# Patient Record
Sex: Female | Born: 1965 | Race: Asian
Health system: Southern US, Academic
[De-identification: ages and names within clinical notes are randomized; demographics above are authoritative.]

## PROBLEM LIST (undated history)

## (undated) ENCOUNTER — Encounter

## (undated) DIAGNOSIS — I10 Essential (primary) hypertension: Secondary | ICD-10-CM

## (undated) HISTORY — PX: VAGINAL WOUND CLOSURE / REPAIR: SUR258

## (undated) HISTORY — PX: EYE SURGERY: SHX253

---

## 1998-02-04 ENCOUNTER — Emergency Department (HOSPITAL_COMMUNITY): Admission: EM | Admit: 1998-02-04 | Discharge: 1998-02-04 | Payer: Self-pay | Admitting: Emergency Medicine

## 1998-07-29 ENCOUNTER — Other Ambulatory Visit: Admission: RE | Admit: 1998-07-29 | Discharge: 1998-07-29 | Payer: Self-pay | Admitting: Obstetrics

## 1999-07-19 ENCOUNTER — Emergency Department (HOSPITAL_COMMUNITY): Admission: EM | Admit: 1999-07-19 | Discharge: 1999-07-19 | Payer: Self-pay | Admitting: Emergency Medicine

## 2000-02-29 ENCOUNTER — Emergency Department (HOSPITAL_COMMUNITY): Admission: EM | Admit: 2000-02-29 | Discharge: 2000-02-29 | Payer: Self-pay | Admitting: Emergency Medicine

## 2000-03-18 ENCOUNTER — Encounter: Payer: Self-pay | Admitting: Emergency Medicine

## 2000-03-18 ENCOUNTER — Emergency Department (HOSPITAL_COMMUNITY): Admission: EM | Admit: 2000-03-18 | Discharge: 2000-03-18 | Payer: Self-pay | Admitting: Emergency Medicine

## 2000-06-13 ENCOUNTER — Emergency Department (HOSPITAL_COMMUNITY): Admission: EM | Admit: 2000-06-13 | Discharge: 2000-06-13 | Payer: Self-pay | Admitting: Emergency Medicine

## 2001-02-23 ENCOUNTER — Emergency Department (HOSPITAL_COMMUNITY): Admission: EM | Admit: 2001-02-23 | Discharge: 2001-02-23 | Payer: Self-pay | Admitting: *Deleted

## 2001-03-09 ENCOUNTER — Emergency Department (HOSPITAL_COMMUNITY): Admission: EM | Admit: 2001-03-09 | Discharge: 2001-03-09 | Payer: Self-pay | Admitting: *Deleted

## 2001-07-29 ENCOUNTER — Encounter: Payer: Self-pay | Admitting: Emergency Medicine

## 2001-07-29 ENCOUNTER — Emergency Department (HOSPITAL_COMMUNITY): Admission: EM | Admit: 2001-07-29 | Discharge: 2001-07-29 | Payer: Self-pay | Admitting: Emergency Medicine

## 2002-02-13 ENCOUNTER — Emergency Department (HOSPITAL_COMMUNITY): Admission: EM | Admit: 2002-02-13 | Discharge: 2002-02-13 | Payer: Self-pay | Admitting: Emergency Medicine

## 2002-02-18 ENCOUNTER — Emergency Department (HOSPITAL_COMMUNITY): Admission: EM | Admit: 2002-02-18 | Discharge: 2002-02-18 | Payer: Self-pay

## 2002-04-26 ENCOUNTER — Emergency Department (HOSPITAL_COMMUNITY): Admission: EM | Admit: 2002-04-26 | Discharge: 2002-04-26 | Payer: Self-pay | Admitting: Emergency Medicine

## 2002-08-24 ENCOUNTER — Emergency Department (HOSPITAL_COMMUNITY): Admission: EM | Admit: 2002-08-24 | Discharge: 2002-08-24 | Payer: Self-pay | Admitting: Emergency Medicine

## 2003-01-23 ENCOUNTER — Emergency Department (HOSPITAL_COMMUNITY): Admission: EM | Admit: 2003-01-23 | Discharge: 2003-01-23 | Payer: Self-pay | Admitting: Emergency Medicine

## 2003-04-18 ENCOUNTER — Inpatient Hospital Stay (HOSPITAL_COMMUNITY): Admission: AD | Admit: 2003-04-18 | Discharge: 2003-04-22 | Payer: Self-pay | Admitting: Obstetrics

## 2003-04-18 ENCOUNTER — Encounter: Payer: Self-pay | Admitting: Emergency Medicine

## 2003-04-19 ENCOUNTER — Encounter: Payer: Self-pay | Admitting: Obstetrics

## 2007-05-12 ENCOUNTER — Emergency Department (HOSPITAL_COMMUNITY): Admission: EM | Admit: 2007-05-12 | Discharge: 2007-05-12 | Payer: Self-pay | Admitting: Emergency Medicine

## 2007-06-21 ENCOUNTER — Emergency Department (HOSPITAL_COMMUNITY): Admission: EM | Admit: 2007-06-21 | Discharge: 2007-06-21 | Payer: Self-pay | Admitting: Emergency Medicine

## 2007-08-19 ENCOUNTER — Emergency Department (HOSPITAL_COMMUNITY): Admission: EM | Admit: 2007-08-19 | Discharge: 2007-08-19 | Payer: Self-pay | Admitting: Emergency Medicine

## 2008-01-05 ENCOUNTER — Emergency Department (HOSPITAL_COMMUNITY): Admission: EM | Admit: 2008-01-05 | Discharge: 2008-01-05 | Payer: Self-pay | Admitting: Emergency Medicine

## 2008-06-23 ENCOUNTER — Emergency Department (HOSPITAL_COMMUNITY): Admission: EM | Admit: 2008-06-23 | Discharge: 2008-06-23 | Payer: Self-pay | Admitting: Emergency Medicine

## 2008-06-27 ENCOUNTER — Emergency Department (HOSPITAL_COMMUNITY): Admission: EM | Admit: 2008-06-27 | Discharge: 2008-06-27 | Payer: Self-pay | Admitting: Emergency Medicine

## 2009-01-04 ENCOUNTER — Inpatient Hospital Stay (HOSPITAL_COMMUNITY): Admission: EM | Admit: 2009-01-04 | Discharge: 2009-01-06 | Payer: Self-pay | Admitting: Emergency Medicine

## 2009-02-07 ENCOUNTER — Emergency Department (HOSPITAL_COMMUNITY): Admission: EM | Admit: 2009-02-07 | Discharge: 2009-02-07 | Payer: Self-pay | Admitting: Emergency Medicine

## 2009-02-19 ENCOUNTER — Emergency Department (HOSPITAL_COMMUNITY): Admission: EM | Admit: 2009-02-19 | Discharge: 2009-02-19 | Payer: Self-pay | Admitting: Emergency Medicine

## 2009-02-28 ENCOUNTER — Emergency Department (HOSPITAL_COMMUNITY): Admission: EM | Admit: 2009-02-28 | Discharge: 2009-02-28 | Payer: Self-pay | Admitting: Emergency Medicine

## 2009-04-07 ENCOUNTER — Emergency Department (HOSPITAL_COMMUNITY): Admission: EM | Admit: 2009-04-07 | Discharge: 2009-04-07 | Payer: Self-pay | Admitting: Emergency Medicine

## 2009-05-03 ENCOUNTER — Inpatient Hospital Stay (HOSPITAL_COMMUNITY): Admission: EM | Admit: 2009-05-03 | Discharge: 2009-05-08 | Payer: Self-pay | Admitting: Emergency Medicine

## 2009-05-04 ENCOUNTER — Encounter (INDEPENDENT_AMBULATORY_CARE_PROVIDER_SITE_OTHER): Payer: Self-pay | Admitting: General Surgery

## 2009-07-10 ENCOUNTER — Emergency Department (HOSPITAL_COMMUNITY): Admission: EM | Admit: 2009-07-10 | Discharge: 2009-07-10 | Payer: Self-pay | Admitting: Emergency Medicine

## 2009-08-20 ENCOUNTER — Emergency Department (HOSPITAL_COMMUNITY): Admission: EM | Admit: 2009-08-20 | Discharge: 2009-08-20 | Payer: Self-pay | Admitting: Emergency Medicine

## 2009-09-08 ENCOUNTER — Emergency Department (HOSPITAL_COMMUNITY): Admission: EM | Admit: 2009-09-08 | Discharge: 2009-09-08 | Payer: Self-pay | Admitting: Emergency Medicine

## 2009-10-31 ENCOUNTER — Emergency Department (HOSPITAL_COMMUNITY): Admission: EM | Admit: 2009-10-31 | Discharge: 2009-10-31 | Payer: Self-pay | Admitting: Emergency Medicine

## 2009-11-29 ENCOUNTER — Emergency Department (HOSPITAL_COMMUNITY): Admission: EM | Admit: 2009-11-29 | Discharge: 2009-11-29 | Payer: Self-pay | Admitting: Emergency Medicine

## 2010-01-24 ENCOUNTER — Emergency Department (HOSPITAL_COMMUNITY): Admission: EM | Admit: 2010-01-24 | Discharge: 2010-01-24 | Payer: Self-pay | Admitting: Emergency Medicine

## 2010-03-17 ENCOUNTER — Emergency Department (HOSPITAL_COMMUNITY): Admission: EM | Admit: 2010-03-17 | Discharge: 2010-03-17 | Payer: Self-pay | Admitting: Emergency Medicine

## 2010-03-23 ENCOUNTER — Emergency Department (HOSPITAL_COMMUNITY): Admission: EM | Admit: 2010-03-23 | Discharge: 2010-03-24 | Payer: Self-pay | Admitting: Emergency Medicine

## 2010-04-01 ENCOUNTER — Emergency Department (HOSPITAL_COMMUNITY): Admission: EM | Admit: 2010-04-01 | Discharge: 2010-04-01 | Payer: Self-pay | Admitting: Emergency Medicine

## 2010-04-10 ENCOUNTER — Ambulatory Visit (HOSPITAL_COMMUNITY): Admission: RE | Admit: 2010-04-10 | Discharge: 2010-04-12 | Payer: Self-pay | Admitting: Otolaryngology

## 2010-04-23 ENCOUNTER — Emergency Department (HOSPITAL_COMMUNITY): Admission: EM | Admit: 2010-04-23 | Discharge: 2010-04-23 | Payer: Self-pay | Admitting: Emergency Medicine

## 2010-06-12 ENCOUNTER — Emergency Department (HOSPITAL_COMMUNITY): Admission: EM | Admit: 2010-06-12 | Discharge: 2010-06-12 | Payer: Self-pay | Admitting: Emergency Medicine

## 2010-08-30 ENCOUNTER — Emergency Department (HOSPITAL_COMMUNITY)
Admission: EM | Admit: 2010-08-30 | Discharge: 2010-08-30 | Payer: Self-pay | Source: Home / Self Care | Admitting: Emergency Medicine

## 2010-09-03 ENCOUNTER — Emergency Department (HOSPITAL_COMMUNITY)
Admission: EM | Admit: 2010-09-03 | Discharge: 2010-09-03 | Payer: Self-pay | Source: Home / Self Care | Admitting: Emergency Medicine

## 2010-09-20 ENCOUNTER — Emergency Department (HOSPITAL_COMMUNITY)
Admission: EM | Admit: 2010-09-20 | Discharge: 2010-09-20 | Payer: Self-pay | Source: Home / Self Care | Admitting: Emergency Medicine

## 2010-11-05 LAB — COMPREHENSIVE METABOLIC PANEL
AST: 21 U/L (ref 0–37)
Albumin: 3.5 g/dL (ref 3.5–5.2)
BUN: 7 mg/dL (ref 6–23)
Calcium: 8.6 mg/dL (ref 8.4–10.5)
Chloride: 111 mEq/L (ref 96–112)
Creatinine, Ser: 0.87 mg/dL (ref 0.4–1.2)
GFR calc Af Amer: >60 mL/min (ref >60)
Sodium: 138 mEq/L (ref 135–145)

## 2010-11-05 LAB — URINALYSIS, ROUTINE W REFLEX MICROSCOPIC
Bilirubin Urine: NEGATIVE
Ketones, ur: NEGATIVE mg/dL
Leukocytes, UA: NEGATIVE
Protein, ur: NEGATIVE mg/dL
Specific Gravity, Urine: 1.014 (ref 1.005–1.030)

## 2010-11-05 LAB — CBC
HCT: 27.4 % — ABNORMAL LOW (ref 36.0–46.0)
Hemoglobin: 8.8 g/dL — ABNORMAL LOW (ref 12.0–15.0)
MCHC: 32 g/dL (ref 30.0–36.0)
Platelets: 315 10*3/uL (ref 150–400)
RBC: 3.69 MIL/uL — ABNORMAL LOW (ref 3.87–5.11)
RDW: 20.5 % — ABNORMAL HIGH (ref 11.5–15.5)
WBC: 13.2 10*3/uL — ABNORMAL HIGH (ref 4.0–10.5)

## 2010-11-05 LAB — DIFFERENTIAL
Eosinophils Absolute: 0.2 10*3/uL (ref 0.0–0.7)
Eosinophils Relative: 1 % (ref 0–5)
Lymphocytes Relative: 19 % (ref 12–46)
Monocytes Absolute: 0.8 10*3/uL (ref 0.1–1.0)

## 2010-11-05 LAB — LIPASE, BLOOD: Lipase: 20 U/L (ref 11–59)

## 2010-11-05 LAB — URINE MICROSCOPIC-ADD ON

## 2010-11-06 LAB — DIFFERENTIAL
Band Neutrophils: 0 % (ref 0–10)
Basophils Absolute: 0 10*3/uL (ref 0.0–0.1)
Basophils Relative: 0 % (ref 0–1)
Blasts: 0 %
Eosinophils Absolute: 0.2 10*3/uL (ref 0.0–0.7)
Eosinophils Relative: 2 % (ref 0–5)
Lymphocytes Relative: 24 % (ref 12–46)
Lymphs Abs: 2.5 10*3/uL (ref 0.7–4.0)
Metamyelocytes Relative: 0 %
Monocytes Absolute: 0.7 10*3/uL (ref 0.1–1.0)
Monocytes Relative: 7 % (ref 3–12)
Myelocytes: 0 %
Neutro Abs: 7 10*3/uL (ref 1.7–7.7)
Neutrophils Relative %: 67 % (ref 43–77)
Promyelocytes Absolute: 0 %
nRBC: 0 /100 WBC

## 2010-11-06 LAB — GC/CHLAMYDIA PROBE AMP, GENITAL: GC Probe Amp, Genital: NEGATIVE

## 2010-11-06 LAB — BASIC METABOLIC PANEL
BUN: 10 mg/dL (ref 6–23)
CO2: 22 mEq/L (ref 19–32)
Calcium: 9.3 mg/dL (ref 8.4–10.5)
Chloride: 111 mEq/L (ref 96–112)
Creatinine, Ser: 0.89 mg/dL (ref 0.4–1.2)
GFR calc Af Amer: >60 mL/min (ref >60)
GFR calc non Af Amer: >60 mL/min (ref >60)
Glucose, Bld: 108 mg/dL — ABNORMAL HIGH (ref 70–99)
Potassium: 3.4 mEq/L — ABNORMAL LOW (ref 3.5–5.1)
Sodium: 140 mEq/L (ref 135–145)

## 2010-11-06 LAB — URINALYSIS, ROUTINE W REFLEX MICROSCOPIC
Glucose, UA: NEGATIVE mg/dL
Leukocytes, UA: NEGATIVE
Nitrite: NEGATIVE
Protein, ur: 30 mg/dL — AB
Specific Gravity, Urine: 1.03 (ref 1.005–1.030)
Urobilinogen, UA: 0.2 mg/dL (ref 0.0–1.0)
pH: 5.5 (ref 5.0–8.0)

## 2010-11-06 LAB — URINE MICROSCOPIC-ADD ON

## 2010-11-06 LAB — WET PREP, GENITAL
Trich, Wet Prep: NONE SEEN
Yeast Wet Prep HPF POC: NONE SEEN

## 2010-11-06 LAB — CBC
HCT: 32.1 % — ABNORMAL LOW (ref 36.0–46.0)
WBC: 10.4 10*3/uL (ref 4.0–10.5)

## 2010-11-07 LAB — BASIC METABOLIC PANEL
BUN: 6 mg/dL (ref 6–23)
Calcium: 9.3 mg/dL (ref 8.4–10.5)
Creatinine, Ser: 0.87 mg/dL (ref 0.4–1.2)
GFR calc non Af Amer: >60 mL/min (ref >60)
Glucose, Bld: 110 mg/dL — ABNORMAL HIGH (ref 70–99)
Sodium: 139 mEq/L (ref 135–145)

## 2010-11-07 LAB — SURGICAL PCR SCREEN
MRSA, PCR: NEGATIVE
Staphylococcus aureus: NEGATIVE

## 2010-11-07 LAB — CBC
MCHC: 31.4 g/dL (ref 30.0–36.0)
Platelets: 404 10*3/uL — ABNORMAL HIGH (ref 150–400)
RDW: 18.6 % — ABNORMAL HIGH (ref 11.5–15.5)

## 2010-11-08 LAB — URINE MICROSCOPIC-ADD ON

## 2010-11-08 LAB — URINALYSIS, ROUTINE W REFLEX MICROSCOPIC
Bilirubin Urine: NEGATIVE
Hgb urine dipstick: NEGATIVE
Specific Gravity, Urine: 1.018 (ref 1.005–1.030)
pH: 7.5 (ref 5.0–8.0)

## 2010-11-09 LAB — POCT CARDIAC MARKERS
CKMB, poc: 1 ng/mL (ref 1.0–8.0)
CKMB, poc: <1 ng/mL — ABNORMAL LOW (ref 1.0–8.0)
Myoglobin, poc: 42.6 ng/mL (ref 12–200)
Troponin i, poc: <0.05 ng/mL (ref 0.00–0.09)
Troponin i, poc: <0.05 ng/mL (ref 0.00–0.09)

## 2010-11-09 LAB — COMPREHENSIVE METABOLIC PANEL
ALT: 15 U/L (ref 0–35)
AST: 29 U/L (ref 0–37)
Alkaline Phosphatase: 66 U/L (ref 39–117)
CO2: 24 mEq/L (ref 19–32)
GFR calc Af Amer: >60 mL/min (ref >60)
Glucose, Bld: 94 mg/dL (ref 70–99)
Potassium: 4.2 mEq/L (ref 3.5–5.1)
Sodium: 134 mEq/L — ABNORMAL LOW (ref 135–145)
Total Protein: 7.7 g/dL (ref 6.0–8.3)

## 2010-11-09 LAB — DIFFERENTIAL
Basophils Relative: 0 % (ref 0–1)
Eosinophils Absolute: 0.1 10*3/uL (ref 0.0–0.7)
Lymphs Abs: 1.9 10*3/uL (ref 0.7–4.0)
Monocytes Absolute: 0.5 10*3/uL (ref 0.1–1.0)
Neutro Abs: 11 10*3/uL — ABNORMAL HIGH (ref 1.7–7.7)
Neutrophils Relative %: 81 % — ABNORMAL HIGH (ref 43–77)

## 2010-11-09 LAB — CBC
Hemoglobin: 9.8 g/dL — ABNORMAL LOW (ref 12.0–15.0)
RBC: 4.07 MIL/uL (ref 3.87–5.11)
RDW: 21.6 % — ABNORMAL HIGH (ref 11.5–15.5)

## 2010-11-09 LAB — D-DIMER, QUANTITATIVE: D-Dimer, Quant: 0.4 ug/mL-FEU (ref 0.00–0.48)

## 2010-11-26 LAB — GC/CHLAMYDIA PROBE AMP, GENITAL
Chlamydia, DNA Probe: NEGATIVE
GC Probe Amp, Genital: NEGATIVE

## 2010-11-26 LAB — WET PREP, GENITAL
Trich, Wet Prep: NONE SEEN
Yeast Wet Prep HPF POC: NONE SEEN

## 2010-11-28 LAB — CBC
HCT: 33.8 % — ABNORMAL LOW (ref 36.0–46.0)
Hemoglobin: 10.7 g/dL — ABNORMAL LOW (ref 12.0–15.0)
MCHC: 32 g/dL (ref 30.0–36.0)
MCV: 79.2 fL (ref 78.0–100.0)
Platelets: 278 10*3/uL (ref 150–400)
Platelets: 323 10*3/uL (ref 150–400)
RBC: 3.99 MIL/uL (ref 3.87–5.11)
RDW: 18.6 % — ABNORMAL HIGH (ref 11.5–15.5)
WBC: 10.9 10*3/uL — ABNORMAL HIGH (ref 4.0–10.5)
WBC: 11.6 10*3/uL — ABNORMAL HIGH (ref 4.0–10.5)

## 2010-11-28 LAB — ANAEROBIC CULTURE

## 2010-11-28 LAB — POCT I-STAT, CHEM 8
BUN: 10 mg/dL (ref 6–23)
Calcium, Ion: 1.14 mmol/L (ref 1.12–1.32)
Chloride: 109 mEq/L (ref 96–112)
HCT: 38 % (ref 36.0–46.0)
Sodium: 140 mEq/L (ref 135–145)
TCO2: 22 mmol/L (ref 0–100)

## 2010-11-28 LAB — DIFFERENTIAL
Basophils Absolute: 0.1 10*3/uL (ref 0.0–0.1)
Basophils Relative: 1 % (ref 0–1)
Lymphocytes Relative: 21 % (ref 12–46)
Monocytes Absolute: 1.2 10*3/uL — ABNORMAL HIGH (ref 0.1–1.0)
Neutro Abs: 13.2 10*3/uL — ABNORMAL HIGH (ref 1.7–7.7)

## 2010-11-28 LAB — CULTURE, ROUTINE-ABSCESS

## 2010-11-30 LAB — URINE CULTURE: Colony Count: >=100000

## 2010-11-30 LAB — URINALYSIS, ROUTINE W REFLEX MICROSCOPIC
Nitrite: NEGATIVE
Protein, ur: 30 mg/dL — AB

## 2010-12-01 LAB — CBC
MCHC: 32.7 g/dL (ref 30.0–36.0)
RBC: 4.36 MIL/uL (ref 3.87–5.11)
RDW: 16.2 % — ABNORMAL HIGH (ref 11.5–15.5)

## 2010-12-01 LAB — POCT I-STAT, CHEM 8
Creatinine, Ser: 1.1 mg/dL (ref 0.4–1.2)
Hemoglobin: 12.9 g/dL (ref 12.0–15.0)
Sodium: 141 mEq/L (ref 135–145)
TCO2: 19 mmol/L (ref 0–100)

## 2010-12-01 LAB — POCT PREGNANCY, URINE: Preg Test, Ur: NEGATIVE

## 2010-12-01 LAB — DIFFERENTIAL
Basophils Absolute: 0.2 10*3/uL — ABNORMAL HIGH (ref 0.0–0.1)
Basophils Relative: 1 % (ref 0–1)
Eosinophils Absolute: 0.2 10*3/uL (ref 0.0–0.7)
Lymphocytes Relative: 15 % (ref 12–46)
Neutrophils Relative %: 79 % — ABNORMAL HIGH (ref 43–77)

## 2010-12-01 LAB — URINALYSIS, ROUTINE W REFLEX MICROSCOPIC
Protein, ur: NEGATIVE mg/dL
Urobilinogen, UA: 0.2 mg/dL (ref 0.0–1.0)

## 2010-12-01 LAB — POCT CARDIAC MARKERS
CKMB, poc: 1.1 ng/mL (ref 1.0–8.0)
CKMB, poc: <1 ng/mL — ABNORMAL LOW (ref 1.0–8.0)
Myoglobin, poc: 56.1 ng/mL (ref 12–200)
Myoglobin, poc: 70 ng/mL (ref 12–200)
Troponin i, poc: <0.05 ng/mL (ref 0.00–0.09)
Troponin i, poc: <0.05 ng/mL (ref 0.00–0.09)

## 2010-12-01 LAB — D-DIMER, QUANTITATIVE
D-Dimer, Quant: 0.29 ug/mL-FEU (ref 0.00–0.48)
D-Dimer, Quant: 0.52 ug/mL-FEU — ABNORMAL HIGH (ref 0.00–0.48)

## 2010-12-01 LAB — URINE CULTURE: Colony Count: 3000

## 2010-12-01 LAB — BASIC METABOLIC PANEL
BUN: 7 mg/dL (ref 6–23)
Creatinine, Ser: 0.93 mg/dL (ref 0.4–1.2)
GFR calc non Af Amer: >60 mL/min (ref >60)

## 2010-12-01 LAB — URINE MICROSCOPIC-ADD ON

## 2010-12-01 LAB — PROTIME-INR: Prothrombin Time: 14.5 seconds (ref 11.6–15.2)

## 2010-12-02 LAB — CULTURE, ROUTINE-ABSCESS

## 2010-12-02 LAB — BASIC METABOLIC PANEL
BUN: 9 mg/dL (ref 6–23)
CO2: 23 mEq/L (ref 19–32)
Calcium: 8.8 mg/dL (ref 8.4–10.5)
Creatinine, Ser: 0.87 mg/dL (ref 0.4–1.2)
GFR calc Af Amer: >60 mL/min (ref >60)

## 2010-12-02 LAB — URINALYSIS, ROUTINE W REFLEX MICROSCOPIC
Glucose, UA: NEGATIVE mg/dL
Ketones, ur: NEGATIVE mg/dL
Nitrite: NEGATIVE
Protein, ur: NEGATIVE mg/dL
Urobilinogen, UA: 1 mg/dL (ref 0.0–1.0)

## 2010-12-02 LAB — CBC
MCHC: 32.6 g/dL (ref 30.0–36.0)
MCHC: 33.1 g/dL (ref 30.0–36.0)
MCV: 81.3 fL (ref 78.0–100.0)
Platelets: 256 10*3/uL (ref 150–400)
Platelets: 272 10*3/uL (ref 150–400)
RBC: 3.73 MIL/uL — ABNORMAL LOW (ref 3.87–5.11)
RBC: 3.94 MIL/uL (ref 3.87–5.11)
RDW: 17.8 % — ABNORMAL HIGH (ref 11.5–15.5)
WBC: 17.1 10*3/uL — ABNORMAL HIGH (ref 4.0–10.5)

## 2010-12-02 LAB — DIFFERENTIAL
Basophils Relative: 1 % (ref 0–1)
Eosinophils Absolute: 0.2 10*3/uL (ref 0.0–0.7)
Monocytes Relative: 5 % (ref 3–12)
Neutro Abs: 13.6 10*3/uL — ABNORMAL HIGH (ref 1.7–7.7)
Neutrophils Relative %: 80 % — ABNORMAL HIGH (ref 43–77)

## 2010-12-02 LAB — GRAM STAIN

## 2010-12-02 LAB — PREGNANCY, URINE: Preg Test, Ur: NEGATIVE

## 2010-12-02 LAB — GC/CHLAMYDIA PROBE AMP, URINE
Chlamydia, Swab/Urine, PCR: NEGATIVE
GC Probe Amp, Urine: NEGATIVE

## 2010-12-02 LAB — URINE MICROSCOPIC-ADD ON

## 2010-12-02 LAB — SAMPLE TO BLOOD BANK

## 2010-12-02 LAB — ANAEROBIC CULTURE

## 2010-12-10 ENCOUNTER — Emergency Department (HOSPITAL_COMMUNITY)
Admission: EM | Admit: 2010-12-10 | Discharge: 2010-12-10 | Disposition: A | Discharge status: Home / Self Care | Payer: Self-pay | Attending: Emergency Medicine | Admitting: Emergency Medicine

## 2010-12-10 DIAGNOSIS — R109 Unspecified abdominal pain: Secondary | ICD-10-CM | POA: Insufficient documentation

## 2010-12-10 DIAGNOSIS — I10 Essential (primary) hypertension: Secondary | ICD-10-CM | POA: Insufficient documentation

## 2010-12-10 DIAGNOSIS — R1110 Vomiting, unspecified: Secondary | ICD-10-CM | POA: Insufficient documentation

## 2010-12-10 DIAGNOSIS — K5289 Other specified noninfective gastroenteritis and colitis: Secondary | ICD-10-CM | POA: Insufficient documentation

## 2010-12-10 DIAGNOSIS — R197 Diarrhea, unspecified: Secondary | ICD-10-CM | POA: Insufficient documentation

## 2010-12-10 LAB — COMPREHENSIVE METABOLIC PANEL
ALT: 14 U/L (ref 0–35)
AST: 24 U/L (ref 0–37)
Albumin: 3.9 g/dL (ref 3.5–5.2)
Calcium: 8.7 mg/dL (ref 8.4–10.5)
Chloride: 110 mEq/L (ref 96–112)
Creatinine, Ser: 0.92 mg/dL (ref 0.4–1.2)
GFR calc Af Amer: >60 mL/min (ref >60)
Sodium: 143 mEq/L (ref 135–145)
Total Bilirubin: 0.4 mg/dL (ref 0.3–1.2)

## 2010-12-10 LAB — DIFFERENTIAL
Basophils Absolute: 0 10*3/uL (ref 0.0–0.1)
Basophils Relative: 0 % (ref 0–1)
Eosinophils Absolute: 0.1 10*3/uL (ref 0.0–0.7)
Monocytes Relative: 4 % (ref 3–12)
Neutro Abs: 9.1 10*3/uL — ABNORMAL HIGH (ref 1.7–7.7)
Neutrophils Relative %: 75 % (ref 43–77)

## 2010-12-10 LAB — CBC
Hemoglobin: 9.7 g/dL — ABNORMAL LOW (ref 12.0–15.0)
MCH: 23.4 pg — ABNORMAL LOW (ref 26.0–34.0)
Platelets: 321 10*3/uL (ref 150–400)
RBC: 4.15 MIL/uL (ref 3.87–5.11)

## 2011-01-02 ENCOUNTER — Emergency Department (HOSPITAL_COMMUNITY)
Admission: EM | Admit: 2011-01-02 | Discharge: 2011-01-02 | Disposition: A | Discharge status: Home / Self Care | Payer: Self-pay | Attending: Emergency Medicine | Admitting: Emergency Medicine

## 2011-01-02 DIAGNOSIS — K047 Periapical abscess without sinus: Secondary | ICD-10-CM | POA: Insufficient documentation

## 2011-01-02 DIAGNOSIS — K029 Dental caries, unspecified: Secondary | ICD-10-CM | POA: Insufficient documentation

## 2011-01-02 DIAGNOSIS — I10 Essential (primary) hypertension: Secondary | ICD-10-CM | POA: Insufficient documentation

## 2011-01-06 NOTE — Op Note (Signed)
NAMEKAYLONI, ROCCO                  ACCOUNT NO.:  [id number]   MEDICAL RECORD NO.:  [id number]          PATIENT TYPE:  INP   LOCATION:  0098                         FACILITY:  [facility]   PHYSICIAN:  Sharlet Salina T. Hoxworth, M.D.DATE OF BIRTH:  1966-07-10   DATE OF PROCEDURE:  01/04/2009  DATE OF DISCHARGE:  01/04/2009                               OPERATIVE REPORT   PREOPERATIVE DIAGNOSIS:  Perineal abscess.   POSTOPERATIVE DIAGNOSIS:  Perineal abscess.   SURGICAL PROCEDURES:  Incision and drainage of perineal abscess.   SURGEON:  Lorne Skeens. Hoxworth, M.D.   ANESTHESIA:  General.   BRIEF HISTORY:  Ms. Dutko is a 45 year old female who presents with a 4-  5 day history of increasing pain, swelling and tenderness in the left  perineum.  Physical exam and CT scan obtained from the emergency room  shows apparent large abscess just to the left of the vagina in the  perineum extending up into the labium on the left.  I have recommended  incision and drainage under general anesthesia in the operating room.  She is brought to the OR for this procedure.   DESCRIPTION OF OPERATION:  The patient was brought to the operating room  and placed in supine position on the operating table and general  anesthesia was induced.  She was carefully placed in lithotomy position.  The perineum widely sterilely prepped and draped.  She was already on  broad spectrum IV antibiotics preoperatively.  Correct patient and  procedure were verified.  Examination showed a large area of swelling  and fluctuance about 7 x 4 cm just to the left of vagina.  I made an  anterior posterior incision directly over this and entered a large  abscess cavity with foul-smelling purulent material.  This was cultured.  The abscess contents were completely evacuated and the incision was  extended somewhat for wide drainage.  There were a couple of loculations  up into the labia that were broken up.  The cavity was then thoroughly  irrigated.  Hemostasis was obtained with cautery.  The soft tissues were  infiltrated with Marcaine with epinephrine.  The cavity was then packed  with 1 inch Iodoform gauze.  Dry dressing was applied.  The patient  taken to recovery in good condition.      Lorne Skeens. Hoxworth, M.D.  Electronically Signed     BTH/MEDQ  D:  01/04/2009  T:  01/04/2009  Job:  [id number]

## 2011-01-06 NOTE — H&P (Signed)
NAMEHENDEL, GATLIFF                  ACCOUNT NO.:  [id number]   MEDICAL RECORD NO.:  [id number]          PATIENT TYPE:  INP   LOCATION:  0098                         FACILITY:  [facility]   PHYSICIAN:  Sharlet Salina T. Hoxworth, M.D.DATE OF BIRTH:  [birth year]   DATE OF ADMISSION:  01/04/2009  DATE OF DISCHARGE:  01/04/2009                              HISTORY & PHYSICAL   POSTOPERATIVE DIAGNOSIS:  Perineal abscess.   POSTOPERATIVE DIAGNOSIS:  Perineal abscess.   SURGICAL PROCEDURES:  Incision and drainage of perineal abscess.   SURGEON:  Lorne Skeens. Hoxworth, M.D.   ANESTHESIA:  General.   BRIEF HISTORY:  Leslie Golden is a 45 year old female generally in good  health except for hypertension.  She has a history of an apparent  superficial abscess in the left perineum drained in the emergency room  in October.  She had done well until about 4-5 days ago at which point  she has developed increasing pain and swelling just to the left of her  vagina.  She presents to the emergency room.  Denies fever or chills or  abdominal complaints.   PAST MEDICAL HISTORY:  Surgery only for the minor I and D as above.  Medically she has hypertension and has been prescribed propranolol but  does not take it due to the cost.   ALLERGIES:  None.   SOCIAL HISTORY:  She is employed as a Solicitor.  Smokes a pack of  cigarettes per day, drinks alcohol occasionally, single.   FAMILY HISTORY:  Noncontributory.   REVIEW OF SYSTEMS:  GENERAL:  No fever, chills or weight change.  RESPIRATORY:  Denies shortness of breath, cough, wheezing.  CARDIAC:  Denies chest pain, palpitations or history of heart disease.  GI:  No  abdominal pain, nausea, vomiting, problem with bowel movements.   PHYSICAL EXAM:  VITAL SIGNS:  Temperature is 97.1, pulse 90,  respirations 16, blood pressure 160/90.  GENERAL:  Well-developed African American female in no acute distress.  SKIN:  Warm and dry.  No rash or infection.  See GU.  HEENT:   No palpable mass or thyromegaly.  Sclerae nonicteric.  Oropharynx clear.  LYMPH NODES:  No cervical, subclavicular or inguinal nodes palpable.  LUNGS:  Clear without wheezing or increased work of breathing.  CARDIAC:  Regular rate and rhythm.  No murmurs.  ABDOMEN:  Soft, nontender, no masses.  GU:  Just to the left of the vagina in the perineum and extending up  into the left labium is a large about 6 x 4 cm area of swelling, marked  tenderness, mild erythema and fluctuance.  RECTOVAGINAL:  Exam not performed due to pain.  EXTREMITIES:  No joint swelling or deformity.  NEUROLOGIC:  Alert, oriented.  Motor and sensory exams grossly normal.   LABORATORY:  White count elevated at 17.1 thousand, hemoglobin 10.5.   CT scan of the pelvis was obtained by the emergency room which shows a  complex left perineal abscess.   ASSESSMENT AND PLAN:  Large recurrent left perineal abscess.  I have  recommended an incision and drainage in the operating  room under general  anesthesia.  She will receive broad spectrum preoperative IV  antibiotics.      Lorne Skeens. Hoxworth, M.D.  Electronically Signed     BTH/MEDQ  D:  01/04/2009  T:  01/04/2009  Job:  [id number]

## 2011-01-09 NOTE — Discharge Summary (Signed)
   NAME:  Leslie Golden, Leslie Golden                            ACCOUNT NO.:  [id number]   MEDICAL RECORD NO.:  [id number]                   PATIENT TYPE:  INP   LOCATION:  9144                                 FACILITY:  [facility]   PHYSICIAN:  Kathreen Cosier, M.D.           DATE OF BIRTH:  Nov 22, 1965   DATE OF ADMISSION:  04/18/2003  DATE OF DISCHARGE:  04/22/2003                                 DISCHARGE SUMMARY   HOSPITAL COURSE:  The patient is a 45 year old gravida 4, para 4-0-0-4 had a  tubal ligation in the past, last menstrual period April 02, 2003.  The  patient states that since Friday prior to admission she had pain which  started in her left lower quadrant and then spread all over her abdomen.  She had one episode of vomiting and diarrhea.  No bloody stools.  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  Her temp when she was  admitted here was 101 and her abdomen was extremely tender with guarding in  all quadrants.  She was started on ampicillin, gentamicin and she kept on  spiking temp up to 102.  Only thing positive was her urine.  Blood cultures  were done which were negative.  Her white count on admission was 29, her  hemoglobin 9.1.  Repeat white count two days later was 15 and on August 28th  it was 8.3.  She was seen in consult by the general surgeon who thought she  may have just had a bad case of pyelonephritis.  Her blood cultures were  negative.  Her ultrasound abdomen and pelvis showed a normal abdomen and  normal pelvis.  GC, Chlamydia, CLOtest were reported as being negative.  Cleocin was added to her antibiotic regimen on the 27th and then she rapidly  defervesced and by the 29th she had been without fever for greater than two  days.  Her abdomen was soft and she had no complaints.  She was discharged  home on April 22, 2003 on ampicillin 500 mg p.o. q.6 h. for 5 days, Motrin  for pain,  and Nizoral cream for fungal infection.   DISCHARGE DIAGNOSIS:  Status post hospitalization for pyelonephritis.                                               Kathreen Cosier, M.D.    BAM/MEDQ  D:  04/22/2003  T:  04/22/2003  Job:  [id number]

## 2011-01-09 NOTE — Consult Note (Signed)
[facility]  Patient:    Leslie Golden, Leslie Golden Visit Number: [id number] MRN: [id number]          Service Type: EMS Location: ED Attending Physician:  Armanda Heritage Dictated by:   Excell Seltzer. Annabell Howells, M.D. Proc. Date: 02/18/02 Admit Date:  02/18/2002 Discharge Date: 02/18/2002   CC:         Oswaldo Done K. Beverely Pace, M.D.  Director of Nursing, [facility]   Consultation Report  REFERRING PHYSICIAN:  Dr. Smitty Cords. Cheek.  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 Beverely Pace.  He discussed the case with me and I suggested she come to the office for followup, since I felt it would be best that the diverticulum be treated with antibiotics before considering surgery.  She had not had her appointment yet and came back in today with increased pain.  Dr. Beverely Pace contacted me and I came in to review the situation.  The patient reports onset earlier in the week of severe vaginal pain associated with some bloody purulent discharge.  She reported she had no problems urinating and no dysuria.  She had been on her period at the beginning of this and was unable to place a tampon.  ALLERGIES:  On review of her past history, she has no medication allergies.  MEDICATIONS:  She is only on pain medication and antibiotics from Dr. Beverely Pace, Vicodin and Levaquin.  PAST MEDICAL HISTORY:  She has had four pregnancies and four vaginal deliveries.  Medical history is otherwise unremarkable.  SOCIAL HISTORY:  She smokes, uses occasional alcohol.  Denies recreational drugs.  FAMILY HISTORY:  Unremarkable.  REVIEW OF SYSTEMS:  She denies fever or chills, abdominal pain or any urinary difficulties.  PHYSICAL EXAMINATION:  GENERAL:  She is a well-developed, thin black female in moderate distress, somewhat agitated with the  pain.  LUNGS:  Clear with normal effort.  HEART:  Somewhat tachycardic.  ABDOMEN:  Soft, flat and nontender without mass, lesion, hepatosplenomegaly or CVA tenderness.  No herniae or inguinal adenopathy are noted.  GU:  Exam reveals a protuberant suburethral mass which is exquisitely tender. I am not able to readily identify the urethral meatus but I believe it is distorted somewhat anteriorly and she was very uncomfortable with the exam.  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  PLAN:  I initially wrote an order for her to get some Dilaudid IV; prior order had been written for IM but I felt since she may require additional medication other than the one dose, that an IV would be more appropriate, and then recommended that we I&D this abscess.  The IV was placed using a 22 Angiocath in the left hand by the nurse.  The IV initially appeared to run slowly, the Dilaudid was injected, the skin was noted to bulge and it was felt the IV had infiltrated.  At this point, the decision was made to go ahead and do the IM injection, however, when we returned to perform the IM injection, the patient became quite agitated and abusive and stated that she was not going to let us touch her and began to make disparaging remarks about the nurse and myself and started using profanity, said she wanted to leave the hospital.  At this point, since she was stable, was in pain but otherwise had stable  vital signs and was not febrile, I had no cause to counter her decision.  I did explain to her that because of her behavior that I would not be willing to see her in any capacity and that she would need to go to [facility] for further evaluation if she were not content with our services.  The nurse I mentioned was Delia Heady, whose behavior was entirely appropriate throughout the patient contact. Dictated by:   Excell Seltzer. Annabell Howells, M.D. Attending Physician:  Armanda Heritage DD:  02/18/02 TD:  02/20/02 Job: [id number]

## 2011-02-14 ENCOUNTER — Emergency Department (HOSPITAL_COMMUNITY)
Admission: EM | Admit: 2011-02-14 | Discharge: 2011-02-14 | Disposition: A | Discharge status: Home / Self Care | Payer: Self-pay | Attending: Emergency Medicine | Admitting: Emergency Medicine

## 2011-02-14 DIAGNOSIS — R6883 Chills (without fever): Secondary | ICD-10-CM | POA: Insufficient documentation

## 2011-02-14 DIAGNOSIS — R220 Localized swelling, mass and lump, head: Secondary | ICD-10-CM | POA: Insufficient documentation

## 2011-02-14 DIAGNOSIS — R070 Pain in throat: Secondary | ICD-10-CM | POA: Insufficient documentation

## 2011-02-14 DIAGNOSIS — I10 Essential (primary) hypertension: Secondary | ICD-10-CM | POA: Insufficient documentation

## 2011-02-14 DIAGNOSIS — M542 Cervicalgia: Secondary | ICD-10-CM | POA: Insufficient documentation

## 2011-02-14 DIAGNOSIS — Z79899 Other long term (current) drug therapy: Secondary | ICD-10-CM | POA: Insufficient documentation

## 2011-02-14 DIAGNOSIS — H9209 Otalgia, unspecified ear: Secondary | ICD-10-CM | POA: Insufficient documentation

## 2011-02-14 DIAGNOSIS — R599 Enlarged lymph nodes, unspecified: Secondary | ICD-10-CM | POA: Insufficient documentation

## 2011-02-14 DIAGNOSIS — F319 Bipolar disorder, unspecified: Secondary | ICD-10-CM | POA: Insufficient documentation

## 2011-03-06 ENCOUNTER — Emergency Department (HOSPITAL_COMMUNITY)
Admission: EM | Admit: 2011-03-06 | Discharge: 2011-03-07 | Disposition: A | Discharge status: Home / Self Care | Payer: Self-pay | Attending: Emergency Medicine | Admitting: Emergency Medicine

## 2011-03-06 ENCOUNTER — Emergency Department (HOSPITAL_COMMUNITY): Payer: Self-pay

## 2011-03-06 DIAGNOSIS — K5289 Other specified noninfective gastroenteritis and colitis: Secondary | ICD-10-CM | POA: Insufficient documentation

## 2011-03-06 DIAGNOSIS — R1032 Left lower quadrant pain: Secondary | ICD-10-CM | POA: Insufficient documentation

## 2011-03-06 LAB — CBC
HCT: 29.4 % — ABNORMAL LOW (ref 36.0–46.0)
Hemoglobin: 9.3 g/dL — ABNORMAL LOW (ref 12.0–15.0)
MCH: 23.6 pg — ABNORMAL LOW (ref 26.0–34.0)
MCV: 74.6 fL — ABNORMAL LOW (ref 78.0–100.0)
RBC: 3.94 MIL/uL (ref 3.87–5.11)

## 2011-03-06 LAB — URINALYSIS, ROUTINE W REFLEX MICROSCOPIC
Glucose, UA: NEGATIVE mg/dL
Hgb urine dipstick: NEGATIVE
Leukocytes, UA: NEGATIVE
pH: 5.5 (ref 5.0–8.0)

## 2011-03-06 LAB — POCT I-STAT, CHEM 8
BUN: 9 mg/dL (ref 6–23)
Chloride: 108 mEq/L (ref 96–112)
Potassium: 3.5 mEq/L (ref 3.5–5.1)
Sodium: 140 mEq/L (ref 135–145)

## 2011-03-06 LAB — DIFFERENTIAL
Basophils Relative: 0 % (ref 0–1)
Lymphocytes Relative: 29 % (ref 12–46)
Lymphs Abs: 3.6 10*3/uL (ref 0.7–4.0)
Monocytes Relative: 7 % (ref 3–12)
Neutro Abs: 7.6 10*3/uL (ref 1.7–7.7)
Neutrophils Relative %: 62 % (ref 43–77)

## 2011-03-07 MED ORDER — IOHEXOL 300 MG/ML  SOLN
125.0000 mL | Freq: Once | INTRAMUSCULAR | Status: AC | PRN
  Administered 2011-03-07: 125 mL via INTRAVENOUS

## 2011-03-29 ENCOUNTER — Emergency Department (HOSPITAL_COMMUNITY)
Admission: EM | Admit: 2011-03-29 | Discharge: 2011-03-29 | Disposition: A | Discharge status: Home / Self Care | Payer: Self-pay | Attending: Emergency Medicine | Admitting: Emergency Medicine

## 2011-03-29 DIAGNOSIS — I10 Essential (primary) hypertension: Secondary | ICD-10-CM | POA: Insufficient documentation

## 2011-03-29 DIAGNOSIS — F319 Bipolar disorder, unspecified: Secondary | ICD-10-CM | POA: Insufficient documentation

## 2011-03-29 DIAGNOSIS — M25579 Pain in unspecified ankle and joints of unspecified foot: Secondary | ICD-10-CM | POA: Insufficient documentation

## 2011-03-29 DIAGNOSIS — M79609 Pain in unspecified limb: Secondary | ICD-10-CM | POA: Insufficient documentation

## 2011-03-29 DIAGNOSIS — Z79899 Other long term (current) drug therapy: Secondary | ICD-10-CM | POA: Insufficient documentation

## 2011-05-20 LAB — RAPID STREP SCREEN (MED CTR MEBANE ONLY): Streptococcus, Group A Screen (Direct): NEGATIVE

## 2011-06-26 ENCOUNTER — Emergency Department (HOSPITAL_COMMUNITY)
Admission: EM | Admit: 2011-06-26 | Discharge: 2011-06-26 | Disposition: A | Discharge status: Home / Self Care | Payer: Self-pay | Attending: Emergency Medicine | Admitting: Emergency Medicine

## 2011-06-26 ENCOUNTER — Emergency Department (HOSPITAL_COMMUNITY): Payer: Self-pay

## 2011-06-26 DIAGNOSIS — J322 Chronic ethmoidal sinusitis: Secondary | ICD-10-CM | POA: Insufficient documentation

## 2011-06-26 DIAGNOSIS — I10 Essential (primary) hypertension: Secondary | ICD-10-CM | POA: Insufficient documentation

## 2011-06-26 DIAGNOSIS — G43909 Migraine, unspecified, not intractable, without status migrainosus: Secondary | ICD-10-CM | POA: Insufficient documentation

## 2011-06-26 LAB — DIFFERENTIAL
Eosinophils Relative: 1 % (ref 0–5)
Lymphocytes Relative: 22 % (ref 12–46)
Lymphs Abs: 2.7 10*3/uL (ref 0.7–4.0)

## 2011-06-26 LAB — BASIC METABOLIC PANEL
BUN: 10 mg/dL (ref 6–23)
CO2: 23 mEq/L (ref 19–32)
Chloride: 101 mEq/L (ref 96–112)
Creatinine, Ser: 0.84 mg/dL (ref 0.50–1.10)
GFR calc Af Amer: >90 mL/min (ref >90)

## 2011-06-26 LAB — CBC
HCT: 33.7 % — ABNORMAL LOW (ref 36.0–46.0)
MCV: 74.7 fL — ABNORMAL LOW (ref 78.0–100.0)
RDW: 19.3 % — ABNORMAL HIGH (ref 11.5–15.5)
WBC: 12.5 10*3/uL — ABNORMAL HIGH (ref 4.0–10.5)

## 2011-08-03 ENCOUNTER — Encounter: Payer: Self-pay | Admitting: *Deleted

## 2011-08-03 ENCOUNTER — Emergency Department (HOSPITAL_COMMUNITY)
Admission: EM | Admit: 2011-08-03 | Discharge: 2011-08-03 | Discharge status: Against Medical Advice | Payer: Self-pay | Attending: Emergency Medicine | Admitting: Emergency Medicine

## 2011-08-03 DIAGNOSIS — M549 Dorsalgia, unspecified: Secondary | ICD-10-CM | POA: Insufficient documentation

## 2011-08-03 HISTORY — DX: Essential (primary) hypertension: I10

## 2011-08-04 ENCOUNTER — Emergency Department (HOSPITAL_COMMUNITY)
Admission: EM | Admit: 2011-08-04 | Discharge: 2011-08-04 | Disposition: A | Discharge status: Home / Self Care | Payer: Self-pay | Attending: Emergency Medicine | Admitting: Emergency Medicine

## 2011-08-04 ENCOUNTER — Encounter (HOSPITAL_COMMUNITY): Payer: Self-pay | Admitting: *Deleted

## 2011-08-04 DIAGNOSIS — R1110 Vomiting, unspecified: Secondary | ICD-10-CM | POA: Insufficient documentation

## 2011-08-04 DIAGNOSIS — I10 Essential (primary) hypertension: Secondary | ICD-10-CM | POA: Insufficient documentation

## 2011-08-04 DIAGNOSIS — M5380 Other specified dorsopathies, site unspecified: Secondary | ICD-10-CM | POA: Insufficient documentation

## 2011-08-04 DIAGNOSIS — M62830 Muscle spasm of back: Secondary | ICD-10-CM

## 2011-08-04 MED ORDER — DIAZEPAM 5 MG PO TABS
ORAL_TABLET | ORAL | Status: AC
  Filled 2011-08-04: qty 1

## 2011-08-04 MED ORDER — DIAZEPAM 2 MG PO TABS
2.0000 mg | ORAL_TABLET | Freq: Once | ORAL | Status: AC
  Administered 2011-08-04: 2 mg via ORAL

## 2011-08-04 MED ORDER — OXYCODONE-ACETAMINOPHEN 5-325 MG PO TABS
1.0000 | ORAL_TABLET | Freq: Once | ORAL | Status: AC
  Administered 2011-08-04: 1 via ORAL
  Filled 2011-08-04: qty 1

## 2011-08-04 MED ORDER — CYCLOBENZAPRINE HCL 10 MG PO TABS
5.0000 mg | ORAL_TABLET | Freq: Once | ORAL | Status: AC
  Administered 2011-08-04: 10 mg via ORAL
  Filled 2011-08-04: qty 1

## 2011-08-04 MED ORDER — KETOROLAC TROMETHAMINE 60 MG/2ML IM SOLN
60.0000 mg | Freq: Once | INTRAMUSCULAR | Status: AC
  Administered 2011-08-04: 60 mg via INTRAMUSCULAR
  Filled 2011-08-04: qty 2

## 2011-08-04 MED ORDER — METHOCARBAMOL 500 MG PO TABS: 500.0000 mg | ORAL_TABLET | Freq: Two times a day (BID) | ORAL | Status: AC

## 2011-08-04 MED ORDER — OXYCODONE-ACETAMINOPHEN 5-325 MG PO TABS: 1.0000 | ORAL_TABLET | Freq: Four times a day (QID) | ORAL | Status: AC | PRN

## 2011-08-04 NOTE — ED Provider Notes (Signed)
Medical screening examination/treatment/procedure(s) were performed by non-physician practitioner and as supervising physician I was immediately available for consultation/collaboration.  Juliet Rude. Rubin Payor, MD 08/04/11 [phone]

## 2011-08-04 NOTE — ED Notes (Signed)
Pt reports mid and low back spasms since last week, was here last night but not seen due to wait. Has only taken 800mg  ibuprofen at home.

## 2011-08-04 NOTE — ED Provider Notes (Signed)
History     CSN: [id number] Arrival date & time: 08/04/2011 11:17 AM   First MD Initiated Contact with Patient 08/04/11 1231      Chief Complaint  Patient presents with  . Spasms  . Muscle Pain    (Consider location/radiation/quality/duration/timing/severity/associated sxs/prior treatment) Patient is a 45 y.o. female presenting with musculoskeletal pain. The history is provided by the patient.  Muscle Pain This is a new problem. The current episode started in the past 7 days. The problem occurs constantly. The problem has been unchanged. Associated symptoms include vomiting. Pertinent negatives include no abdominal pain, anorexia, arthralgias, chest pain, chills, coughing, diaphoresis, fever, joint swelling or rash. The symptoms are aggravated by coughing, twisting and walking. She has tried acetaminophen and NSAIDs for the symptoms. The treatment provided mild relief.   Pt has a history of back spasms and has been having spasms that go from her right side then back to her left side alternating. She came to ED yesterday but left due to the wait time. Pt is ambulatory and denies any loose of motor function or sensory function.    Past Medical History  Diagnosis Date  . Hypertension     History reviewed. No pertinent past surgical history.  No family history on file.  History  Substance Use Topics  . Smoking status: Current Everyday Smoker    Types: Cigarettes  . Smokeless tobacco: Not on file  . Alcohol Use: Yes     occ    OB History    Grav Para Term Preterm Abortions TAB SAB Ect Mult Living                  Review of Systems  Constitutional: Negative for fever, chills and diaphoresis.  Respiratory: Negative for cough.   Cardiovascular: Negative for chest pain.  Gastrointestinal: Positive for vomiting. Negative for abdominal pain and anorexia.  Musculoskeletal: Negative for joint swelling and arthralgias.  Skin: Negative for rash.  All other systems reviewed and  are negative.    Allergies  Review of patient's allergies indicates no known allergies.  Home Medications   Current Outpatient Rx  Name Route Sig Dispense Refill  . IBUPROFEN 200 MG PO TABS Oral Take 800 mg by mouth every 6 (six) hours as needed. For pain.     Marland Kitchen MIRTAZAPINE 15 MG PO TABS Oral Take 15 mg by mouth at bedtime.      Carma Leaven M PLUS PO TABS Oral Take 1 tablet by mouth daily.      Marland Kitchen PROPRANOLOL HCL 20 MG PO TABS Oral Take 20 mg by mouth 2 (two) times daily.        BP 166/104  Pulse 74  Temp(Src) 98.4 F (36.9 C) (Oral)  Resp 16  SpO2 100%  LMP 07/23/2011  Physical Exam  Nursing note and vitals reviewed. Constitutional: She appears well-developed and well-nourished.  HENT:  Head: Normocephalic and atraumatic.  Eyes: Conjunctivae are normal. Pupils are equal, round, and reactive to light.  Neck: Trachea normal, normal range of motion and full passive range of motion without pain. Neck supple.  Cardiovascular: Normal rate, regular rhythm and normal pulses.   Pulmonary/Chest: Effort normal and breath sounds normal. Chest wall is not dull to percussion. She exhibits no tenderness, no crepitus, no edema, no deformity and no retraction.  Abdominal: Normal appearance.  Musculoskeletal:       Arms: Neurological: She is alert. She has normal strength.  Skin: Skin is warm, dry and intact.  Psychiatric:  She has a normal mood and affect. Her speech is normal and behavior is normal. Judgment and thought content normal. Cognition and memory are normal.    ED Course  Procedures (including critical care time)  Labs Reviewed - No data to display No results found.   1. Muscle spasm of back       MDM  pts pain resolved with treatment in the ED. Discussed treatment plan with pt and she is agreeable.        Dorthula Matas, PA 08/04/11 1359

## 2011-08-06 NOTE — ED Notes (Signed)
Pt. Called wanting to have work note changed. Continues to have back spasms. Chart taken to [employer] PA. She stated that pt needs to follow up this orthopedics as stated in DC papers. If can not go to ortho may return to [facility] or ED. No changes made to work note.

## 2011-08-26 ENCOUNTER — Encounter (HOSPITAL_COMMUNITY): Payer: Self-pay | Admitting: *Deleted

## 2011-08-26 ENCOUNTER — Emergency Department (HOSPITAL_COMMUNITY)
Admission: EM | Admit: 2011-08-26 | Discharge: 2011-08-26 | Disposition: A | Discharge status: Home / Self Care | Payer: Self-pay | Attending: Emergency Medicine | Admitting: Emergency Medicine

## 2011-08-26 DIAGNOSIS — M549 Dorsalgia, unspecified: Secondary | ICD-10-CM

## 2011-08-26 DIAGNOSIS — M79609 Pain in unspecified limb: Secondary | ICD-10-CM | POA: Insufficient documentation

## 2011-08-26 DIAGNOSIS — M545 Low back pain, unspecified: Secondary | ICD-10-CM | POA: Insufficient documentation

## 2011-08-26 DIAGNOSIS — Z79899 Other long term (current) drug therapy: Secondary | ICD-10-CM | POA: Insufficient documentation

## 2011-08-26 DIAGNOSIS — I10 Essential (primary) hypertension: Secondary | ICD-10-CM | POA: Insufficient documentation

## 2011-08-26 MED ORDER — CYCLOBENZAPRINE HCL 10 MG PO TABS: 10.0000 mg | ORAL_TABLET | Freq: Three times a day (TID) | ORAL | Status: AC | PRN

## 2011-08-26 MED ORDER — IBUPROFEN 800 MG PO TABS: 800.0000 mg | ORAL_TABLET | Freq: Three times a day (TID) | ORAL | Status: AC

## 2011-08-26 MED ORDER — CYCLOBENZAPRINE HCL 10 MG PO TABS
10.0000 mg | ORAL_TABLET | Freq: Once | ORAL | Status: DC
  Filled 2011-08-26: qty 1

## 2011-08-26 NOTE — ED Notes (Signed)
Pt wanted name of dr that saw her
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
## 2011-08-26 NOTE — ED Notes (Signed)
Pt refused flexeril

## 2011-08-26 NOTE — ED Notes (Signed)
Patient states she woke up with back spasms 2 nights ago.  She continues to have pain.  She states her pain is worse at times.

## 2011-08-28 NOTE — ED Provider Notes (Signed)
Medical screening examination/treatment/procedure(s) were performed by non-physician practitioner and as supervising physician I was immediately available for consultation/collaboration.   Forbes Cellar, MD 08/28/11 [phone]

## 2011-11-05 ENCOUNTER — Encounter (HOSPITAL_COMMUNITY): Payer: Self-pay

## 2011-11-05 ENCOUNTER — Emergency Department (HOSPITAL_COMMUNITY)
Admission: EM | Admit: 2011-11-05 | Discharge: 2011-11-05 | Disposition: A | Discharge status: Home / Self Care | Payer: Self-pay | Attending: Emergency Medicine | Admitting: Emergency Medicine

## 2011-11-05 DIAGNOSIS — R1110 Vomiting, unspecified: Secondary | ICD-10-CM | POA: Insufficient documentation

## 2011-11-05 DIAGNOSIS — R197 Diarrhea, unspecified: Secondary | ICD-10-CM | POA: Insufficient documentation

## 2011-11-05 DIAGNOSIS — K5289 Other specified noninfective gastroenteritis and colitis: Secondary | ICD-10-CM | POA: Insufficient documentation

## 2011-11-05 DIAGNOSIS — F17200 Nicotine dependence, unspecified, uncomplicated: Secondary | ICD-10-CM | POA: Insufficient documentation

## 2011-11-05 DIAGNOSIS — K529 Noninfective gastroenteritis and colitis, unspecified: Secondary | ICD-10-CM

## 2011-11-05 DIAGNOSIS — R109 Unspecified abdominal pain: Secondary | ICD-10-CM | POA: Insufficient documentation

## 2011-11-05 DIAGNOSIS — I10 Essential (primary) hypertension: Secondary | ICD-10-CM | POA: Insufficient documentation

## 2011-11-05 LAB — COMPREHENSIVE METABOLIC PANEL
ALT: 9 U/L (ref 0–35)
Alkaline Phosphatase: 68 U/L (ref 39–117)
BUN: 9 mg/dL (ref 6–23)
CO2: 26 mEq/L (ref 19–32)
GFR calc Af Amer: >90 mL/min (ref >90)
GFR calc non Af Amer: >90 mL/min (ref >90)
Glucose, Bld: 84 mg/dL (ref 70–99)
Potassium: 3.8 mEq/L (ref 3.5–5.1)
Sodium: 138 mEq/L (ref 135–145)
Total Bilirubin: 0.2 mg/dL — ABNORMAL LOW (ref 0.3–1.2)

## 2011-11-05 LAB — DIFFERENTIAL
Eosinophils Relative: 1 % (ref 0–5)
Lymphocytes Relative: 24 % (ref 12–46)
Lymphs Abs: 2.5 10*3/uL (ref 0.7–4.0)
Monocytes Relative: 7 % (ref 3–12)
Neutrophils Relative %: 67 % (ref 43–77)

## 2011-11-05 LAB — URINALYSIS, ROUTINE W REFLEX MICROSCOPIC
Bilirubin Urine: NEGATIVE
Hgb urine dipstick: NEGATIVE
Ketones, ur: NEGATIVE mg/dL
Protein, ur: NEGATIVE mg/dL
Urobilinogen, UA: 0.2 mg/dL (ref 0.0–1.0)

## 2011-11-05 LAB — CBC
Hemoglobin: 9.8 g/dL — ABNORMAL LOW (ref 12.0–15.0)
MCH: 23.7 pg — ABNORMAL LOW (ref 26.0–34.0)
MCV: 73.2 fL — ABNORMAL LOW (ref 78.0–100.0)
Platelets: 309 10*3/uL (ref 150–400)
RBC: 4.14 MIL/uL (ref 3.87–5.11)
WBC: 10.4 10*3/uL (ref 4.0–10.5)

## 2011-11-05 LAB — LIPASE, BLOOD: Lipase: 26 U/L (ref 11–59)

## 2011-11-05 MED ORDER — ONDANSETRON HCL 4 MG/2ML IJ SOLN
4.0000 mg | Freq: Once | INTRAMUSCULAR | Status: AC
  Administered 2011-11-05: 4 mg via INTRAVENOUS
  Filled 2011-11-05: qty 2

## 2011-11-05 MED ORDER — PROMETHAZINE HCL 25 MG PO TABS: 25.0000 mg | ORAL_TABLET | Freq: Four times a day (QID) | ORAL | Status: DC | PRN

## 2011-11-05 MED ORDER — SODIUM CHLORIDE 0.9 % IV BOLUS (SEPSIS)
1000.0000 mL | Freq: Once | INTRAVENOUS | Status: AC
  Administered 2011-11-05: 1000 mL via INTRAVENOUS

## 2011-11-05 NOTE — ED Notes (Signed)
Pt c/o n/v/d x2 days with abdominal cramping

## 2011-11-05 NOTE — Discharge Instructions (Signed)
B.R.A.T. Diet Your doctor has recommended the B.R.A.T. diet for you or your child until the condition improves. This is often used to help control diarrhea and vomiting symptoms. If you or your child can tolerate clear liquids, you may have:  Bananas.   Rice.   Applesauce.   Toast (and other simple starches such as crackers, potatoes, noodles).  Be sure to avoid dairy products, meats, and fatty foods until symptoms are better. Fruit juices such as apple, grape, and prune juice can make diarrhea worse. Avoid these. Continue this diet for 2 days or as instructed by your caregiver. Document Released: 08/10/2005 Document Revised: 07/30/2011 Document Reviewed: 01/27/2007 ExitCare Patient Information 2012 [employer].Clear Liquid Diet The clear liquid dietconsists of foods that are liquid or will become liquid at room temperature.You should be able to see through the liquid and beverages. Examples of foods allowed on a clear liquid diet include fruit juice, broth or bouillon, gelatin, or frozen ice pops. The purpose of this diet is to provide necessary fluid, electrolytes such as sodium and potassium, and energy to keep the body functioning during times when you are not able to consume a regular diet.A clear liquid diet should not be continued for long periods of time as it is not nutritionally adequate.  REASONS FOR USING A CLEAR LIQUID DIET  In sudden onset (acute) conditions for a patient before or after surgery.   As the first step in oral feeding.   For fluid and electrolyte replacement in diarrheal diseases.   As a diet before certain medical tests are performed.  ADEQUACY The clear liquid diet is adequate only in ascorbic acid, according to the Recommended Dietary Allowances of the National Research Council. CHOOSING FOODS Breads and Starches  Allowed:  None are allowed.   Avoid: All are avoided.  Vegetables  Allowed:  Strained tomato or vegetable juice.   Avoid: Any  others.  Fruit  Allowed:  Strained fruit juices and fruit drinks. Include 1 serving of citrus or vitamin C-enriched fruit juice daily.   Avoid: Any others.  Meat and Meat Substitutes  Allowed:  None are allowed.   Avoid: All are avoided.  Milk  Allowed:  None are allowed.   Avoid: All are avoided.  Soups and Combination Foods  Allowed:  Clear bouillon, broth, or strained broth-based soups.   Avoid: Any others.  Desserts and Sweets  Allowed:  Sugar, honey. High protein gelatin. Flavored gelatin, ices, or frozen ice pops that do not contain milk.   Avoid: Any others.  Fats and Oils  Allowed:  None are allowed.   Avoid: All are avoided.  Beverages  Allowed: Cereal beverages, coffee (regular or decaffeinated), tea, or soda at the discretion of your caregiver.   Avoid: Any others.  Condiments  Allowed:  Iodized salt.   Avoid: Any others, including pepper.  Supplements  Allowed:  Liquid nutrition beverages.   Avoid: Any others that contain lactose or fiber.  SAMPLE MEAL PLAN Breakfast  4 oz (120 mL) strained orange juice.    to 1 cup (125 to 250 mL) gelatin (plain or fortified).   1 cup (250 mL) beverage (coffee or tea).   Sugar, if desired.  Midmorning Snack   cup (125 mL) gelatin (plain or fortified).  Lunch  1 cup (250 mL) broth or consomm.   4 oz (120 mL) strained grapefruit juice.    cup (125 mL) gelatin (plain or fortified).   1 cup (250 mL) beverage (coffee or tea).     Sugar, if desired.  Midafternoon Snack   cup (125 mL) fruit ice.    cup (125 mL) strained fruit juice.  Dinner  1 cup (250 mL) broth or consomm.    cup (125 mL) cranberry juice.    cup (125 mL) flavored gelatin (plain or fortified).   1 cup (250 mL) beverage (coffee or tea).   Sugar, if desired.  Evening Snack  4 oz (120 mL) strained apple juice (vitamin C-fortified).    cup (125 mL) flavored gelatin (plain or fortified).  Document Released: 08/10/2005  Document Revised: 07/30/2011 Document Reviewed: 11/07/2010 ExitCare Patient Information 2012 [employer].Viral Gastroenteritis Viral gastroenteritis is also known as stomach flu. This condition affects the stomach and intestinal tract. It can cause sudden diarrhea and vomiting. The illness typically lasts 3 to 8 days. Most people develop an immune response that eventually gets rid of the virus. While this natural response develops, the virus can make you quite ill. CAUSES  Many different viruses can cause gastroenteritis, such as rotavirus or noroviruses. You can catch one of these viruses by consuming contaminated food or water. You may also catch a virus by sharing utensils or other personal items with an infected person or by touching a contaminated surface. SYMPTOMS  The most common symptoms are diarrhea and vomiting. These problems can cause a severe loss of body fluids (dehydration) and a body salt (electrolyte) imbalance. Other symptoms may include:  Fever.   Headache.   Fatigue.   Abdominal pain.  DIAGNOSIS  Your caregiver can usually diagnose viral gastroenteritis based on your symptoms and a physical exam. A stool sample may also be taken to test for the presence of viruses or other infections. TREATMENT  This illness typically goes away on its own. Treatments are aimed at rehydration. The most serious cases of viral gastroenteritis involve vomiting so severely that you are not able to keep fluids down. In these cases, fluids must be given through an intravenous line (IV). HOME CARE INSTRUCTIONS   Drink enough fluids to keep your urine clear or pale yellow. Drink small amounts of fluids frequently and increase the amounts as tolerated.   Ask your caregiver for specific rehydration instructions.   Avoid:   Foods high in sugar.   Alcohol.   Carbonated drinks.   Tobacco.   Juice.   Caffeine drinks.   Extremely hot or cold fluids.   Fatty, greasy foods.   Too much  intake of anything at one time.   Dairy products until 24 to 48 hours after diarrhea stops.   You may consume probiotics. Probiotics are active cultures of beneficial bacteria. They may lessen the amount and number of diarrheal stools in adults. Probiotics can be found in yogurt with active cultures and in supplements.   Wash your hands well to avoid spreading the virus.   Only take over-the-counter or prescription medicines for pain, discomfort, or fever as directed by your caregiver. Do not give aspirin to children. Antidiarrheal medicines are not recommended.   Ask your caregiver if you should continue to take your regular prescribed and over-the-counter medicines.   Keep all follow-up appointments as directed by your caregiver.  SEEK IMMEDIATE MEDICAL CARE IF:   You are unable to keep fluids down.   You do not urinate at least once every 6 to 8 hours.   You develop shortness of breath.   You notice blood in your stool or vomit. This may look like coffee grounds.   You have abdominal pain that   increases or is concentrated in one small area (localized).   You have persistent vomiting or diarrhea.   You have a fever.   The patient is a child younger than 3 months, and he or she has a fever.   The patient is a child older than 3 months, and he or she has a fever and persistent symptoms.   The patient is a child older than 3 months, and he or she has a fever and symptoms suddenly get worse.   The patient is a baby, and he or she has no tears when crying.  MAKE SURE YOU:   Understand these instructions.   Will watch your condition.   Will get help right away if you are not doing well or get worse.  Document Released: 08/10/2005 Document Revised: 07/30/2011 Document Reviewed: 05/27/2011 Exit[employer] Patient Information 2012 [employer]. 

## 2011-11-05 NOTE — ED Provider Notes (Signed)
History     CSN: [id number]  Arrival date & time 11/05/11  1027   First MD Initiated Contact with Patient 11/05/11 1034      Chief Complaint  Patient presents with  . Vomiting  . Diarrhea  . Abdominal Cramping    (Consider location/radiation/quality/duration/timing/severity/associated sxs/prior treatment) HPI Comments: Patient here with gradual onset 2 days ago of crampy abdominal pain with associated nausea, vomiting and diarrhea - states no recent sick contacts that she knows of but states that now her boyfriend has the same symptoms.  Reports fever and chills, no focality to the abdominal pain - states noticed blood specs in her vomit today.  Reports pain is just before and episode of diarrhea or vomiting - denies blood in the diarrhea - reports that this is watery.  Is unable to keep down fluids at home.  Patient is a 46 y.o. female presenting with diarrhea and cramps. The history is provided by the patient. No language interpreter was used.  Diarrhea The primary symptoms include fever, abdominal pain, nausea, vomiting, diarrhea and hematemesis. Primary symptoms do not include weight loss, fatigue, melena, jaundice, hematochezia, dysuria, myalgias, arthralgias or rash. The illness began 2 days ago. The onset was gradual. The problem has been gradually worsening.  The illness is also significant for anorexia. The illness does not include chills, dysphagia, odynophagia, bloating, constipation, tenesmus, back pain or itching. Associated medical issues do not include inflammatory bowel disease, gallstones, liver disease, alcohol abuse, PUD, bowel resection, irritable bowel syndrome or diverticulitis.  Abdominal Cramping The primary symptoms of the illness include abdominal pain, fever, nausea, vomiting, diarrhea and hematemesis. The primary symptoms of the illness do not include fatigue, hematochezia or dysuria.  Additional symptoms associated with the illness include anorexia. Symptoms  associated with the illness do not include chills, constipation, hematuria or back pain. Significant associated medical issues do not include PUD, inflammatory bowel disease, gallstones, liver disease or diverticulitis.    Past Medical History  Diagnosis Date  . Hypertension     Past Surgical History  Procedure Date  . Eye surgery     No family history on file.  History  Substance Use Topics  . Smoking status: Current Everyday Smoker    Types: Cigarettes  . Smokeless tobacco: Not on file  . Alcohol Use: Yes     occ    OB History    Grav Para Term Preterm Abortions TAB SAB Ect Mult Living                  Review of Systems  Constitutional: Positive for fever. Negative for chills, weight loss and fatigue.  Gastrointestinal: Positive for nausea, vomiting, abdominal pain, diarrhea, anorexia and hematemesis. Negative for dysphagia, constipation, blood in stool, melena, hematochezia, bloating and jaundice.  Genitourinary: Negative for dysuria and hematuria.  Musculoskeletal: Negative for myalgias, back pain and arthralgias.  Skin: Negative for itching and rash.  All other systems reviewed and are negative.    Allergies  Review of patient's allergies indicates no known allergies.  Home Medications   Current Outpatient Rx  Name Route Sig Dispense Refill  . PROPRANOLOL HCL 20 MG PO TABS Oral Take 20 mg by mouth 2 (two) times daily.        BP 132/96  Pulse 100  Temp(Src) 99.4 F (37.4 C) (Oral)  Resp 100  SpO2 100%  LMP 10/09/2011  Physical Exam  Nursing note and vitals reviewed. Constitutional: She is oriented to person, place, and time. She appears  well-developed and well-nourished. No distress.  HENT:  Head: Normocephalic and atraumatic.  Right Ear: External ear normal.  Left Ear: External ear normal.  Nose: Nose normal.  Mouth/Throat: Oropharynx is clear and moist. No oropharyngeal exudate.  Eyes: Conjunctivae are normal. Pupils are equal, round, and  reactive to light. No scleral icterus.  Neck: Normal range of motion. Neck supple.  Cardiovascular: Normal rate, regular rhythm and normal heart sounds.  Exam reveals no gallop and no friction rub.   No murmur heard. Pulmonary/Chest: Effort normal and breath sounds normal. No respiratory distress. She exhibits no tenderness.  Abdominal: Soft. Bowel sounds are normal. She exhibits no distension and no mass. There is generalized tenderness. There is no rebound, no guarding and no CVA tenderness.  Musculoskeletal: Normal range of motion. She exhibits no edema and no tenderness.  Lymphadenopathy:    She has no cervical adenopathy.  Neurological: She is alert and oriented to person, place, and time. No cranial nerve deficit.  Skin: Skin is warm and dry. No rash noted. No erythema. No pallor.  Psychiatric: She has a normal mood and affect. Her behavior is normal. Judgment and thought content normal.    ED Course  Procedures (including critical care time)   Labs Reviewed  CBC  DIFFERENTIAL  COMPREHENSIVE METABOLIC PANEL  LIPASE, BLOOD  URINALYSIS, ROUTINE W REFLEX MICROSCOPIC  PREGNANCY, URINE   No results found. Results for orders placed during the hospital encounter of 11/05/11  CBC      Component Value Range   WBC 10.4  4.0 - 10.5 (K/uL)   RBC 4.14  3.87 - 5.11 (MIL/uL)   Hemoglobin 9.8 (*) 12.0 - 15.0 (g/dL)   HCT 40.9 (*) 81.1 - 46.0 (%)   MCV 73.2 (*) 78.0 - 100.0 (fL)   MCH 23.7 (*) 26.0 - 34.0 (pg)   MCHC 32.3  30.0 - 36.0 (g/dL)   RDW 91.4 (*) 78.2 - 15.5 (%)   Platelets 309  150 - 400 (K/uL)  DIFFERENTIAL      Component Value Range   Neutrophils Relative 67  43 - 77 (%)   Neutro Abs 7.0  1.7 - 7.7 (K/uL)   Lymphocytes Relative 24  12 - 46 (%)   Lymphs Abs 2.5  0.7 - 4.0 (K/uL)   Monocytes Relative 7  3 - 12 (%)   Monocytes Absolute 0.7  0.1 - 1.0 (K/uL)   Eosinophils Relative 1  0 - 5 (%)   Eosinophils Absolute 0.1  0.0 - 0.7 (K/uL)   Basophils Relative 1  0 - 1  (%)   Basophils Absolute 0.1  0.0 - 0.1 (K/uL)  COMPREHENSIVE METABOLIC PANEL      Component Value Range   Sodium 138  135 - 145 (mEq/L)   Potassium 3.8  3.5 - 5.1 (mEq/L)   Chloride 104  96 - 112 (mEq/L)   CO2 26  19 - 32 (mEq/L)   Glucose, Bld 84  70 - 99 (mg/dL)   BUN 9  6 - 23 (mg/dL)   Creatinine, Ser 9.56  0.50 - 1.10 (mg/dL)   Calcium 9.0  8.4 - 21.3 (mg/dL)   Total Protein 7.9  6.0 - 8.3 (g/dL)   Albumin 3.7  3.5 - 5.2 (g/dL)   AST 19  0 - 37 (U/L)   ALT 9  0 - 35 (U/L)   Alkaline Phosphatase 68  39 - 117 (U/L)   Total Bilirubin 0.2 (*) 0.3 - 1.2 (mg/dL)   GFR calc non  Af Amer >90  >90 (mL/min)   GFR calc Af Amer >90  >90 (mL/min)  LIPASE, BLOOD      Component Value Range   Lipase 26  11 - 59 (U/L)  URINALYSIS, ROUTINE W REFLEX MICROSCOPIC      Component Value Range   Color, Urine YELLOW  YELLOW    APPearance CLOUDY (*) CLEAR    Specific Gravity, Urine 1.011  1.005 - 1.030    pH 6.0  5.0 - 8.0    Glucose, UA NEGATIVE  NEGATIVE (mg/dL)   Hgb urine dipstick NEGATIVE  NEGATIVE    Bilirubin Urine NEGATIVE  NEGATIVE    Ketones, ur NEGATIVE  NEGATIVE (mg/dL)   Protein, ur NEGATIVE  NEGATIVE (mg/dL)   Urobilinogen, UA 0.2  0.0 - 1.0 (mg/dL)   Nitrite NEGATIVE  NEGATIVE    Leukocytes, UA NEGATIVE  NEGATIVE   PREGNANCY, URINE      Component Value Range   Preg Test, Ur NEGATIVE  NEGATIVE    No results found.   No diagnosis found.    MDM  Do not suspect surgical or infectious cause - believe this to be gastroenteritis - will get basic labs and given IV fluids - will re-assess.      Vomited after re-assessment - given a second liter of fluids and repeating the zofran, labs reviewed, do not suspect surgical or infectious cause to this - likely viral gastroenteritis.  Feels better after second liter and zofran - able to keep down po fluids - will discharge home with same.  Izola Price [place], [place] 11/05/11 1359

## 2011-11-06 NOTE — ED Provider Notes (Signed)
Medical screening examination/treatment/procedure(s) were performed by non-physician practitioner and as supervising physician I was immediately available for consultation/collaboration.   Forbes Cellar, MD 11/06/11 1800

## 2011-12-05 ENCOUNTER — Encounter (HOSPITAL_COMMUNITY): Payer: Self-pay

## 2011-12-05 ENCOUNTER — Emergency Department (HOSPITAL_COMMUNITY): Payer: Self-pay

## 2011-12-05 ENCOUNTER — Emergency Department (HOSPITAL_COMMUNITY)
Admission: EM | Admit: 2011-12-05 | Discharge: 2011-12-05 | Disposition: A | Discharge status: Home / Self Care | Payer: Self-pay | Attending: Emergency Medicine | Admitting: Emergency Medicine

## 2011-12-05 DIAGNOSIS — Z79899 Other long term (current) drug therapy: Secondary | ICD-10-CM | POA: Insufficient documentation

## 2011-12-05 DIAGNOSIS — N949 Unspecified condition associated with female genital organs and menstrual cycle: Secondary | ICD-10-CM | POA: Insufficient documentation

## 2011-12-05 DIAGNOSIS — I10 Essential (primary) hypertension: Secondary | ICD-10-CM | POA: Insufficient documentation

## 2011-12-05 LAB — CBC
MCH: 23.1 pg — ABNORMAL LOW (ref 26.0–34.0)
MCHC: 31.1 g/dL (ref 30.0–36.0)
MCV: 74.5 fL — ABNORMAL LOW (ref 78.0–100.0)
Platelets: 339 10*3/uL (ref 150–400)
RBC: 4.15 MIL/uL (ref 3.87–5.11)

## 2011-12-05 LAB — BASIC METABOLIC PANEL
BUN: 8 mg/dL (ref 6–23)
CO2: 23 mEq/L (ref 19–32)
Calcium: 8.9 mg/dL (ref 8.4–10.5)
Creatinine, Ser: 0.93 mg/dL (ref 0.50–1.10)
Glucose, Bld: 84 mg/dL (ref 70–99)
Sodium: 139 mEq/L (ref 135–145)

## 2011-12-05 LAB — DIFFERENTIAL
Eosinophils Absolute: 0.1 10*3/uL (ref 0.0–0.7)
Eosinophils Relative: 1 % (ref 0–5)
Lymphs Abs: 2.9 10*3/uL (ref 0.7–4.0)

## 2011-12-05 MED ORDER — FENTANYL CITRATE 0.05 MG/ML IJ SOLN: 50.0000 ug | Freq: Once | INTRAMUSCULAR | Status: DC

## 2011-12-05 MED ORDER — DIAZEPAM 5 MG PO TABS
10.0000 mg | ORAL_TABLET | Freq: Once | ORAL | Status: AC
  Administered 2011-12-05: 10 mg via ORAL
  Filled 2011-12-05 (×2): qty 1

## 2011-12-05 MED ORDER — NAPROXEN 500 MG PO TABS: 500.0000 mg | ORAL_TABLET | Freq: Two times a day (BID) | ORAL | Status: DC

## 2011-12-05 MED ORDER — FENTANYL CITRATE 0.05 MG/ML IJ SOLN
100.0000 ug | Freq: Once | INTRAMUSCULAR | Status: AC
  Administered 2011-12-05: 100 ug via INTRAVENOUS
  Filled 2011-12-05: qty 2

## 2011-12-05 MED ORDER — IOHEXOL 300 MG/ML  SOLN
100.0000 mL | Freq: Once | INTRAMUSCULAR | Status: AC | PRN
  Administered 2011-12-05: 100 mL via INTRAVENOUS

## 2011-12-05 MED ORDER — OXYCODONE-ACETAMINOPHEN 5-325 MG PO TABS: ORAL_TABLET | ORAL | Status: AC

## 2011-12-05 MED ORDER — KETOROLAC TROMETHAMINE 30 MG/ML IJ SOLN
30.0000 mg | Freq: Once | INTRAMUSCULAR | Status: DC
  Filled 2011-12-05: qty 1

## 2011-12-05 NOTE — Discharge Instructions (Signed)
Alternate between percocet and naprosyn for pain. Use over the counter stool softener with percocet use but also to loosen stool to decrease pain. Call Dr. Jamse Mead office today to schedule close follow up appointment next week for recheck of ongoing pain but return to ER for any changing or worsening of symptoms.

## 2011-12-05 NOTE — ED Provider Notes (Signed)
History     CSN: [id number]  Arrival date & time 12/05/11  1040   First MD Initiated Contact with Patient 12/05/11 1122      Chief Complaint  Patient presents with  . Abscess    vaginal abscess left side    (Consider location/radiation/quality/duration/timing/severity/associated sxs/prior treatment) HPI  Patient presents to ER with concern of recurrent abscess stating hx of recurrent abscess with hx of surgical I&D by Dr. Johna Sheriff for perineal abscess returns to the ER complaining of a few day hx of pain in pelvic region at site or prior I&D that she states is similar to the pain with all prior infections. Patient states pain was gradual onset, persistent and worsening. Denies aggravating or alleviating factors. Patient states pain is severe and constant but aggravated by sitting and movement. She has taken tylenol and NSAIDs without relief of pain. Denies fevers, chills, abdominal pain, n/v/d, dysuria, hematuria, blood in stool or pain with defecation. She denies vaginal d/c. Patient states she began her menstrual cycle today.   See note below from 01/04/2009 by Dr. Johna Sheriff:  PREOPERATIVE DIAGNOSIS: Perineal abscess.  POSTOPERATIVE DIAGNOSIS: Perineal abscess.  SURGICAL PROCEDURES: Incision and drainage of perineal abscess.  SURGEON: Lorne Skeens. Hoxworth, M.D.  ANESTHESIA: General.  BRIEF HISTORY: Ms. Milberger is a 46 year old female who presents with a 4-  5 day history of increasing pain, swelling and tenderness in the left  perineum. Physical exam and CT scan obtained from the emergency room  shows apparent large abscess just to the left of the vagina in the  perineum extending up into the labium on the left. I have recommended  incision and drainage under general anesthesia in the operating room.  She is brought to the OR for this procedure.   Past Medical History  Diagnosis Date  . Hypertension     Past Surgical History  Procedure Date  . Eye surgery     No family  history on file.  History  Substance Use Topics  . Smoking status: Current Everyday Smoker    Types: Cigarettes  . Smokeless tobacco: Not on file  . Alcohol Use: Yes     occ    OB History    Grav Para Term Preterm Abortions TAB SAB Ect Mult Living                  Review of Systems  All other systems reviewed and are negative.    Allergies  Review of patient's allergies indicates no known allergies.  Home Medications   Current Outpatient Rx  Name Route Sig Dispense Refill  . IBUPROFEN 200 MG PO TABS Oral Take 800 mg by mouth every 6 (six) hours as needed. pain    . MIRTAZAPINE 15 MG PO TABS Oral Take 15 mg by mouth at bedtime.    . ADULT MULTIVITAMIN W/MINERALS CH Oral Take 1 tablet by mouth daily.    Marland Kitchen PROPRANOLOL HCL 20 MG PO TABS Oral Take 20 mg by mouth 2 (two) times daily.        BP 152/82  Pulse 90  Temp(Src) 98.6 F (37 C) (Oral)  Resp 20  SpO2 100%  LMP 11/06/2011  Physical Exam  Nursing note and vitals reviewed. Constitutional: She is oriented to person, place, and time. She appears well-developed and well-nourished. No distress.  HENT:  Head: Normocephalic and atraumatic.  Eyes: Conjunctivae are normal.  Neck: Normal range of motion. Neck supple.  Cardiovascular: Normal rate, regular rhythm, normal heart sounds and intact  distal pulses.  Exam reveals no gallop and no friction rub.   No murmur heard. Pulmonary/Chest: Effort normal and breath sounds normal. No respiratory distress. She has no wheezes. She has no rales. She exhibits no tenderness.  Abdominal: Soft. Bowel sounds are normal. She exhibits no distension and no mass. There is no tenderness. There is no rebound and no guarding.  Genitourinary: Vagina normal.       Severe TTP of left lower labial majora region at site of well healed surgical scar without induration, fluctuance or erythema. No TTP of right labia or pelvis. No TTP of surrounding soft tissue of gluteal region.   Bleeding from  vaginal vault with no vaginal TTP or CMT or adnexal TTP.   Musculoskeletal: Normal range of motion. She exhibits no edema and no tenderness.  Neurological: She is alert and oriented to person, place, and time.  Skin: Skin is warm and dry. No rash noted. She is not diaphoretic. No erythema.  Psychiatric: She has a normal mood and affect.    ED Course  Procedures (including critical care time)  IV fentanyl, PO valium.  IV toradol  Labs Reviewed  BASIC METABOLIC PANEL - Abnormal; Notable for the following:    GFR calc non Af Amer 74 (*)    GFR calc Af Amer 85 (*)    All other components within normal limits  CBC - Abnormal; Notable for the following:    WBC 12.2 (*)    Hemoglobin 9.6 (*)    HCT 30.9 (*)    MCV 74.5 (*)    MCH 23.1 (*)    RDW 17.6 (*)    All other components within normal limits  DIFFERENTIAL - Abnormal; Notable for the following:    Neutro Abs 8.4 (*)    All other components within normal limits   Ct Pelvis W Contrast  12/05/2011  *RADIOLOGY REPORT*  Clinical Data:  History of Bartholin cyst abscess.  There is a outpatient in left labia region.  CT PELVIS WITH CONTRAST  Technique:  Multidetector CT imaging of the pelvis was performed using the standard protocol following the bolus administration of intravenous contrast.  Contrast: OMNIPAQUE IOHEXOL 300 MG/ML  SOLN  Comparison:  CT abdomen pelvis 03/07/2011  Findings:  There is no clear abscess or abnormality associate with external genitalia.  The uterus, vagina, and cervix are grossly normal by CT.  The cervical region does appears slightly larger than on comparison exam is poorly evaluated by CT.  There is no evidence of abscess within the intraperitoneal space or in the perirectal fat. Small volume  free fluid in the pelvis has simple fluid attenuation  The ovaries are normal.  The bladder appears normal.  No evidence of the pelvic adenopathy.  The rectum appears normal.  Limited view of the inferior kidneys is  normal. No osseous abnormality.  IMPRESSION:  1.  No clear abnormality of the external genitalia by CT.  If continued clinical concern recommend MRI of the pelvis with contrast which has the superior soft tissue contrast. 2.  Small amount free fluid the pelvis is likely physiologic. 3.  No evidence of abscess.  Original Report Authenticated By: Genevive Bi, M.D.     1. Perineal pain in female       MDM  No obvious external abscess or signs of infection with no acute findings on CT of pelvis. TTP at site of prior I&D without induration, redness or fluctuance with no TTP of vaginal vault and no  rectal TTP with digital exam. Unsure of origin of pain but will treat pain and have patient follow up with Dr. Johna Sheriff this coming week. Abdomen is soft and non tender and patient is afebrile and non toxic appearing. Remainder of soft tissue of buttock is non tender. Spoke at length with patient about changing or worsening of symptoms that should warrant return to ER. Patient voices understanding and is agreeable to plan.         [place], [place] 12/05/11 1429

## 2011-12-05 NOTE — ED Notes (Signed)
States vaginal abscess states pain started Wednesday worsening today states some drainage noted denies odor states a hx of vaginal abscess

## 2011-12-05 NOTE — ED Notes (Signed)
Pt states increasing pain to L labia, states pain is worst ever, had I/D of abcess to same area 2010.

## 2011-12-05 NOTE — ED Notes (Signed)
Patient is alert and oriented x3.  She was given DC instructions and follow up visit instructions.  Patient gave verbal understanding. She was DC ambulatory under his own power to home.  V/S stable.  He was not showing any signs of distress on DC 

## 2011-12-08 NOTE — ED Provider Notes (Signed)
Medical screening examination/treatment/procedure(s) were performed by non-physician practitioner and as supervising physician I was immediately available for consultation/collaboration.  Toy Baker, MD 12/08/11 [phone]

## 2012-01-29 ENCOUNTER — Encounter (HOSPITAL_COMMUNITY): Payer: Self-pay | Admitting: *Deleted

## 2012-01-29 ENCOUNTER — Emergency Department (HOSPITAL_COMMUNITY)
Admission: EM | Admit: 2012-01-29 | Discharge: 2012-01-29 | Disposition: A | Discharge status: Home / Self Care | Payer: Self-pay | Attending: Emergency Medicine | Admitting: Emergency Medicine

## 2012-01-29 DIAGNOSIS — H9201 Otalgia, right ear: Secondary | ICD-10-CM

## 2012-01-29 DIAGNOSIS — H9209 Otalgia, unspecified ear: Secondary | ICD-10-CM | POA: Insufficient documentation

## 2012-01-29 DIAGNOSIS — F17200 Nicotine dependence, unspecified, uncomplicated: Secondary | ICD-10-CM | POA: Insufficient documentation

## 2012-01-29 DIAGNOSIS — I10 Essential (primary) hypertension: Secondary | ICD-10-CM | POA: Insufficient documentation

## 2012-01-29 MED ORDER — AMOXICILLIN 500 MG PO CAPS
1000.0000 mg | ORAL_CAPSULE | Freq: Once | ORAL | Status: AC
  Administered 2012-01-29: 1000 mg via ORAL
  Filled 2012-01-29: qty 2

## 2012-01-29 MED ORDER — IBUPROFEN 800 MG PO TABS
800.0000 mg | ORAL_TABLET | Freq: Once | ORAL | Status: AC
  Administered 2012-01-29: 800 mg via ORAL
  Filled 2012-01-29: qty 1

## 2012-01-29 MED ORDER — OXYCODONE-ACETAMINOPHEN 5-325 MG PO TABS: 1.0000 | ORAL_TABLET | ORAL | Status: AC | PRN

## 2012-01-29 MED ORDER — OXYCODONE-ACETAMINOPHEN 5-325 MG PO TABS
1.0000 | ORAL_TABLET | Freq: Once | ORAL | Status: AC
  Administered 2012-01-29: 1 via ORAL
  Filled 2012-01-29: qty 1

## 2012-01-29 MED ORDER — AMOXICILLIN 500 MG PO CAPS: 1000.0000 mg | ORAL_CAPSULE | Freq: Two times a day (BID) | ORAL | Status: AC

## 2012-01-29 MED ORDER — ANTIPYRINE-BENZOCAINE 5.4-1.4 % OT SOLN
3.0000 [drp] | Freq: Once | OTIC | Status: AC
  Administered 2012-01-29: 4 [drp] via OTIC
  Filled 2012-01-29: qty 10

## 2012-01-29 NOTE — ED Provider Notes (Signed)
Medical screening examination/treatment/procedure(s) were performed by non-physician practitioner and as supervising physician I was immediately available for consultation/collaboration.  Flint Melter, MD 01/29/12 2113

## 2012-01-29 NOTE — ED Notes (Signed)
Pt states she is having right ear pain. Pt states she feel as if something is ringing in her right ear and constant ear pain. Pt states she is a little unbalances with ambulation. Pt states she cleans her ear with bobby pins and que tips

## 2012-01-29 NOTE — ED Notes (Signed)
edmd notified pt has not been signed up for

## 2012-01-29 NOTE — Discharge Instructions (Signed)
TAKE MEDICATIONS AS PRESCRIBED AND FOLLOW UP WITH DR. Suszanne Conners FOR RECHECK BY CALLING HIS OFFICE TO SCHEDULE A TIME TO BE SEEN EARLY NEXT WEEK. RETURN HERE AS NEEDED.  Otalgia The most common reason for this in children is an infection of the middle ear. Pain from the middle ear is usually caused by a build-up of fluid and pressure behind the eardrum. Pain from an earache can be sharp, dull, or burning. The pain may be temporary or constant. The middle ear is connected to the nasal passages by a short narrow tube called the Eustachian tube. The Eustachian tube allows fluid to drain out of the middle ear, and helps keep the pressure in your ear equalized. CAUSES  A cold or allergy can block the Eustachian tube with inflammation and the build-up of secretions. This is especially likely in small children, because their Eustachian tube is shorter and more horizontal. When the Eustachian tube closes, the normal flow of fluid from the middle ear is stopped. Fluid can accumulate and cause stuffiness, pain, hearing loss, and an ear infection if germs start growing in this area. SYMPTOMS  The symptoms of an ear infection may include fever, ear pain, fussiness, increased crying, and irritability. Many children will have temporary and minor hearing loss during and right after an ear infection. Permanent hearing loss is rare, but the risk increases the more infections a child has. Other causes of ear pain include retained water in the outer ear canal from swimming and bathing. Ear pain in adults is less likely to be from an ear infection. Ear pain may be referred from other locations. Referred pain may be from the joint between your jaw and the skull. It may also come from a tooth problem or problems in the neck. Other causes of ear pain include:  A foreign body in the ear.   Outer ear infection.   Sinus infections.   Impacted ear wax.   Ear injury.   Arthritis of the jaw or TMJ problems.   Middle ear  infection.   Tooth infections.   Sore throat with pain to the ears.  DIAGNOSIS  Your caregiver can usually make the diagnosis by examining you. Sometimes other special studies, including x-rays and lab work may be necessary. TREATMENT   If antibiotics were prescribed, use them as directed and finish them even if you or your child's symptoms seem to be improved.   Sometimes PE tubes are needed in children. These are little plastic tubes which are put into the eardrum during a simple surgical procedure. They allow fluid to drain easier and allow the pressure in the middle ear to equalize. This helps relieve the ear pain caused by pressure changes.  HOME CARE INSTRUCTIONS   Only take over-the-counter or prescription medicines for pain, discomfort, or fever as directed by your caregiver. DO NOT GIVE CHILDREN ASPIRIN because of the association of Reye's Syndrome in children taking aspirin.   Use a cold pack applied to the outer ear for 15 to 20 minutes, 3 to 4 times per day or as needed may reduce pain. Do not apply ice directly to the skin. You may cause frost bite.   Over-the-counter ear drops used as directed may be effective. Your caregiver may sometimes prescribe ear drops.   Resting in an upright position may help reduce pressure in the middle ear and relieve pain.   Ear pain caused by rapidly descending from high altitudes can be relieved by swallowing or chewing gum. Allowing infants  to suck on a bottle during airplane travel can help.   Do not smoke in the house or near children. If you are unable to quit smoking, smoke outside.   Control allergies.  SEEK IMMEDIATE MEDICAL CARE IF:   You or your child are becoming sicker.   Pain or fever relief is not obtained with medicine.   You or your child's symptoms (pain, fever, or irritability) do not improve within 24 to 48 hours or as instructed.   Severe pain suddenly stops hurting. This may indicate a ruptured eardrum.   You or  your children develop new problems such as severe headaches, stiff neck, difficulty swallowing, or swelling of the face or around the ear.  Document Released: 03/27/2004 Document Revised: 07/30/2011 Document Reviewed: 08/01/2008 [facility] Patient Information 2012 [place], [place].

## 2012-01-29 NOTE — ED Provider Notes (Signed)
History     CSN: [id number]  Arrival date & time 01/29/12  1191   First MD Initiated Contact with Patient 01/29/12 1009      Chief Complaint  Patient presents with  . Otalgia    (Consider location/radiation/quality/duration/timing/severity/associated sxs/prior treatment) Patient is a 46 y.o. female presenting with ear pain. The history is provided by the patient.  Otalgia This is a new problem. The current episode started more than 2 days ago. There is pain in the right ear. The problem has not changed since onset.There has been no fever. Pertinent negatives include no ear discharge, no headaches, no rhinorrhea and no sore throat. Associated symptoms comments: Right ear pain without bleeding or discharge from the external canal. No fever. Her hearing is muffled. She denies congestion, sore throat, cough. . Her past medical history does not include chronic ear infection.    Past Medical History  Diagnosis Date  . Hypertension     Past Surgical History  Procedure Date  . Eye surgery     No family history on file.  History  Substance Use Topics  . Smoking status: Current Everyday Smoker    Types: Cigarettes  . Smokeless tobacco: Not on file  . Alcohol Use: Yes     occ    OB History    Grav Para Term Preterm Abortions TAB SAB Ect Mult Living                  Review of Systems  Constitutional: Negative for fever.  HENT: Positive for ear pain. Negative for congestion, sore throat, rhinorrhea, dental problem and ear discharge.   Respiratory: Negative for shortness of breath.   Gastrointestinal: Negative for nausea.  Neurological: Negative for headaches.       She feels like her balance is off.    Allergies  Review of patient's allergies indicates no known allergies.  Home Medications   Current Outpatient Rx  Name Route Sig Dispense Refill  . IBUPROFEN 200 MG PO TABS Oral Take 800 mg by mouth every 6 (six) hours as needed. pain    . MIRTAZAPINE 15 MG PO TABS  Oral Take 15 mg by mouth at bedtime.    . ADULT MULTIVITAMIN W/MINERALS CH Oral Take 1 tablet by mouth daily.    Marland Kitchen PROPRANOLOL HCL 20 MG PO TABS Oral Take 20 mg by mouth 2 (two) times daily.        BP 178/107  Pulse 81  Temp(Src) 98.4 F (36.9 C) (Oral)  Resp 20  Ht 5\' 6"  (1.676 m)  Wt 105 lb (47.628 kg)  BMI 16.95 kg/m2  SpO2 100%  LMP 01/29/2012  Physical Exam  Constitutional: She is oriented to person, place, and time. She appears well-developed and well-nourished. No distress.  HENT:  Head: Normocephalic.  Left Ear: External ear normal.  Mouth/Throat: Oropharynx is clear and moist.       Right TM has fluid present that does not appear purulent, with loss of landmarks. No redness. External canal and external ear unremarkable. Poor dentition generally without acute process.   Neck: Normal range of motion.  Pulmonary/Chest: Effort normal.  Lymphadenopathy:    She has no cervical adenopathy.  Neurological: She is alert and oriented to person, place, and time.       Ambulatory with steady gait.  Skin: Skin is warm and dry.    ED Course  Procedures (including critical care time)  Labs Reviewed - No data to display No results found.  No diagnosis found.  1. Right otalgia   MDM  Significant swelling with loss of TM landmarks - choose to treat with abx, pain relief, and ENT referral.        Rodena Medin, PA-C 01/29/12 1130

## 2012-02-23 ENCOUNTER — Encounter (HOSPITAL_COMMUNITY): Payer: Self-pay | Admitting: *Deleted

## 2012-02-23 ENCOUNTER — Emergency Department (HOSPITAL_COMMUNITY)
Admission: EM | Admit: 2012-02-23 | Discharge: 2012-02-23 | Disposition: A | Discharge status: Home / Self Care | Payer: Self-pay | Attending: Emergency Medicine | Admitting: Emergency Medicine

## 2012-02-23 DIAGNOSIS — R002 Palpitations: Secondary | ICD-10-CM | POA: Insufficient documentation

## 2012-02-23 DIAGNOSIS — R51 Headache: Secondary | ICD-10-CM | POA: Insufficient documentation

## 2012-02-23 DIAGNOSIS — H53149 Visual discomfort, unspecified: Secondary | ICD-10-CM | POA: Insufficient documentation

## 2012-02-23 DIAGNOSIS — R079 Chest pain, unspecified: Secondary | ICD-10-CM | POA: Insufficient documentation

## 2012-02-23 MED ORDER — KETOROLAC TROMETHAMINE 30 MG/ML IJ SOLN
30.0000 mg | Freq: Once | INTRAMUSCULAR | Status: AC
  Administered 2012-02-23: 30 mg via INTRAVENOUS
  Filled 2012-02-23: qty 1

## 2012-02-23 MED ORDER — PROMETHAZINE HCL 25 MG/ML IJ SOLN
25.0000 mg | Freq: Once | INTRAMUSCULAR | Status: AC
  Administered 2012-02-23: 25 mg via INTRAVENOUS
  Filled 2012-02-23: qty 1

## 2012-02-23 MED ORDER — MORPHINE SULFATE 4 MG/ML IJ SOLN
4.0000 mg | Freq: Once | INTRAMUSCULAR | Status: AC
  Administered 2012-02-23: 4 mg via INTRAVENOUS
  Filled 2012-02-23: qty 1

## 2012-02-23 MED ORDER — SODIUM CHLORIDE 0.9 % IV SOLN
INTRAVENOUS | Status: DC
  Administered 2012-02-23: 08:00:00 via INTRAVENOUS

## 2012-02-23 MED ORDER — LISINOPRIL 20 MG PO TABS: 20.0000 mg | ORAL_TABLET | Freq: Every day | ORAL | Status: DC

## 2012-02-23 MED ORDER — OXYCODONE-ACETAMINOPHEN 5-325 MG PO TABS: 2.0000 | ORAL_TABLET | ORAL | Status: AC | PRN

## 2012-02-23 MED ORDER — LISINOPRIL 10 MG PO TABS
10.0000 mg | ORAL_TABLET | Freq: Once | ORAL | Status: AC
  Administered 2012-02-23: 10 mg via ORAL
  Filled 2012-02-23: qty 1

## 2012-02-23 NOTE — ED Provider Notes (Addendum)
History     CSN: [id number]  Arrival date & time 02/23/12  0630   First MD Initiated Contact with Patient 02/23/12 [phone]      Chief Complaint  Patient presents with  . Panic Attack    (Consider location/radiation/quality/duration/timing/severity/associated sxs/prior treatment) The history is provided by the patient.   the patient is a 46 year old, female, with a history of panic attacks, and migraine headaches, who presents to emergency department complaining of a headache, and chest pain, with palpitations.  She says her headache was present when she woke up.  Today.  It is left-sided.  It feels as if something is twisting.  She also sees flashes of light and has photophobia.  She had vomiting once, but denies nausea at this time.  She denies fevers, chills, rash, neck pain, or stiffness.  She denies pain anywhere except for her head and chest.  She denies urinary tract symptoms  Past Medical History  Diagnosis Date  . Hypertension     Past Surgical History  Procedure Date  . Eye surgery   . Vaginal wound closure / repair     History reviewed. No pertinent family history.  History  Substance Use Topics  . Smoking status: Current Everyday Smoker    Types: Cigarettes  . Smokeless tobacco: Not on file  . Alcohol Use: Yes     occ    OB History    Grav Para Term Preterm Abortions TAB SAB Ect Mult Living                  Review of Systems  Constitutional: Negative for fever and chills.  HENT: Negative for neck pain and neck stiffness.   Eyes: Positive for photophobia and visual disturbance.  Respiratory: Negative for chest tightness and shortness of breath.   Cardiovascular: Positive for chest pain and palpitations.  Gastrointestinal: Negative for nausea, vomiting, abdominal pain and diarrhea.  Genitourinary: Negative for dysuria.  Musculoskeletal: Negative for back pain.  Skin: Negative for rash.  Neurological: Positive for headaches. Negative for weakness and  numbness.  Psychiatric/Behavioral: Negative for confusion.  All other systems reviewed and are negative.    Allergies  Review of patient's allergies indicates no known allergies.  Home Medications   Current Outpatient Rx  Name Route Sig Dispense Refill  . MIRTAZAPINE 15 MG PO TABS Oral Take 15 mg by mouth at bedtime.      BP 162/110  Pulse 79  Temp 98.4 F (36.9 C) (Oral)  Resp 18  Ht 5\' 6"  (1.676 m)  Wt 120 lb (54.432 kg)  BMI 19.37 kg/m2  SpO2 100%  LMP 01/29/2012  Physical Exam  Constitutional: She is oriented to person, place, and time. She appears well-developed and well-nourished.  HENT:  Head: Normocephalic and atraumatic.  Eyes: Conjunctivae and EOM are normal.  Neck: Normal range of motion. Neck supple.       No nuchal rigidity  Cardiovascular: Normal rate.   No murmur heard. Pulmonary/Chest: Effort normal. No respiratory distress.  Abdominal: Soft. She exhibits no distension. There is no tenderness.  Musculoskeletal: Normal range of motion.  Neurological: She is alert and oriented to person, place, and time. She has normal strength. No cranial nerve deficit. Coordination normal. GCS eye subscore is 4. GCS verbal subscore is 5. GCS motor subscore is 6.  Skin: Skin is warm and dry.  Psychiatric: She has a normal mood and affect. Thought content normal.    ED Course  Procedures (including critical care time) migraine  headache.  No signs of toxicity.  No evidence of CNS infection or systemic illness.  No neurological deficits and no change in mental status.  There are no tests indicated.  Treat with Toradol  and Phenergan  Labs Reviewed - No data to display No results found.   No diagnosis found.  9:05 AM Still has ha.  Cp resolved. Will give morphine  10:31 AM Pain resolved  MDM  Headache - no signs cns infection or systemic illness. No neuro deficits.  Ha resolved. Ms normal.        Cheri Guppy, MD 02/23/12 [id number]  Cheri Guppy,  MD 02/23/12 3295  Cheri Guppy, MD 02/23/12 1031  Cheri Guppy, MD 02/23/12 1032

## 2012-02-23 NOTE — Discharge Instructions (Signed)
Take ibuprofen 600 mg every 6 hours for recurrent pain.  Use percocet for more severe pain.  Follow up with your doctor for reevaluation if your headaches become frequent.  Return for worse or uncontrolled symptoms.

## 2012-02-23 NOTE — ED Notes (Signed)
Patient voiced understanding of instruction given

## 2012-02-23 NOTE — ED Notes (Signed)
Pt brought in via ems and taken to room 16. Pt states she was at home this am and getting ready for work and she stated the became dizzy and her heart rate increased. Pt states this has occurred prior to this am. Pt has hx of panic attack.

## 2012-02-23 NOTE — ED Notes (Signed)
Knocked on pt's door to medicate pt. Pt is very upset stating could you please not knock on my door so loud "I here for a headache". Pt states could I have a new nurse "I don't want you as my nurse with this attitude." Maple Mirza at bedside attempting iv access and medicating pt.

## 2012-07-08 ENCOUNTER — Encounter (HOSPITAL_COMMUNITY): Payer: Self-pay | Admitting: Emergency Medicine

## 2012-07-08 ENCOUNTER — Emergency Department (HOSPITAL_COMMUNITY)
Admission: EM | Admit: 2012-07-08 | Discharge: 2012-07-08 | Disposition: A | Discharge status: Home / Self Care | Payer: Self-pay | Attending: Emergency Medicine | Admitting: Emergency Medicine

## 2012-07-08 ENCOUNTER — Emergency Department (HOSPITAL_COMMUNITY): Payer: Self-pay

## 2012-07-08 DIAGNOSIS — R197 Diarrhea, unspecified: Secondary | ICD-10-CM | POA: Insufficient documentation

## 2012-07-08 DIAGNOSIS — N390 Urinary tract infection, site not specified: Secondary | ICD-10-CM | POA: Insufficient documentation

## 2012-07-08 DIAGNOSIS — R112 Nausea with vomiting, unspecified: Secondary | ICD-10-CM | POA: Insufficient documentation

## 2012-07-08 DIAGNOSIS — I10 Essential (primary) hypertension: Secondary | ICD-10-CM | POA: Insufficient documentation

## 2012-07-08 DIAGNOSIS — F17200 Nicotine dependence, unspecified, uncomplicated: Secondary | ICD-10-CM | POA: Insufficient documentation

## 2012-07-08 DIAGNOSIS — R109 Unspecified abdominal pain: Secondary | ICD-10-CM | POA: Insufficient documentation

## 2012-07-08 LAB — CBC WITH DIFFERENTIAL/PLATELET
Basophils Relative: 0 % (ref 0–1)
Eosinophils Absolute: 0.1 10*3/uL (ref 0.0–0.7)
Eosinophils Relative: 1 % (ref 0–5)
HCT: 31.3 % — ABNORMAL LOW (ref 36.0–46.0)
Hemoglobin: 10.1 g/dL — ABNORMAL LOW (ref 12.0–15.0)
MCH: 23.6 pg — ABNORMAL LOW (ref 26.0–34.0)
MCHC: 32.3 g/dL (ref 30.0–36.0)
MCV: 73.1 fL — ABNORMAL LOW (ref 78.0–100.0)
Monocytes Absolute: 0.7 10*3/uL (ref 0.1–1.0)
Monocytes Relative: 7 % (ref 3–12)

## 2012-07-08 LAB — URINALYSIS, ROUTINE W REFLEX MICROSCOPIC
Hgb urine dipstick: NEGATIVE
Nitrite: NEGATIVE
Specific Gravity, Urine: 1.017 (ref 1.005–1.030)
Urobilinogen, UA: 0.2 mg/dL (ref 0.0–1.0)
pH: 6 (ref 5.0–8.0)

## 2012-07-08 LAB — COMPREHENSIVE METABOLIC PANEL
ALT: 11 U/L (ref 0–35)
AST: 16 U/L (ref 0–37)
Calcium: 9.3 mg/dL (ref 8.4–10.5)
Potassium: 4.2 mEq/L (ref 3.5–5.1)
Sodium: 140 mEq/L (ref 135–145)
Total Protein: 7.5 g/dL (ref 6.0–8.3)

## 2012-07-08 LAB — PREGNANCY, URINE: Preg Test, Ur: NEGATIVE

## 2012-07-08 LAB — URINE MICROSCOPIC-ADD ON

## 2012-07-08 MED ORDER — ONDANSETRON HCL 4 MG/2ML IJ SOLN
4.0000 mg | Freq: Once | INTRAMUSCULAR | Status: AC
  Administered 2012-07-08: 4 mg via INTRAVENOUS
  Filled 2012-07-08: qty 2

## 2012-07-08 MED ORDER — CEPHALEXIN 500 MG PO CAPS: 500.0000 mg | ORAL_CAPSULE | Freq: Two times a day (BID) | ORAL | Status: DC

## 2012-07-08 MED ORDER — SODIUM CHLORIDE 0.9 % IV BOLUS (SEPSIS)
1000.0000 mL | Freq: Once | INTRAVENOUS | Status: AC
  Administered 2012-07-08: 1000 mL via INTRAVENOUS

## 2012-07-08 MED ORDER — OXYCODONE-ACETAMINOPHEN 5-325 MG PO TABS: ORAL_TABLET | ORAL | Status: DC

## 2012-07-08 MED ORDER — HYDROMORPHONE HCL PF 1 MG/ML IJ SOLN
1.0000 mg | Freq: Once | INTRAMUSCULAR | Status: AC
  Administered 2012-07-08: 1 mg via INTRAVENOUS
  Filled 2012-07-08: qty 1

## 2012-07-08 MED ORDER — DEXTROSE 5 % IV SOLN
1.0000 g | Freq: Once | INTRAVENOUS | Status: AC
  Administered 2012-07-08: 1 g via INTRAVENOUS
  Filled 2012-07-08: qty 10

## 2012-07-08 MED ORDER — ONDANSETRON HCL 4 MG PO TABS: 4.0000 mg | ORAL_TABLET | Freq: Three times a day (TID) | ORAL | Status: DC | PRN

## 2012-07-08 MED ORDER — IOHEXOL 300 MG/ML  SOLN
100.0000 mL | Freq: Once | INTRAMUSCULAR | Status: AC | PRN
  Administered 2012-07-08: 100 mL via INTRAVENOUS

## 2012-07-08 NOTE — ED Provider Notes (Signed)
Medical screening examination/treatment/procedure(s) were performed by non-physician practitioner and as supervising physician I was immediately available for consultation/collaboration.  Flint Melter, MD 07/08/12 [phone]

## 2012-07-08 NOTE — ED Notes (Signed)
Pt c/o of abdominal pain ,N/V and diarrhea. Last diarrhea episode this morning and last vomit 0300. Pt also complains of dizziness and lightheadedness.

## 2012-07-08 NOTE — ED Provider Notes (Signed)
History     CSN: [id number]  Arrival date & time 07/08/12  1012   First MD Initiated Contact with Patient 07/08/12 1121      Chief Complaint  Patient presents with  . Nausea  . Emesis  . Abdominal Pain    (Consider location/radiation/quality/duration/timing/severity/associated sxs/prior treatment) HPI  Leslie Golden is a 46 y.o. female complaining of severe abdominal pain x2days followed by multiple episodes of nonbloody nonbilious non-coffee ground emesis and diarrhea .  Patient denies fever, sick contacts, prior abdominal surgery.  Pain severe, 10/10, described as colicky no alleviating or exacerbating factors ID'd  Past Medical History  Diagnosis Date  . Hypertension     Past Surgical History  Procedure Date  . Eye surgery   . Vaginal wound closure / repair     No family history on file.  History  Substance Use Topics  . Smoking status: Current Every Day Smoker    Types: Cigarettes  . Smokeless tobacco: Not on file  . Alcohol Use: Yes     Comment: occ    OB History    Grav Para Term Preterm Abortions TAB SAB Ect Mult Living                  Review of Systems  Constitutional: Negative for fever.  Respiratory: Negative for shortness of breath.   Cardiovascular: Negative for chest pain.  Gastrointestinal: Positive for nausea, vomiting, abdominal pain and diarrhea.  Neurological: Positive for light-headedness.  All other systems reviewed and are negative.    Allergies  Review of patient's allergies indicates no known allergies.  Home Medications   Current Outpatient Rx  Name  Route  Sig  Dispense  Refill  . ACETAMINOPHEN 500 MG PO TABS   Oral   Take 500-1,000 mg by mouth every 6 (six) hours as needed. For pain           BP 117/88  Pulse 89  Temp 97.8 F (36.6 C) (Oral)  Resp 18  Wt 120 lb (54.432 kg)  SpO2 100%  Physical Exam  Nursing note and vitals reviewed. Constitutional: She is oriented to person, place, and time. She appears  well-developed and well-nourished. No distress.       Crying in a fetal position on her left side  HENT:  Head: Normocephalic and atraumatic.       Dry mucous membranes  Eyes: Conjunctivae normal and EOM are normal. Pupils are equal, round, and reactive to light.  Cardiovascular: Normal rate.   Pulmonary/Chest: Effort normal. No stridor.  Abdominal: She exhibits no distension and no mass. There is tenderness. There is guarding. There is no rebound.       No Peritoneal signs, voluntary guarding. Tenderness to palpation of right lower quadrant greater than right upper and suprapubic area. Positive psoas, positive obturator, negative Rovsing sign.  Genitourinary:       CVA tenderness on the right.  Musculoskeletal: Normal range of motion.  Neurological: She is alert and oriented to person, place, and time.  Psychiatric: She has a normal mood and affect.    ED Course  Procedures (including critical care time)  Labs Reviewed  URINALYSIS, ROUTINE W REFLEX MICROSCOPIC - Abnormal; Notable for the following:    APPearance CLOUDY (*)     Leukocytes, UA SMALL (*)     All other components within normal limits  COMPREHENSIVE METABOLIC PANEL - Abnormal; Notable for the following:    Total Bilirubin 0.1 (*)     GFR calc  non Af Amer 78 (*)     All other components within normal limits  CBC WITH DIFFERENTIAL - Abnormal; Notable for the following:    WBC 10.7 (*)     Hemoglobin 10.1 (*)     HCT 31.3 (*)     MCV 73.1 (*)     MCH 23.6 (*)     RDW 19.3 (*)     All other components within normal limits  URINE MICROSCOPIC-ADD ON - Abnormal; Notable for the following:    Bacteria, UA MANY (*)     All other components within normal limits  PREGNANCY, URINE  PROTIME-INR  URINE CULTURE   Ct Abdomen Pelvis W Contrast  07/08/2012  *RADIOLOGY REPORT*  Clinical Data: Nausea/vomiting, abdominal pain  CT ABDOMEN AND PELVIS WITH CONTRAST  Technique:  Multidetector CT imaging of the abdomen and pelvis was  performed following the standard protocol during bolus administration of intravenous contrast.  Contrast: OMNIPAQUE IOHEXOL 300 MG/ML  SOLN  Comparison: CT pelvis dated 12/05/2011.  CT abdomen pelvis dated 03/07/2011.  Findings: Lung bases are clear.  Scattered tiny probable hepatic cysts.  Spleen, pancreas, and adrenal glands within normal limits.  Gallbladder is underdistended.  No intrahepatic or extrahepatic ductal dilatation.  Focal scarring with parenchymal calcification in the left lateral kidney (series 2/image 21), unchanged.  Right kidney is unremarkable.  No hydronephrosis.  No evidence of bowel obstruction.  Normal appendix.  No evidence of abdominal aortic aneurysm.  No suspicious abdominopelvic lymphadenopathy.  Retroverted uterus.  Bilateral ovaries are unremarkable.  Trace pelvic ascites, likely physiologic.  Bladder is within normal limits.  Visualized osseous structures are within normal limits.  IMPRESSION: No evidence of bowel obstruction.  Normal appendix.  No CT findings to account for the patient's abdominal pain.   Original Report Authenticated By: Charline Bills, M.D.      1. Nausea & vomiting   2. Diarrhea   3. UTI (lower urinary tract infection)       MDM  Concern for appendicitis based on physical exam and will order a CT abdomen pelvis. Bloodwork is normal except for a mild leukocytosis of 10.7. Urinalysis shows a UTI. Will treat her with a gram of Rocephin and send a Cx.   Abdomen pelvis CT shows no acute abnormalities. Patient will be discharged home with Abx for UTI and nausea and pain control. Patient has passed by mouth challenge and is feeling significantly better at this time.         Wynetta Emery, PA-C 07/08/12 1525  Kristiana Jacko, PA-C 07/08/12 1526

## 2012-07-11 LAB — URINE CULTURE: Colony Count: >=100000

## 2012-07-12 NOTE — ED Notes (Signed)
+   Urine Patient treated with cipro-sensitive to same-chart appended per protocol MD. 

## 2012-07-22 ENCOUNTER — Inpatient Hospital Stay (HOSPITAL_COMMUNITY)
Admission: AD | Admit: 2012-07-22 | Discharge: 2012-07-26 | DRG: 690 | Disposition: A | Discharge status: Home / Self Care | Payer: MEDICAID | Source: Ambulatory Visit | Attending: Internal Medicine | Admitting: Internal Medicine

## 2012-07-22 ENCOUNTER — Inpatient Hospital Stay (HOSPITAL_COMMUNITY): Payer: Self-pay

## 2012-07-22 ENCOUNTER — Encounter (HOSPITAL_COMMUNITY): Payer: Self-pay

## 2012-07-22 DIAGNOSIS — F1994 Other psychoactive substance use, unspecified with psychoactive substance-induced mood disorder: Secondary | ICD-10-CM

## 2012-07-22 DIAGNOSIS — Z720 Tobacco use: Secondary | ICD-10-CM

## 2012-07-22 DIAGNOSIS — Z5900 Homelessness unspecified: Secondary | ICD-10-CM

## 2012-07-22 DIAGNOSIS — F1220 Cannabis dependence, uncomplicated: Secondary | ICD-10-CM

## 2012-07-22 DIAGNOSIS — I10 Essential (primary) hypertension: Secondary | ICD-10-CM | POA: Diagnosis present

## 2012-07-22 DIAGNOSIS — R1084 Generalized abdominal pain: Secondary | ICD-10-CM

## 2012-07-22 DIAGNOSIS — F319 Bipolar disorder, unspecified: Secondary | ICD-10-CM | POA: Diagnosis present

## 2012-07-22 DIAGNOSIS — E876 Hypokalemia: Secondary | ICD-10-CM | POA: Diagnosis present

## 2012-07-22 DIAGNOSIS — I160 Hypertensive urgency: Secondary | ICD-10-CM

## 2012-07-22 DIAGNOSIS — F17200 Nicotine dependence, unspecified, uncomplicated: Secondary | ICD-10-CM | POA: Diagnosis present

## 2012-07-22 DIAGNOSIS — N10 Acute tubulo-interstitial nephritis: Principal | ICD-10-CM | POA: Diagnosis present

## 2012-07-22 DIAGNOSIS — R109 Unspecified abdominal pain: Secondary | ICD-10-CM

## 2012-07-22 DIAGNOSIS — D649 Anemia, unspecified: Secondary | ICD-10-CM | POA: Diagnosis present

## 2012-07-22 LAB — CBC
HCT: 30.6 % — ABNORMAL LOW (ref 36.0–46.0)
Hemoglobin: 9.9 g/dL — ABNORMAL LOW (ref 12.0–15.0)
MCV: 72.5 fL — ABNORMAL LOW (ref 78.0–100.0)
RBC: 4.22 MIL/uL (ref 3.87–5.11)
RDW: 19.2 % — ABNORMAL HIGH (ref 11.5–15.5)
WBC: 14.9 10*3/uL — ABNORMAL HIGH (ref 4.0–10.5)

## 2012-07-22 LAB — COMPREHENSIVE METABOLIC PANEL
Albumin: 3.9 g/dL (ref 3.5–5.2)
Alkaline Phosphatase: 66 U/L (ref 39–117)
BUN: 7 mg/dL (ref 6–23)
CO2: 20 mEq/L (ref 19–32)
Chloride: 103 mEq/L (ref 96–112)
GFR calc non Af Amer: 80 mL/min — ABNORMAL LOW (ref >90)
Glucose, Bld: 94 mg/dL (ref 70–99)
Potassium: 4.6 mEq/L (ref 3.5–5.1)
Total Bilirubin: 0.5 mg/dL (ref 0.3–1.2)

## 2012-07-22 LAB — RAPID URINE DRUG SCREEN, HOSP PERFORMED
Amphetamines: NOT DETECTED
Barbiturates: NOT DETECTED
Benzodiazepines: NOT DETECTED

## 2012-07-22 LAB — URINALYSIS, ROUTINE W REFLEX MICROSCOPIC
Bilirubin Urine: NEGATIVE
Ketones, ur: NEGATIVE mg/dL
Nitrite: NEGATIVE
Protein, ur: NEGATIVE mg/dL
pH: 6.5 (ref 5.0–8.0)

## 2012-07-22 LAB — HCG, QUANTITATIVE, PREGNANCY: hCG, Beta Chain, Quant, S: <1 m[IU]/mL (ref <5)

## 2012-07-22 LAB — LIPASE, BLOOD: Lipase: 19 U/L (ref 11–59)

## 2012-07-22 MED ORDER — MORPHINE SULFATE 4 MG/ML IJ SOLN
2.0000 mg | Freq: Once | INTRAMUSCULAR | Status: AC
  Administered 2012-07-22: 2 mg via INTRAVENOUS
  Filled 2012-07-22: qty 1

## 2012-07-22 MED ORDER — HYDRALAZINE HCL 20 MG/ML IJ SOLN
5.0000 mg | INTRAMUSCULAR | Status: DC | PRN
  Administered 2012-07-22 – 2012-07-24 (×4): 5 mg via INTRAVENOUS
  Filled 2012-07-22: qty 0.25
  Filled 2012-07-22 (×3): qty 1

## 2012-07-22 MED ORDER — HYDROMORPHONE HCL PF 1 MG/ML IJ SOLN
1.0000 mg | Freq: Once | INTRAMUSCULAR | Status: AC
  Administered 2012-07-22: 1 mg via INTRAVENOUS
  Filled 2012-07-22: qty 1

## 2012-07-22 MED ORDER — LACTATED RINGERS IV SOLN
INTRAVENOUS | Status: DC
  Administered 2012-07-22: 125 mL/h via INTRAVENOUS

## 2012-07-22 MED ORDER — PANTOPRAZOLE SODIUM 40 MG IV SOLR
40.0000 mg | Freq: Once | INTRAVENOUS | Status: AC
  Administered 2012-07-22: 40 mg via INTRAVENOUS
  Filled 2012-07-22: qty 40

## 2012-07-22 MED ORDER — IOHEXOL 300 MG/ML  SOLN
100.0000 mL | Freq: Once | INTRAMUSCULAR | Status: AC | PRN
  Administered 2012-07-22: 100 mL via INTRAVENOUS

## 2012-07-22 MED ORDER — DEXTROSE 5 % IV SOLN
2.0000 g | Freq: Once | INTRAVENOUS | Status: AC
  Administered 2012-07-22: 2 g via INTRAVENOUS
  Filled 2012-07-22: qty 2

## 2012-07-22 MED ORDER — ONDANSETRON 8 MG/NS 50 ML IVPB
8.0000 mg | Freq: Once | INTRAVENOUS | Status: AC
  Administered 2012-07-22: 8 mg via INTRAVENOUS
  Filled 2012-07-22: qty 8

## 2012-07-22 MED ORDER — SODIUM CHLORIDE 0.9 % IV SOLN
INTRAVENOUS | Status: AC
  Administered 2012-07-22: 23:00:00 via INTRAVENOUS

## 2012-07-22 NOTE — MAU Note (Signed)
Pt states pain was lower, now increasing back to 7-8/10.

## 2012-07-22 NOTE — MAU Note (Signed)
Pt reports blood in diarrhea; yellow emesis; pain 10/10 right-sided pain; was seen at [facility] ED on 11/15 for UTI and GI virus; treated and has been like this for 2 weeks.

## 2012-07-22 NOTE — ED Notes (Signed)
EXB:[id number]<LK> Expected date:<BR> Expected time:<BR> Means of arrival:<BR> Comments:<BR> MAU, to be admitted

## 2012-07-22 NOTE — ED Notes (Signed)
MD at bedside. 

## 2012-07-22 NOTE — ED Notes (Signed)
Attempted to call report, RN to call back.

## 2012-07-22 NOTE — MAU Provider Note (Signed)
History     CSN: [id number]  Arrival date and time: 07/22/12 1136   First Provider Initiated Contact with Patient 07/22/12 1220      Chief Complaint  Patient presents with  . Abdominal Pain  . Emesis   HPI  Pt is having 10/10 RLQ pain x 2 weeks. She was seen in ER and treated for UTI and viral GI infection.   Past Medical History  Diagnosis Date  . Hypertension     Past Surgical History  Procedure Date  . Eye surgery   . Vaginal wound closure / repair     No family history on file.  History  Substance Use Topics  . Smoking status: Current Every Day Smoker    Types: Cigarettes  . Smokeless tobacco: Not on file  . Alcohol Use: Yes     Comment: occ    Allergies: No Known Allergies  Prescriptions prior to admission  Medication Sig Dispense Refill  . acetaminophen (TYLENOL) 500 MG tablet Take 500-1,000 mg by mouth every 6 (six) hours as needed. For pain      . cephALEXin (KEFLEX) 500 MG capsule Take 1 capsule (500 mg total) by mouth 2 (two) times daily.  20 capsule  0  . ondansetron (ZOFRAN) 4 MG tablet Take 1 tablet (4 mg total) by mouth every 8 (eight) hours as needed for nausea.  10 tablet  0  . oxyCODONE-acetaminophen (PERCOCET/ROXICET) 5-325 MG per tablet 1 to 2 tabs PO q6hrs  PRN for pain  15 tablet  0    Review of Systems  Constitutional: Positive for chills. Negative for fever.  Eyes: Positive for blurred vision.  Respiratory: Positive for cough (makes the pain worse) and shortness of breath.   Gastrointestinal: Positive for nausea, vomiting, abdominal pain, diarrhea and blood in stool.  Genitourinary: Negative for dysuria and urgency.  Neurological: Positive for headaches.   Physical Exam   Blood pressure 168/117, pulse 105, temperature 98.3 F (36.8 C), temperature source Oral, resp. rate 24, last menstrual period 06/26/2012, SpO2 100.00%.  Physical Exam  Nursing note and vitals reviewed. Constitutional: She is oriented to person, place, and  time. She appears well-developed and well-nourished.  Respiratory: Effort normal.  GI: She exhibits distension. There is tenderness. There is guarding.  Neurological: She is alert and oriented to person, place, and time.  Skin: Skin is warm and dry.   1600: Right sided tenderness and guarding present. Pain is 8/10. Discussed with the patient that all labs and CT are normal.   MAU Course  Procedures  1225: Dr. Shawnie Pons asked to evaluate the patient.  1245: Dr. Shawnie Pons in to see the patient. Plan for abdominal X-ray, get a recent weight on the patient, and urine drug screen. If free air seen on x-ray pt needs to be transferred to [facility]. If no free air seen we will repeat the CT scan.  1605. Dr. Shawnie Pons notified, and recommends transfer to [facility].  1611: ER Dr. Freida Busman reviewed labs and scans. Recommends transferring as a direct admit via the hospitalist.  1625: Spoke with Dr. Kennon Holter, and he recommends evaluation in the ED prior to direct admit.  1632: Spoke with Dr. Freida Busman. Transfer patient to [facility] ED. Give 1 mg dilaudid and 40 mg IV protonix.    *RADIOLOGY REPORT*  Clinical Data: Right lower quadrant pain. Diarrhea.  CT ABDOMEN AND PELVIS WITH CONTRAST  Technique: Multidetector CT imaging of the abdomen and pelvis was  performed following the standard protocol during bolus  administration  of intravenous contrast.  Contrast: 100 ml Omnipaque-300 and oral contrast  Comparison: None.  Findings: Normal gas filled appendix is visualized. No  inflammatory process or abnormal fluid collections are identified  within the abdomen or pelvis. No evidence of bowel wall thickening  or dilatation. No evidence of hernia.  Several tiny hepatic cysts are noted but no liver masses are  identified. Gallbladder is unremarkable. The pancreas, spleen,  adrenal glands, and right kidney are normal in appearance. A  nonobstructing left renal calculus is seen measuring 4 mm in the  mid pole. Mild left renal parenchymal scarring  is seen. No  evidence of hydronephrosis involving either kidney.  A small less than 1 cm fibroid noted the uterine fundus. Adnexa  are unremarkable in appearance. No soft tissue masses or  lymphadenopathy identified within the abdomen or pelvis.  IMPRESSION:  1. No evidence of appendicitis or other acute findings.  2. Tiny nonobstructing left renal calculus and mild left renal  scarring. No evidence of ureteral calculi or hydronephrosis.  3. Tiny less than 1 cm uterine fibroid.  Original Report Authenticated By: Myles Rosenthal, M.D.   Assessment and Plan   1. Abdominal pain in female patient   Transfer to [facility] ED via [facility] Pt left unit in stable condition.   Tawnya Crook 07/22/2012, 12:20 PM

## 2012-07-22 NOTE — ED Provider Notes (Signed)
History     CSN: [id number]  Arrival date & time 07/22/12  1136   First MD Initiated Contact with Patient 07/22/12 2048      Chief Complaint  Patient presents with  . Abdominal Pain  . Emesis    (Consider location/radiation/quality/duration/timing/severity/associated sxs/prior treatment) Patient is a 46 y.o. female presenting with abdominal pain and vomiting. The history is provided by the patient.  Abdominal Pain The primary symptoms of the illness include abdominal pain and vomiting.  Emesis  Associated symptoms include abdominal pain.   patient here with right-sided abdominal pain x1 week. No associated fever or chills. No urinary symptoms. No vaginal bleeding or discharge. Was seen at [facility] hospital prior to arrival and had an extensive workup consisting of n and abdominal CAT scan all of which was negative except for mild leukocytosis with a white count of 14.9 thousand. She was given IV analgesia without relief of her symptoms. She was sent her for further management. She denies any association with food. Symptoms are not worse with physicians. No hematuria or dysuria. She was seen in the department of ago for similar symptoms without diagnosis  Past Medical History  Diagnosis Date  . Hypertension     Past Surgical History  Procedure Date  . Eye surgery   . Vaginal wound closure / repair     History reviewed. No pertinent family history.  History  Substance Use Topics  . Smoking status: Current Every Day Smoker    Types: Cigarettes  . Smokeless tobacco: Not on file  . Alcohol Use: Yes     Comment: occ    OB History    Grav Para Term Preterm Abortions TAB SAB Ect Mult Living                  Review of Systems  Gastrointestinal: Positive for vomiting and abdominal pain.  All other systems reviewed and are negative.    Allergies  Review of patient's allergies indicates no known allergies.  Home Medications   Current Outpatient Rx  Name  Route  Sig   Dispense  Refill  . OXYCODONE-ACETAMINOPHEN 5-325 MG PO TABS      1 to 2 tabs PO q6hrs  PRN for pain   15 tablet   0     BP 196/103  Pulse 63  Temp 98.4 F (36.9 C) (Oral)  Resp 18  SpO2 100%  LMP 06/26/2012  Physical Exam  Nursing note and vitals reviewed. Constitutional: She is oriented to person, place, and time. She appears well-developed and well-nourished.  Non-toxic appearance. No distress.  HENT:  Head: Normocephalic and atraumatic.  Eyes: Conjunctivae normal, EOM and lids are normal. Pupils are equal, round, and reactive to light.  Neck: Normal range of motion. Neck supple. No tracheal deviation present. No mass present.  Cardiovascular: Normal rate, regular rhythm and normal heart sounds.  Exam reveals no gallop.   No murmur heard. Pulmonary/Chest: Effort normal and breath sounds normal. No stridor. No respiratory distress. She has no decreased breath sounds. She has no wheezes. She has no rhonchi. She has no rales.  Abdominal: Soft. Normal appearance and bowel sounds are normal. She exhibits no distension. There is tenderness in the right upper quadrant and right lower quadrant. There is guarding. There is no rigidity, no rebound and no CVA tenderness.  Musculoskeletal: Normal range of motion. She exhibits no edema and no tenderness.  Neurological: She is alert and oriented to person, place, and time. She has normal strength.  No cranial nerve deficit or sensory deficit. GCS eye subscore is 4. GCS verbal subscore is 5. GCS motor subscore is 6.  Skin: Skin is warm and dry. No abrasion and no rash noted.  Psychiatric: She has a normal mood and affect. Her speech is normal and behavior is normal.    ED Course  Procedures (including critical care time)  Labs Reviewed  CBC - Abnormal; Notable for the following:    WBC 14.9 (*)     Hemoglobin 9.9 (*)     HCT 30.6 (*)     MCV 72.5 (*)     MCH 23.5 (*)     RDW 19.2 (*)     All other components within normal limits    COMPREHENSIVE METABOLIC PANEL - Abnormal; Notable for the following:    GFR calc non Af Amer 80 (*)     All other components within normal limits  URINE RAPID DRUG SCREEN (HOSP PERFORMED) - Abnormal; Notable for the following:    Opiates POSITIVE (*)     Tetrahydrocannabinol POSITIVE (*)     All other components within normal limits  HCG, QUANTITATIVE, PREGNANCY  URINALYSIS, ROUTINE W REFLEX MICROSCOPIC  LIPASE, BLOOD   Dg Abd 1 View  07/22/2012  *RADIOLOGY REPORT*  Clinical Data: Right lower quadrant pain.  Diarrhea.  ABDOMEN - 1 VIEW  Comparison: None.  Findings: The bowel gas pattern is within normal limits.  No evidence of dilated bowel loops or free air.  No radiopaque calculi identified.  Several pelvic phleboliths noted.  IMPRESSION: No acute findings.   Original Report Authenticated By: Myles Rosenthal, M.D.    Ct Abdomen Pelvis W Contrast  07/22/2012  *RADIOLOGY REPORT*  Clinical Data: Right lower quadrant pain.  Diarrhea.  CT ABDOMEN AND PELVIS WITH CONTRAST  Technique:  Multidetector CT imaging of the abdomen and pelvis was performed following the standard protocol during bolus administration of intravenous contrast.  Contrast:  100 ml Omnipaque-300 and oral contrast  Comparison: None.  Findings: Normal gas filled appendix is visualized.  No inflammatory process or abnormal fluid collections are identified within the abdomen or pelvis.  No evidence of bowel wall thickening or dilatation.  No evidence of hernia.  Several tiny hepatic cysts are noted but no liver masses are identified.  Gallbladder is unremarkable.  The pancreas, spleen, adrenal glands, and right kidney are normal in appearance.  A nonobstructing left renal calculus is seen measuring 4 mm in the mid pole.  Mild left renal parenchymal scarring is seen.  No evidence of hydronephrosis involving either kidney.  A small less than 1 cm fibroid noted the uterine fundus.  Adnexa are unremarkable in appearance.  No soft tissue masses  or lymphadenopathy identified within the abdomen or pelvis.  IMPRESSION:  1.  No evidence of appendicitis or other acute findings. 2.  Tiny nonobstructing left renal calculus and mild left renal scarring.  No evidence of ureteral calculi or hydronephrosis. 3.  Tiny less than 1 cm uterine fibroid.   Original Report Authenticated By: Myles Rosenthal, M.D.      1. Abdominal pain in female patient       MDM  Patient given dilaudid here and will be admitted        Toy Baker, MD 07/22/12 2136

## 2012-07-22 NOTE — H&P (Addendum)
[facility] History and Physical  Leslie Golden:[id number] DOB: October 09, 1965 DOA: 07/22/2012  Referring physician: Freida Busman PCP: Kathreen Cosier, MD , Psychiatrist=Sarah Su Hilt Specialists: None  Chief Complaint: Multiple  HPI: Leslie Golden is a 46 y.o. female came to [facility] ed 07/22/2012 with Sotmach pains aon the R sid eof he rsotmach , diarrhoea and throwing up.  SHe states that she had a GI bug she thinks on NOv 15th and has been like this since then.  She saw some clots of blood in her sool and was having n and alternating diarrhoea as well. She relates she was diagnosed 11.15 with a UTI [which was staph]-she is not sure how much of the Keflex she was able to take since she was discharged from the emergency room 11 5013. She states that she has had a new oval episodes of nausea vomiting and has not been in to keep anything down orally. This morning she only had a cup of coffee. She's not been able to take her regular blood pressure medications, she has been feeling weak and was given Percocet for pain but this did not help. Denies any specific other issues except for intractable abdominal pain on the right side of the abdomen anteriorly and posteriorly. She qualifies the pain as being a tightening and not type of pain with spasms which come and go. She denies any dysuria or strangury. She states that the pain is 10 on 10 in intensity in unbearable and laying on her right side curled up into a ball makes her feel better. She denies any vaginal discharge any vaginal bleeding but states that she's had one to 2 episodes wherein her to stool seemed darker than usual-denies any overt blood in her stool.  She denies any blood transfusions or exposure to any STDs in the remote past-patient is currently postmenopausal  Emergency room evaluation revealed toxic screen positive for THC and opiates, 1 view abdominal x-ray showed no acute findings, CT scan abdomen and pelvis showed no evidence for  appendicitis or other findings, tiny nonobstructing left renal calculus and mild left renal scarring with no evidence of calculi or hydronephrosis an tiny 1 cm fibroid    Review of Systems: The patient denies any chest pain or shortness of breath although she does have a chronic cough secondary to smoking She denies any blurred vision double vision any weakness in any one side of the body. She does report that she has intractable nausea vomiting as per above and diarrhea. She states the diarrhea is watery and loose and is not dark colored but has been little darker lately. She denies any falls or any weakness in any one side of body She denies any yellowness of her eyes itching or scratching or exposure to hepatitis next  Past Medical History  Diagnosis Date  . Hypertension    Past Surgical History  Procedure Date  . Eye surgery   . 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 Heart failure Father     Died of a heart attack  . Heart failure Mother     Diet heart attack    Prior to Admission medications   Medication Sig Start  Date End Date Taking? Authorizing Provider  oxyCODONE-acetaminophen (PERCOCET/ROXICET) 5-325 MG per tablet 1 to 2 tabs PO q6hrs  PRN for pain 07/08/12  Yes Wynetta Emery, PA-C   Physical Exam: Filed Vitals:   07/22/12 1150 07/22/12 1506 07/22/12 1728 07/22/12 1945  BP: 168/117 185/108 181/99 196/103  Pulse: 105 73 70 63  Temp: 98.3 F (36.8 C) 98.2 F (36.8 C) 98.7 F (37.1 C) 98.4 F (36.9 C)  TempSrc: Oral Oral Oral Oral  Resp: 24 18 18 18   SpO2: 100%   100%   Alert African American female in painful distress No pallor no icterus, dentition is poor patient seems very frail Chest  clinically clear no added sounds S1-S2 no murmur rub or gallop normal sinus rhythm per exam Abdomen tender in the right quadrant and also there is CVA tenderness on the right side compared to the left with mild percussion Parachutist soft Neuro is grossly intact  Labs on Admission:  Basic Metabolic Panel:  Lab 07/22/12 1610  NA 138  K 4.6  CL 103  CO2 20  GLUCOSE 94  BUN 7  CREATININE 0.87  CALCIUM 9.4  MG --  PHOS --   Liver Function Tests:  Lab 07/22/12 1205  AST 16  ALT 7  ALKPHOS 66  BILITOT 0.5  PROT 7.7  ALBUMIN 3.9    Lab 07/22/12 1205  LIPASE 19  AMYLASE --   No results found for this basename: AMMONIA:5 in the last 168 hours CBC:  Lab 07/22/12 1205  WBC 14.9*  NEUTROABS --  HGB 9.9*  HCT 30.6*  MCV 72.5*  PLT 365   Cardiac Enzymes: No results found for this basename: CKTOTAL:5,CKMB:5,CKMBINDEX:5,TROPONINI:5 in the last 168 hours  BNP (last 3 results) No results found for this basename: PROBNP:3 in the last 8760 hours CBG: No results found for this basename: GLUCAP:5 in the last 168 hours  Radiological Exams on Admission: Dg Abd 1 View  07/22/2012  *RADIOLOGY REPORT*  Clinical Data: Right lower quadrant pain.  Diarrhea.  ABDOMEN - 1 VIEW  Comparison: None.  Findings: The bowel gas pattern is within normal limits.  No evidence of dilated bowel loops or free air.  No radiopaque calculi identified.  Several pelvic phleboliths noted.  IMPRESSION: No acute findings.   Original Report Authenticated By: Myles Rosenthal, M.D.    Ct Abdomen Pelvis W Contrast  07/22/2012  *RADIOLOGY REPORT*  Clinical Data: Right lower quadrant pain.  Diarrhea.  CT ABDOMEN AND PELVIS WITH CONTRAST  Technique:  Multidetector CT imaging of the abdomen and pelvis was performed following the standard protocol during bolus administration of intravenous contrast.  Contrast:  100 ml Omnipaque-300 and oral contrast  Comparison: None.  Findings: Normal gas filled appendix is visualized.   No inflammatory process or abnormal fluid collections are identified within the abdomen or pelvis.  No evidence of bowel wall thickening or dilatation.  No evidence of hernia.  Several tiny hepatic cysts are noted but no liver masses are identified.  Gallbladder is unremarkable.  The pancreas, spleen, adrenal glands, and right kidney are normal in appearance.  A nonobstructing left renal calculus is seen measuring 4 mm in the mid pole.  Mild left renal parenchymal scarring is seen.  No evidence of hydronephrosis involving either kidney.  A small less than 1 cm fibroid noted the uterine fundus.  Adnexa are unremarkable in appearance.  No soft tissue masses or lymphadenopathy identified within the abdomen or pelvis.  IMPRESSION:  1.  No evidence  of appendicitis or other acute findings. 2.  Tiny nonobstructing left renal calculus and mild left renal scarring.  No evidence of ureteral calculi or hydronephrosis. 3.  Tiny less than 1 cm uterine fibroid.   Original Report Authenticated By: Myles Rosenthal, M.D.     EKG: Independently reviewed. None performed indicated  Assessment/Plan Principal Problem:  *Pyelonephritis, acute Active Problems:  Bipolar 1 disorder  Tobacco use  Hypertensive urgency   1. Acute abdominal pain-Probable pyelonephritis[grew out 100,000 colony-forming units 07/08/12 of Staphylococcus aureus which was pansensitive]-differential diagnosis mesenteric ischemia versus torsion/Volvulus [both very unlikely] patient was in the emergency room 07/08/2029 given gram of Rocephin and sent home on Keflex and pain medication as well as Zofran. She's not able to complete the Keflex and her exam today is compatible with pyelonephritis given she is CVA tenderness. Will start 1 g of Rocephin Q. 24 hourly and and expect that she'll turn the corner in the next 1-2 days. If she does not get any better, I would recommend renal ultrasound and consultation with Gen. surgery given the severity of her  pain-certainly I think that her pain is a real issue given her blood pressure significantly uncontrolled and her vital signs are slightly deranged which would indicate organic abdominal pain-I will control her pain with Dilodid 2 mg every 2 when necessary initially. She'll be n.p.o. except for clear liquids and will get Phenergan versus Zofran IV 2. Bipolar disorder-patient has run out of her anxiolytics and we will prescribe her low dosages of Ativan 0.5 mg during hospital stay 3. Tobacco abuse-patient should be counseled to stop 4. Hypertensive urgency Asian has run out of her propanolol and has not taken this-given she is significant hypertensive we will administer the same at 20 mg to start with 5. ? Blood in stool-hemoglobin seems stable. Repeat tomorrow. Consider workup if drop sustained on next lab checks  none consultant consulted Code Status: Presumed full code  Family Communication:  None at bedside Disposition Plan: Inpatient 2-4 days   Time spent: 55 minutes  Mahala Menghini [facility] Pager [phone]  If 7PM-7AM, please contact night-coverage [email or web address] Password [facility] 07/22/2012, 9:47 PM

## 2012-07-22 NOTE — MAU Provider Note (Signed)
Chart reviewed and agree with management and plan.  

## 2012-07-23 ENCOUNTER — Encounter (HOSPITAL_COMMUNITY): Payer: Self-pay | Admitting: *Deleted

## 2012-07-23 DIAGNOSIS — I10 Essential (primary) hypertension: Secondary | ICD-10-CM

## 2012-07-23 DIAGNOSIS — N10 Acute tubulo-interstitial nephritis: Principal | ICD-10-CM

## 2012-07-23 DIAGNOSIS — R109 Unspecified abdominal pain: Secondary | ICD-10-CM

## 2012-07-23 LAB — COMPREHENSIVE METABOLIC PANEL
AST: 16 U/L (ref 0–37)
Albumin: 3.4 g/dL — ABNORMAL LOW (ref 3.5–5.2)
Alkaline Phosphatase: 54 U/L (ref 39–117)
Chloride: 100 mEq/L (ref 96–112)
Potassium: 3.4 mEq/L — ABNORMAL LOW (ref 3.5–5.1)
Total Bilirubin: 0.3 mg/dL (ref 0.3–1.2)

## 2012-07-23 LAB — CBC
HCT: 28.6 % — ABNORMAL LOW (ref 36.0–46.0)
MCH: 23 pg — ABNORMAL LOW (ref 26.0–34.0)
MCV: 72.4 fL — ABNORMAL LOW (ref 78.0–100.0)
Platelets: 325 10*3/uL (ref 150–400)
RBC: 3.95 MIL/uL (ref 3.87–5.11)
RDW: 19.3 % — ABNORMAL HIGH (ref 11.5–15.5)

## 2012-07-23 MED ORDER — ONDANSETRON HCL 4 MG/2ML IJ SOLN
4.0000 mg | Freq: Four times a day (QID) | INTRAMUSCULAR | Status: DC | PRN
  Administered 2012-07-23 – 2012-07-25 (×5): 4 mg via INTRAVENOUS
  Filled 2012-07-23 (×5): qty 2

## 2012-07-23 MED ORDER — DEXTROSE 5 % IV SOLN: 1.0000 g | Freq: Every day | INTRAVENOUS | Status: DC

## 2012-07-23 MED ORDER — HYDROMORPHONE HCL PF 1 MG/ML IJ SOLN
1.0000 mg | INTRAMUSCULAR | Status: DC | PRN
  Administered 2012-07-23: 2 mg via INTRAVENOUS
  Administered 2012-07-23: 1 mg via INTRAVENOUS
  Administered 2012-07-23: 2 mg via INTRAVENOUS
  Filled 2012-07-23: qty 2
  Filled 2012-07-23: qty 1
  Filled 2012-07-23: qty 2

## 2012-07-23 MED ORDER — PANTOPRAZOLE SODIUM 40 MG IV SOLR
40.0000 mg | INTRAVENOUS | Status: DC
  Administered 2012-07-23 – 2012-07-24 (×2): 40 mg via INTRAVENOUS
  Filled 2012-07-23 (×3): qty 40

## 2012-07-23 MED ORDER — PROMETHAZINE HCL 25 MG PO TABS
12.5000 mg | ORAL_TABLET | Freq: Four times a day (QID) | ORAL | Status: DC | PRN
  Administered 2012-07-23: 12.5 mg via ORAL
  Filled 2012-07-23: qty 1

## 2012-07-23 MED ORDER — LEVOFLOXACIN IN D5W 750 MG/150ML IV SOLN
750.0000 mg | INTRAVENOUS | Status: DC
  Administered 2012-07-23 – 2012-07-25 (×3): 750 mg via INTRAVENOUS
  Filled 2012-07-23 (×4): qty 150

## 2012-07-23 MED ORDER — HEPARIN SODIUM (PORCINE) 5000 UNIT/ML IJ SOLN
5000.0000 [IU] | Freq: Three times a day (TID) | INTRAMUSCULAR | Status: DC
  Administered 2012-07-23 (×2): 5000 [IU] via SUBCUTANEOUS
  Filled 2012-07-23 (×5): qty 1

## 2012-07-23 MED ORDER — POTASSIUM CHLORIDE CRYS ER 20 MEQ PO TBCR
40.0000 meq | EXTENDED_RELEASE_TABLET | Freq: Once | ORAL | Status: AC
  Administered 2012-07-23: 40 meq via ORAL
  Filled 2012-07-23: qty 2

## 2012-07-23 MED ORDER — MORPHINE SULFATE 2 MG/ML IJ SOLN
1.0000 mg | INTRAMUSCULAR | Status: DC | PRN
  Administered 2012-07-23 – 2012-07-24 (×5): 1 mg via INTRAVENOUS
  Filled 2012-07-23 (×5): qty 1

## 2012-07-23 MED ORDER — SODIUM CHLORIDE 0.9 % IV SOLN
INTRAVENOUS | Status: DC
  Administered 2012-07-24: 17:00:00 via INTRAVENOUS

## 2012-07-23 NOTE — Progress Notes (Addendum)
[facility] PROGRESS NOTE  Leslie Golden EXB:[id number] DOB: Sep 02, 1965 DOA: 07/22/2012 PCP: Kathreen Cosier, MD  Assessment/Plan: 1. Abdominal Pain: This could be secondary to Pyelo, unlikely mesenteric ischemia with normal CT scan and normal lactic acid. Also she relates diarrhea and was on antibiotics, will check C diff. Also in the differential gastroenteritis. Unlikely Inflammatory bowel diseases with normal CT scan.  2. Pyelonephritis ? : Will continue with antibiotics.   UA was normal, but this might be because she was on antibiotics. Prior urine culture from 11-15 grew staph Coagulase negative sensitive to Levaquin. I will change ceftriaxone to Levaquin.  3. Diarrhea; Check C diff.  4. Hypertension: PRN Hydralazine.  5. Hypokalemia: replaced.   Code Status: Full. Family Communication: Care discussed with patient.     Consultants:  None.   Procedures:  None  Antibiotics:  Ceftriaxone 11-29 --- 11-30  Levaquin 11-30  HPI/Subjective: Still complaining of abdominal pain and nausea. She feels IV dilaudid is causing her nausea. She relates abdominal pain for last 2 weeks. Also relates diarrhea. Had 1 Watery stool this am. She wants to eat regular food. She had similar pain like this in 2004 and was treated for pyelonephritis. She was seen in the ED 11-15 and was treated for UTI.   Objective: Filed Vitals:   07/23/12 0323 07/23/12 0537 07/23/12 1027 07/23/12 1358  BP: 137/64 153/89 153/76 190/93  Pulse: 62 68 58 61  Temp: 98.2 F (36.8 C) 98.2 F (36.8 C) 98.4 F (36.9 C) 98.3 F (36.8 C)  TempSrc: Oral Oral Oral Oral  Resp: 16 16 16 16   Height:      Weight:      SpO2: 100% 100% 100% 100%    Intake/Output Summary (Last 24 hours) at 07/23/12 1512 Last data filed at 07/23/12 0920  Gross per 24 hour  Intake      0 ml  Output      0 ml  Net      0 ml   Filed Weights   07/23/12 0042  Weight: 54.432 kg (120 lb)    Exam:   General:  No  distress.  Cardiovascular:  S1, S2 RRR  Respiratory: CTA  Abdomen: Bs present, abdomen soft, right lower quadrant tenderness, no rigidity,   Data Reviewed: Basic Metabolic Panel:  Lab 07/23/12 1027 07/22/12 1205  NA 136 138  K 3.4* 4.6  CL 100 103  CO2 25 20  GLUCOSE 99 94  BUN 6 7  CREATININE 0.85 0.87  CALCIUM 8.8 9.4  MG -- --  PHOS -- --   Liver Function Tests:  Lab 07/23/12 0456 07/22/12 1205  AST 16 16  ALT 7 7  ALKPHOS 54 66  BILITOT 0.3 0.5  PROT 7.0 7.7  ALBUMIN 3.4* 3.9    Lab 07/22/12 1205  LIPASE 19  AMYLASE --   No results found for this basename: AMMONIA:5 in the last 168 hours CBC:  Lab 07/23/12 0456 07/22/12 1205  WBC 11.1* 14.9*  NEUTROABS -- --  HGB 9.1* 9.9*  HCT 28.6* 30.6*  MCV 72.4* 72.5*  PLT 325 365   Cardiac Enzymes: No results found for this basename: CKTOTAL:5,CKMB:5,CKMBINDEX:5,TROPONINI:5 in the last 168 hours BNP (last 3 results) No results found for this basename: PROBNP:3 in the last 8760 hours CBG: No results found for this basename: GLUCAP:5 in the last 168 hours  No results found for this or any previous visit (from the past 240 hour(s)).   Studies: Dg Abd 1 View  07/22/2012  *RADIOLOGY REPORT*  Clinical Data: Right lower quadrant pain.  Diarrhea.  ABDOMEN - 1 VIEW  Comparison: None.  Findings: The bowel gas pattern is within normal limits.  No evidence of dilated bowel loops or free air.  No radiopaque calculi identified.  Several pelvic phleboliths noted.  IMPRESSION: No acute findings.   Original Report Authenticated By: Myles Rosenthal, M.D.    Ct Abdomen Pelvis W Contrast  07/22/2012  *RADIOLOGY REPORT*  Clinical Data: Right lower quadrant pain.  Diarrhea.  CT ABDOMEN AND PELVIS WITH CONTRAST  Technique:  Multidetector CT imaging of the abdomen and pelvis was performed following the standard protocol during bolus administration of intravenous contrast.  Contrast:  100 ml Omnipaque-300 and oral contrast  Comparison:  None.  Findings: Normal gas filled appendix is visualized.  No inflammatory process or abnormal fluid collections are identified within the abdomen or pelvis.  No evidence of bowel wall thickening or dilatation.  No evidence of hernia.  Several tiny hepatic cysts are noted but no liver masses are identified.  Gallbladder is unremarkable.  The pancreas, spleen, adrenal glands, and right kidney are normal in appearance.  A nonobstructing left renal calculus is seen measuring 4 mm in the mid pole.  Mild left renal parenchymal scarring is seen.  No evidence of hydronephrosis involving either kidney.  A small less than 1 cm fibroid noted the uterine fundus.  Adnexa are unremarkable in appearance.  No soft tissue masses or lymphadenopathy identified within the abdomen or pelvis.  IMPRESSION:  1.  No evidence of appendicitis or other acute findings. 2.  Tiny nonobstructing left renal calculus and mild left renal scarring.  No evidence of ureteral calculi or hydronephrosis. 3.  Tiny less than 1 cm uterine fibroid.   Original Report Authenticated By: Myles Rosenthal, M.D.     Scheduled Meds:   . [COMPLETED] sodium chloride   Intravenous STAT  . [COMPLETED] cefTRIAXone (ROCEPHIN)  IV  2 g Intravenous Once  . heparin  5,000 Units Subcutaneous Q8H  . [COMPLETED]  HYDROmorphone (DILAUDID) injection  1 mg Intravenous Once  . [COMPLETED]  HYDROmorphone (DILAUDID) injection  1 mg Intravenous Once  . levofloxacin (LEVAQUIN) IV  750 mg Intravenous Q24H  . [COMPLETED] pantoprazole (PROTONIX) IV  40 mg Intravenous Once  . pantoprazole (PROTONIX) IV  40 mg Intravenous Q24H  . [DISCONTINUED] cefTRIAXone (ROCEPHIN)  IV  1 g Intravenous QHS   Continuous Infusions:   . sodium chloride 75 mL/hr (07/23/12 1414)  . [DISCONTINUED] lactated ringers Stopped (07/22/12 2247)    Principal Problem:  *Pyelonephritis, acute Active Problems:  Bipolar 1 disorder  Tobacco use  Hypertensive urgency    Time spent: 35  minutes    Whitnie Deleon  [facility] Pager [phone]. If 8PM-8AM, please contact night-coverage at [email or web address], password [facility] 07/23/2012, 3:12 PM  LOS: 1 day    6-Anemia: last year hb at 11. Will check anemia panel, and occult blood. HB stable since yesterday. Patient reported small amount of blood notice day prior to admission.

## 2012-07-24 DIAGNOSIS — F319 Bipolar disorder, unspecified: Secondary | ICD-10-CM

## 2012-07-24 DIAGNOSIS — F1994 Other psychoactive substance use, unspecified with psychoactive substance-induced mood disorder: Secondary | ICD-10-CM

## 2012-07-24 MED ORDER — MIRTAZAPINE 15 MG PO TABS
15.0000 mg | ORAL_TABLET | Freq: Every day | ORAL | Status: DC
  Administered 2012-07-25 (×2): 15 mg via ORAL
  Filled 2012-07-24 (×3): qty 1

## 2012-07-24 MED ORDER — ENSURE COMPLETE PO LIQD
237.0000 mL | Freq: Two times a day (BID) | ORAL | Status: DC
  Administered 2012-07-24 – 2012-07-25 (×3): 237 mL via ORAL

## 2012-07-24 NOTE — Progress Notes (Signed)
INITIAL ADULT NUTRITION ASSESSMENT Date: 07/24/2012   Time: 12:35 PM  Reason for Assessment: Malnutrition screening tool, score of 3.   INTERVENTION: 1. Ensure Complete BID 2. RD to follow nutrition plan of care.  ASSESSMENT: Female 46 y.o.  Dx: Pyelonephritis, acute  Hx:  Past Medical History  Diagnosis Date  . Hypertension   . Bipolar 1 disorder     Related Meds:     . levofloxacin (LEVAQUIN) IV  750 mg Intravenous Q24H  . pantoprazole (PROTONIX) IV  40 mg Intravenous Q24H  . [COMPLETED] potassium chloride  40 mEq Oral Once  . [DISCONTINUED] heparin  5,000 Units Subcutaneous Q8H    Ht: 5\' 6"  (167.6 cm)  Wt: 120 lb (54.432 kg)  Ideal Wt: 59.3 kg  % Ideal Wt: 92%  Usual Wt: 56.8 kg % Usual Wt: 96%  Body mass index is 19.37 kg/(m^2). Patient is normal weight.   Food/Nutrition Related Hx: Patient with a GI bug 2 weeks ago, admitted with nausea, vomiting, and diarrhea. She reports that her appetite is improved slightly. She was able to tolerate breakfast without vomiting. She does complain of some abdominal cramping. Weight loss of 4% UBW over 2 weeks with oral intake <75% of estimated needs meets criteria for non-severe malnutrition in the context of acute illness.   Labs:  CMP     Component Value Date/Time   NA 136 07/23/2012 0456   K 3.4* 07/23/2012 0456   CL 100 07/23/2012 0456   CO2 25 07/23/2012 0456   GLUCOSE 99 07/23/2012 0456   BUN 6 07/23/2012 0456   CREATININE 0.85 07/23/2012 0456   CALCIUM 8.8 07/23/2012 0456   PROT 7.0 07/23/2012 0456   ALBUMIN 3.4* 07/23/2012 0456   AST 16 07/23/2012 0456   ALT 7 07/23/2012 0456   ALKPHOS 54 07/23/2012 0456   BILITOT 0.3 07/23/2012 0456   GFRNONAA 82* 07/23/2012 0456   GFRAA >90 07/23/2012 0456     Intake/Output Summary (Last 24 hours) at 07/24/12 1239 Last data filed at 07/24/12 1100  Gross per 24 hour  Intake   1145 ml  Output      1 ml  Net   1144 ml    Diet Order: General,  85%  Supplements/Tube Feeding: none  IVF:    sodium chloride Last Rate: 75 mL/hr at 07/23/12 1500    Estimated Nutritional Needs:   Kcal: 1600-1700 kcal Protein: 65-75 g Fluid: >1.9 L  NUTRITION DIAGNOSIS: -Malnutrition (NI-5.2).  Status: Ongoing  RELATED TO: decreased appetite  AS EVIDENCE BY: 4% weight loss over 2 weeks with oral intake <75% of estimated needs   MONITORING/EVALUATION(Goals): Patient will meet 90-100% of estimated nutrition needs  Monitor: PO intake, weight, labs, I/O's  EDUCATION NEEDS: -No education needs identified at this time    Linnell Fulling, RD, LDN Pager #: [phone] After-Hours Pager #: [phone]   DOCUMENTATION CODES Per approved criteria  -Non-severe (moderate) malnutrition in the context of acute illness or injury    Fabio Pierce 07/24/2012, 12:35 PM

## 2012-07-24 NOTE — Progress Notes (Signed)
[facility] PROGRESS NOTE  Leslie Golden ZOX:[id number] DOB: [birth year] DOA: 07/22/2012 PCP: Kathreen Cosier, MD  Assessment/Plan:  1. Abdominal Pain: This could be secondary to Pyelo,normal CT scan and normal lactic acid. Diarrhea resolved. 2. Pyelonephritis ? : Will continue with antibiotics. UA was normal, but this might be because she was on antibiotics. Prior urine culture from 11-15 grew staph Coagulase negative sensitive to Levaquin. ceftriaxone change to  Levaquin. Day 2 antibiotics.  3. Diarrhea; Resolved. 4. Hypertension: PRN Hydralazine.  5. Hypokalemia: replaced. Refused lab drawn this am.         6-Anemia: last year hb at 11. Will check anemia panel, and occult blood. HB stable since admission. Patient reported small amount of blood notice day prior to admission. No more BM. Will need follow up with GI outpatient.        7-Depression; psych consult.    Code Status: Full.  Family Communication: Care discussed with patient.  Consultants:  None.  Procedures:  None Antibiotics:  Ceftriaxone 11-29 --- 11-30  Levaquin 11-30  HPI/Subjective: Abdominal pain better. Diarrhea resolved. \ Feeling depressed, denies suicidal thought.  Agree to speak with psych.   Objective: Filed Vitals:   07/23/12 1700 07/23/12 2104 07/24/12 0544 07/24/12 1352  BP: 163/96 147/84 154/97 168/103  Pulse:  87 87 80  Temp:  98.7 F (37.1 C) 98.7 F (37.1 C) 98.9 F (37.2 C)  TempSrc:  Oral Oral Oral  Resp:  18 18 16   Height:      Weight:      SpO2:  100% 100% 100%    Intake/Output Summary (Last 24 hours) at 07/24/12 1426 Last data filed at 07/24/12 1300  Gross per 24 hour  Intake   1145 ml  Output      1 ml  Net   1144 ml   Filed Weights   07/23/12 0042  Weight: 54.432 kg (120 lb)    Exam:  General: No distress.  Cardiovascular: S1, S2 RRR  Respiratory: CTA  Abdomen: Bs present, abdomen soft, right lower quadrant tenderness, no rigidity,    Data Reviewed: Basic  Metabolic Panel:  Lab 07/23/12 8119 07/22/12 1205  NA 136 138  K 3.4* 4.6  CL 100 103  CO2 25 20  GLUCOSE 99 94  BUN 6 7  CREATININE 0.85 0.87  CALCIUM 8.8 9.4  MG -- --  PHOS -- --   Liver Function Tests:  Lab 07/23/12 0456 07/22/12 1205  AST 16 16  ALT 7 7  ALKPHOS 54 66  BILITOT 0.3 0.5  PROT 7.0 7.7  ALBUMIN 3.4* 3.9    Lab 07/22/12 1205  LIPASE 19  AMYLASE --   No results found for this basename: AMMONIA:5 in the last 168 hours CBC:  Lab 07/23/12 0456 07/22/12 1205  WBC 11.1* 14.9*  NEUTROABS -- --  HGB 9.1* 9.9*  HCT 28.6* 30.6*  MCV 72.4* 72.5*  PLT 325 365   Cardiac Enzymes: No results found for this basename: CKTOTAL:5,CKMB:5,CKMBINDEX:5,TROPONINI:5 in the last 168 hours BNP (last 3 results) No results found for this basename: PROBNP:3 in the last 8760 hours CBG: No results found for this basename: GLUCAP:5 in the last 168 hours  No results found for this or any previous visit (from the past 240 hour(s)).   Studies: Ct Abdomen Pelvis W Contrast  07/22/2012  *RADIOLOGY REPORT*  Clinical Data: Right lower quadrant pain.  Diarrhea.  CT ABDOMEN AND PELVIS WITH CONTRAST  Technique:  Multidetector CT imaging of the  abdomen and pelvis was performed following the standard protocol during bolus administration of intravenous contrast.  Contrast:  100 ml Omnipaque-300 and oral contrast  Comparison: None.  Findings: Normal gas filled appendix is visualized.  No inflammatory process or abnormal fluid collections are identified within the abdomen or pelvis.  No evidence of bowel wall thickening or dilatation.  No evidence of hernia.  Several tiny hepatic cysts are noted but no liver masses are identified.  Gallbladder is unremarkable.  The pancreas, spleen, adrenal glands, and right kidney are normal in appearance.  A nonobstructing left renal calculus is seen measuring 4 mm in the mid pole.  Mild left renal parenchymal scarring is seen.  No evidence of hydronephrosis  involving either kidney.  A small less than 1 cm fibroid noted the uterine fundus.  Adnexa are unremarkable in appearance.  No soft tissue masses or lymphadenopathy identified within the abdomen or pelvis.  IMPRESSION:  1.  No evidence of appendicitis or other acute findings. 2.  Tiny nonobstructing left renal calculus and mild left renal scarring.  No evidence of ureteral calculi or hydronephrosis. 3.  Tiny less than 1 cm uterine fibroid.   Original Report Authenticated By: Myles Rosenthal, M.D.     Scheduled Meds:   . feeding supplement  237 mL Oral BID BM  . levofloxacin (LEVAQUIN) IV  750 mg Intravenous Q24H  . pantoprazole (PROTONIX) IV  40 mg Intravenous Q24H  . [COMPLETED] potassium chloride  40 mEq Oral Once  . [DISCONTINUED] heparin  5,000 Units Subcutaneous Q8H   Continuous Infusions:   . sodium chloride 75 mL/hr at 07/23/12 1500    Principal Problem:  *Pyelonephritis, acute Active Problems:  Bipolar 1 disorder  Tobacco use  Hypertensive urgency    Time spent: 25 minut    Leslie Golden,Leslie Golden  [facility] Pager [phone]. If 8PM-8AM, please contact night-coverage at [email or web address], password [facility] 07/24/2012, 2:26 PM  LOS: 2 days
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
## 2012-07-24 NOTE — Progress Notes (Signed)
Patient refused AM lab draw for BMP.
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
## 2012-07-25 DIAGNOSIS — F1220 Cannabis dependence, uncomplicated: Secondary | ICD-10-CM

## 2012-07-25 MED ORDER — PANTOPRAZOLE SODIUM 40 MG PO TBEC
40.0000 mg | DELAYED_RELEASE_TABLET | Freq: Every day | ORAL | Status: DC
  Administered 2012-07-25: 40 mg via ORAL
  Filled 2012-07-25 (×2): qty 1

## 2012-07-25 NOTE — Progress Notes (Signed)
PHARMACIST - PHYSICIAN COMMUNICATION CONCERNING: IV to Oral Route Change Policy  RECOMMENDATION: This patient is receiving Protonix by the intravenous route.  Based on criteria approved by the Pharmacy and Therapeutics Committee, this drug is/are being converted to the equivalent oral dose form(s):  . The patient is eating (either orally or per tube) and/or has been taking other orally administered medications for at least 24 hours.  . This patient has no evidence of active gastrointestinal bleeding or impaired GI absorption (gastrectomy, short bowel, patient on TNA or NPO).   If you have questions about this conversion, please contact the pharmacy department.  If you have questions about this conversion, please contact the Pharmacy Department  []  ( [phone] )  [place] []  ( [phone] )  [place]  []  ( [phone] )  [facility] [x]  ( [phone])  [facility]  

## 2012-07-25 NOTE — Progress Notes (Signed)
Attempted to meet with Pt.  Pt in the shower.  CSW to continue to follow.  Providence Crosby, LCSWA Clinical Social Work [phone]
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
## 2012-07-25 NOTE — Progress Notes (Signed)
[facility] PROGRESS NOTE  Leslie Golden ZOX:[id number] DOB: 07-May-1966 DOA: 07/22/2012 PCP: Kathreen Cosier, MD   Assessment/Plan:  1. Abdominal Pain: This could be secondary to Pyelo ? gastroenteritis,normal CT scan and normal lactic acid. Diarrhea resolved. 2. Pyelonephritis ? : Will continue with antibiotics. UA was normal, but this might be because she was on antibiotics. Prior urine culture from 11-15 grew staph Coagulase negative sensitive to Levaquin. ceftriaxone change to Levaquin. Day 3 antibiotics. Will transition to po tomorrow if nausea better.  3. Diarrhea; Resolved. 4. Hypertension: PRN Hydralazine.  5. Hypokalemia: replaced. Repeat labs.       6.  Anemia: last year hb at 9. Will check anemia panel, and occult blood. HB stable since admission. Patient reported small amount of blood notice day prior to admission. No more BM. Will need follow up with GI outpatient. Patient refuse lab drawn. I advised her to follow up with GI.   7-Depression; Appreciate psych. Started on mirtazapine.  Code Status: Full.  Family Communication: Care discussed with patient.  Consultants:  None.  Procedures:  None Antibiotics:  Ceftriaxone 11-29 --- 11-30  Levaquin 11-30  HPI/Subjective: Patient relates imp[rovement abdominal pain. Now is only on palpation.  Feel nauseated, no diarrhea no vomiting.   Objective: Filed Vitals:   07/24/12 2045 07/25/12 0610 07/25/12 1350 07/25/12 1358  BP: 137/87 146/96 153/102 154/90  Pulse: 79 68 104   Temp: 99 F (37.2 C) 98.4 F (36.9 C) 98.9 F (37.2 C)   TempSrc: Oral Oral Oral   Resp: 18 18 18    Height:      Weight:      SpO2: 100% 100% 100%     Intake/Output Summary (Last 24 hours) at 07/25/12 1433 Last data filed at 07/25/12 1300  Gross per 24 hour  Intake    880 ml  Output      0 ml  Net    880 ml   Filed Weights   07/23/12 0042  Weight: 54.432 kg (120 lb)    Exam:  General: No distress.  Cardiovascular: S1, S2 RRR  Respiratory: CTA  Abdomen: Bs present, abdomen soft, right lower quadrant tenderness, no rigidity,    Data Reviewed: Basic Metabolic Panel:  Lab 07/23/12 8119 07/22/12 1205  NA 136 138  K 3.4* 4.6  CL 100 103  CO2 25 20  GLUCOSE 99 94  BUN 6 7  CREATININE 0.85 0.87  CALCIUM 8.8 9.4  MG -- --  PHOS -- --   Liver Function Tests:  Lab 07/23/12 0456 07/22/12 1205  AST 16 16  ALT 7 7  ALKPHOS 54 66  BILITOT 0.3 0.5  PROT 7.0 7.7  ALBUMIN 3.4* 3.9    Lab 07/22/12 1205  LIPASE 19  AMYLASE --   No results found for this basename: AMMONIA:5 in the last 168 hours CBC:  Lab 07/23/12 0456 07/22/12 1205  WBC 11.1* 14.9*  NEUTROABS -- --  HGB 9.1* 9.9*  HCT 28.6* 30.6*  MCV 72.4* 72.5*  PLT 325 365   Cardiac Enzymes: No results found for this basename: CKTOTAL:5,CKMB:5,CKMBINDEX:5,TROPONINI:5 in the last 168 hours BNP (last 3 results) No results found for this basename: PROBNP:3 in the last 8760 hours CBG: No results found for this basename: GLUCAP:5 in the last 168 hours  No results found for this or any previous visit (from the past 240 hour(s)).   Studies: No results found.  Scheduled Meds:   . feeding supplement  237 mL Oral BID BM  .  levofloxacin (LEVAQUIN) IV  750 mg Intravenous Q24H  . mirtazapine  15 mg Oral QHS  . pantoprazole  40 mg Oral Daily  . [DISCONTINUED] pantoprazole (PROTONIX) IV  40 mg Intravenous Q24H   Continuous Infusions:   . sodium chloride 75 mL/hr at 07/24/12 1727    Principal Problem:  *Pyelonephritis, acute Active Problems:  Substance induced mood disorder  Tobacco use  Hypertensive urgency  Marijuana dependence    Time spent: 25 minutes    Sherrie Marsan  [facility] Pager [phone]. If 8PM-8AM, please contact night-coverage at [email or web address], password [facility] 07/25/2012, 2:33 PM  LOS: 3 days

## 2012-07-26 LAB — BASIC METABOLIC PANEL
BUN: 11 mg/dL (ref 6–23)
Calcium: 9.3 mg/dL (ref 8.4–10.5)
Creatinine, Ser: 0.95 mg/dL (ref 0.50–1.10)
GFR calc non Af Amer: 72 mL/min — ABNORMAL LOW (ref >90)
Glucose, Bld: 94 mg/dL (ref 70–99)
Sodium: 138 mEq/L (ref 135–145)

## 2012-07-26 LAB — CBC
HCT: 30.1 % — ABNORMAL LOW (ref 36.0–46.0)
Hemoglobin: 9.9 g/dL — ABNORMAL LOW (ref 12.0–15.0)
MCH: 23.8 pg — ABNORMAL LOW (ref 26.0–34.0)
MCHC: 32.9 g/dL (ref 30.0–36.0)
MCV: 72.4 fL — ABNORMAL LOW (ref 78.0–100.0)
Platelets: 398 10*3/uL (ref 150–400)
RBC: 4.16 MIL/uL (ref 3.87–5.11)
RDW: 19.8 % — ABNORMAL HIGH (ref 11.5–15.5)
WBC: 8.8 10*3/uL (ref 4.0–10.5)

## 2012-07-26 MED ORDER — PROMETHAZINE HCL 12.5 MG PO TABS: 12.5000 mg | ORAL_TABLET | Freq: Three times a day (TID) | ORAL | Status: DC | PRN

## 2012-07-26 MED ORDER — FERROUS SULFATE 325 (65 FE) MG PO TABS: 325.0000 mg | ORAL_TABLET | Freq: Every day | ORAL | Status: DC

## 2012-07-26 MED ORDER — LEVOFLOXACIN 500 MG PO TABS: 500.0000 mg | ORAL_TABLET | Freq: Every day | ORAL | Status: DC

## 2012-07-26 MED ORDER — MIRTAZAPINE 15 MG PO TABS: 15.0000 mg | ORAL_TABLET | Freq: Every day | ORAL | Status: DC

## 2012-07-26 NOTE — Discharge Summary (Signed)
Physician Discharge Summary  JAMES LAFALCE ZOX:[id number] DOB: [birth year] DOA: 07/22/2012  PCP: Kathreen Cosier, MD  Admit date: 07/22/2012 Discharge date: 07/26/2012  Time spent: 35  minutes  Recommendations for Outpatient Follow-up:  1. Follow up with PCP for work up of anemia and resolution of UTI.  Discharge Diagnoses:   Pyelonephritis, acute  Substance induced mood disorder  Tobacco use  Hypertensive urgency  Marijuana dependence   Discharge Condition: Improved.  Diet recommendation: Regular diet.   Filed Weights   07/23/12 0042  Weight: 54.432 kg (120 lb)    History of present illness:  46 year old presents with abdominal pain, nausea, vomiting and diarrhea.   Hospital Course:   1. Abdominal Pain: This could be secondary to Pyelo ? gastroenteritis,normal CT scan and normal lactic acid. Diarrhea resolved. 2. Pyelonephritis ? : Will continue with antibiotics. UA was normal, but this might be because she was on antibiotics. Prior urine culture from 11-15 grew staph Coagulase negative sensitive to Levaquin. ceftriaxone was change to Levaquin. Received 3 days IV antibiotics. Will transition to oral day on discharge.  3. Diarrhea; Resolved. 4. Hypertension: PRN Hydralazine.  5. Hypokalemia: replaced. Repeat labs.       6. Anemia: last year hb at 9. Will check anemia panel, and occult blood. HB                   stable since admission. Patient reported small amount of blood notice day                  prior to admission. No more BM. Will need follow up with GI outpatient.                      Patient refuse lab drawn. I advised her to follow up with GI.         7-Depression; Appreciate psych. Started on mirtazapine   Procedures:  None  Consultations:  None  Discharge Exam: Filed Vitals:   07/25/12 1350 07/25/12 1358 07/25/12 2127 07/26/12 0631  BP: 153/102 154/90 146/95 137/85  Pulse: 104  85 76  Temp: 98.9 F (37.2 C)  98 F (36.7 C) 98.2 F (36.8 C)   TempSrc: Oral  Oral Oral  Resp: 18  18 18   Height:      Weight:      SpO2: 100%  100% 99%    General: No distress. Cardiovascular: S 1. S 2 RRR Respiratory: CTA.  Discharge Instructions  Discharge Orders    Future Orders Please Complete By Expires   Diet - low sodium heart healthy      Increase activity slowly          Medication List     As of 07/26/2012 11:19 AM    TAKE these medications         ferrous sulfate 325 (65 FE) MG tablet   Take 1 tablet (325 mg total) by mouth daily with breakfast.      levofloxacin 500 MG tablet   Commonly known as: LEVAQUIN   Take 1 tablet (500 mg total) by mouth daily.      mirtazapine 15 MG tablet   Commonly known as: REMERON   Take 1 tablet (15 mg total) by mouth at bedtime.      oxyCODONE-acetaminophen 5-325 MG per tablet   Commonly known as: PERCOCET/ROXICET   1 to 2 tabs PO q6hrs  PRN for pain      promethazine 12.5 MG tablet  Commonly known as: PHENERGAN   Take 1 tablet (12.5 mg total) by mouth every 8 (eight) hours as needed for nausea.          The results of significant diagnostics from this hospitalization (including imaging, microbiology, ancillary and laboratory) are listed below for reference.    Significant Diagnostic Studies: Dg Abd 1 View  07/22/2012  *RADIOLOGY REPORT*  Clinical Data: Right lower quadrant pain.  Diarrhea.  ABDOMEN - 1 VIEW  Comparison: None.  Findings: The bowel gas pattern is within normal limits.  No evidence of dilated bowel loops or free air.  No radiopaque calculi identified.  Several pelvic phleboliths noted.  IMPRESSION: No acute findings.   Original Report Authenticated By: Myles Rosenthal, M.D.    Ct Abdomen Pelvis W Contrast  07/22/2012  *RADIOLOGY REPORT*  Clinical Data: Right lower quadrant pain.  Diarrhea.  CT ABDOMEN AND PELVIS WITH CONTRAST  Technique:  Multidetector CT imaging of the abdomen and pelvis was performed following the standard protocol during bolus administration of  intravenous contrast.  Contrast:  100 ml Omnipaque-300 and oral contrast  Comparison: None.  Findings: Normal gas filled appendix is visualized.  No inflammatory process or abnormal fluid collections are identified within the abdomen or pelvis.  No evidence of bowel wall thickening or dilatation.  No evidence of hernia.  Several tiny hepatic cysts are noted but no liver masses are identified.  Gallbladder is unremarkable.  The pancreas, spleen, adrenal glands, and right kidney are normal in appearance.  A nonobstructing left renal calculus is seen measuring 4 mm in the mid pole.  Mild left renal parenchymal scarring is seen.  No evidence of hydronephrosis involving either kidney.  A small less than 1 cm fibroid noted the uterine fundus.  Adnexa are unremarkable in appearance.  No soft tissue masses or lymphadenopathy identified within the abdomen or pelvis.  IMPRESSION:  1.  No evidence of appendicitis or other acute findings. 2.  Tiny nonobstructing left renal calculus and mild left renal scarring.  No evidence of ureteral calculi or hydronephrosis. 3.  Tiny less than 1 cm uterine fibroid.   Original Report Authenticated By: Myles Rosenthal, M.D.    Ct Abdomen Pelvis W Contrast  07/08/2012  *RADIOLOGY REPORT*  Clinical Data: Nausea/vomiting, abdominal pain  CT ABDOMEN AND PELVIS WITH CONTRAST  Technique:  Multidetector CT imaging of the abdomen and pelvis was performed following the standard protocol during bolus administration of intravenous contrast.  Contrast: OMNIPAQUE IOHEXOL 300 MG/ML  SOLN  Comparison: CT pelvis dated 12/05/2011.  CT abdomen pelvis dated 03/07/2011.  Findings: Lung bases are clear.  Scattered tiny probable hepatic cysts.  Spleen, pancreas, and adrenal glands within normal limits.  Gallbladder is underdistended.  No intrahepatic or extrahepatic ductal dilatation.  Focal scarring with parenchymal calcification in the left lateral kidney (series 2/image 21), unchanged.  Right kidney is  unremarkable.  No hydronephrosis.  No evidence of bowel obstruction.  Normal appendix.  No evidence of abdominal aortic aneurysm.  No suspicious abdominopelvic lymphadenopathy.  Retroverted uterus.  Bilateral ovaries are unremarkable.  Trace pelvic ascites, likely physiologic.  Bladder is within normal limits.  Visualized osseous structures are within normal limits.  IMPRESSION: No evidence of bowel obstruction.  Normal appendix.  No CT findings to account for the patient's abdominal pain.   Original Report Authenticated By: Charline Bills, M.D.     Microbiology: No results found for this or any previous visit (from the past 240 hour(s)).   Labs: Basic Metabolic  Panel:  Lab 07/26/12 1008 07/23/12 0456 07/22/12 1205  NA 138 136 138  K 3.9 3.4* 4.6  CL 102 100 103  CO2 25 25 20   GLUCOSE 94 99 94  BUN 11 6 7   CREATININE 0.95 0.85 0.87  CALCIUM 9.3 8.8 9.4  MG -- -- --  PHOS -- -- --   Liver Function Tests:  Lab 07/23/12 0456 07/22/12 1205  AST 16 16  ALT 7 7  ALKPHOS 54 66  BILITOT 0.3 0.5  PROT 7.0 7.7  ALBUMIN 3.4* 3.9    Lab 07/22/12 1205  LIPASE 19  AMYLASE --   No results found for this basename: AMMONIA:5 in the last 168 hours CBC:  Lab 07/26/12 1008 07/23/12 0456 07/22/12 1205  WBC 8.8 11.1* 14.9*  NEUTROABS -- -- --  HGB 9.9* 9.1* 9.9*  HCT 30.1* 28.6* 30.6*  MCV 72.4* 72.4* 72.5*  PLT 398 325 365   Cardiac Enzymes: No results found for this basename: CKTOTAL:5,CKMB:5,CKMBINDEX:5,TROPONINI:5 in the last 168 hours BNP: BNP (last 3 results) No results found for this basename: PROBNP:3 in the last 8760 hours CBG: No results found for this basename: GLUCAP:5 in the last 168 hours     Signed:  Yazmyn Valbuena  [facility] 07/26/2012, 11:19 AM

## 2012-07-26 NOTE — Progress Notes (Signed)
Clinical Social Work Department CLINICAL SOCIAL WORK PSYCHIATRY SERVICE LINE ASSESSMENT 07/26/2012  Patient:  Leslie Golden  Account:  [id number]  Admit Date:  07/22/2012  Clinical Social Worker:  Doroteo Glassman  Date/Time:  07/26/2012 01:38 PM Referred by:  Physician  Date referred:  07/25/2012 Reason for Referral  Behavioral Health Issues   Presenting Symptoms/Problems (In the person's/family's own words):   medication mgmt    Abuse/Neglect/Trauma Comments:   Psychiatric History (check all that apply)  Inpatient/hospitilization  Outpatient treatment   Psychiatric medications:  Remeron   Current Mental Health Hospitalizations/Previous Mental Health History:   Current provider:   Darral Dash   Place and Date:   Pt has an appt on the 16th at [facility] with Lyndel Safe   Current Medications:   see H&P   Previous Impatient Admission/Date/Reason:   Pt received inpt tx in [place] in 1992.   Emotional Health / Current Symptoms    Suicide/Self Harm  None reported   Suicide attempt in the past:   Reports that she was hospitalized in [place] in 1992 for "killing" her stuffed animal.   Other harmful behavior:    Other Psychotic/Dissociative Symptoms:    Attention/Behavioral Symptoms  Within Normal Limits   Other Attention / Behavioral Symptoms:    Cognitive Impairment  Within Normal Limits   Other Cognitive Impairment:    Mood and Adjustment  Euthymic    Stress, Anxiety, Trauma, Any Recent Loss/Stressor  Other - See comment   Anxiety (frequency):   Phobia (specify):   Compulsive behavior (specify):   Obsessive behavior (specify):   Other:   homelessness  medical problems   Substance Abuse/Use  Current substance use   SBIRT completed (please refer for detailed history):  N  Self-reported substance use:   Urinary Drug Screen Completed:  N Alcohol level:    Environmental/Housing/Living Arrangement  HOMELESS   Who is in the home:    Emergency contact:  mother   Financial  Medicaid   Patient's Strengths and Goals (patient's own words):   Clinical Social Worker's Interpretive Summary:   Met with Pt to discuss current admission.    Pt reports that she has been on several psychotropic meds but that nothing has helped.  She stated that Zoloft made her "Dr. Domenick Gong and Mr. Sheppard Plumber."  Pt reports that she has been diagnosed with Bipolar but that she's never been on a mood stablizer.    Pt states that she has an an oupt appt at [facility] with Lyndel Safe; she has been seeing Mrs. Su Hilt for a period of time.  Pt states that she has every intention of going to this appt, as she realizes the value of being on, and taking properly, the right medications.  Additionally, Pt states that she has things from her past that she is ready to process.    CSW thanked Pt for her time.    No further psych CSW needs.    CSW to signoff.   Disposition:  Psych Clinical Social Worker signing off  [place], [place] Clinical Social Work [phone]

## 2012-07-26 NOTE — Care Management Note (Signed)
    Page 1 of 1   07/26/2012     1:38:06 PM   CARE MANAGEMENT NOTE 07/26/2012  Patient:  Leslie Golden, Leslie Golden   Account Number:  [id number]  Date Initiated:  07/25/2012  Documentation initiated by:  Lorenda Ishihara  Subjective/Objective Assessment:   46 yo female admitted with pyelonephritis, failed OP UTI treatment. PTA lived at a motel.     Action/Plan:   Homeless, CSW to see for resources   Anticipated DC Date:  07/26/2012   Anticipated DC Plan:  HOME/SELF CARE  In-house referral  Clinical Social Worker      DC Associate Professor  CM consult  [facility] Program  PCP issues      Choice offered to / List presented to:             Status of service:  Completed, signed off Medicare Important Message given?   (If response is "NO", the following Medicare IM given date fields will be blank) Date Medicare IM given:   Date Additional Medicare IM given:    Discharge Disposition:  HOME/SELF CARE  Per UR Regulation:  Reviewed for med. necessity/level of care/duration of stay  If discussed at Long Length of Stay Meetings, dates discussed:    Comments:  07-26-12 Lorenda Ishihara RN CM 1300 Spoke with patient at bedside. States she is currently homeless, was living in a motel, this is no longer an option. She is unemployed, has no family, no resources for meds or healthcare. States she expects to get some unemployment income at the end of the week. Process medication assistance through [facility] program with the assistance of Prudence Davidson. Patient states no resources for copay, patient encouraged to utilize family, friends, church. Patient adamant that she has no resources, provided override for copay. Patient also provided resource for PCP f/u through [facility]gent [facility] at [facility]. Patient to f/u in one week with [facility]gent [facility]. CSW assisting patient with transportation resources and housing assistance. Patient instructed to go to [facility] OP pharmacy to obtain meds at d/c.

## 2012-12-27 ENCOUNTER — Emergency Department (HOSPITAL_COMMUNITY)
Admission: EM | Admit: 2012-12-27 | Discharge: 2012-12-27 | Disposition: A | Discharge status: Home / Self Care | Payer: Self-pay | Attending: Emergency Medicine | Admitting: Emergency Medicine

## 2012-12-27 ENCOUNTER — Encounter (HOSPITAL_COMMUNITY): Payer: Self-pay | Admitting: Emergency Medicine

## 2012-12-27 ENCOUNTER — Emergency Department (HOSPITAL_COMMUNITY): Payer: Self-pay

## 2012-12-27 DIAGNOSIS — F17200 Nicotine dependence, unspecified, uncomplicated: Secondary | ICD-10-CM | POA: Insufficient documentation

## 2012-12-27 DIAGNOSIS — R42 Dizziness and giddiness: Secondary | ICD-10-CM | POA: Insufficient documentation

## 2012-12-27 DIAGNOSIS — Z79899 Other long term (current) drug therapy: Secondary | ICD-10-CM | POA: Insufficient documentation

## 2012-12-27 DIAGNOSIS — F319 Bipolar disorder, unspecified: Secondary | ICD-10-CM | POA: Insufficient documentation

## 2012-12-27 DIAGNOSIS — R002 Palpitations: Secondary | ICD-10-CM

## 2012-12-27 DIAGNOSIS — I10 Essential (primary) hypertension: Secondary | ICD-10-CM | POA: Insufficient documentation

## 2012-12-27 DIAGNOSIS — A599 Trichomoniasis, unspecified: Secondary | ICD-10-CM

## 2012-12-27 LAB — URINALYSIS, ROUTINE W REFLEX MICROSCOPIC
Glucose, UA: NEGATIVE mg/dL
Protein, ur: 30 mg/dL — AB

## 2012-12-27 LAB — BASIC METABOLIC PANEL
CO2: 26 mEq/L (ref 19–32)
Calcium: 9.3 mg/dL (ref 8.4–10.5)
Chloride: 101 mEq/L (ref 96–112)
Potassium: 3.8 mEq/L (ref 3.5–5.1)
Sodium: 135 mEq/L (ref 135–145)

## 2012-12-27 LAB — URINE MICROSCOPIC-ADD ON

## 2012-12-27 LAB — CBC
MCH: 22.9 pg — ABNORMAL LOW (ref 26.0–34.0)
Platelets: 343 10*3/uL (ref 150–400)
RBC: 4.55 MIL/uL (ref 3.87–5.11)
WBC: 12.3 10*3/uL — ABNORMAL HIGH (ref 4.0–10.5)

## 2012-12-27 LAB — PREGNANCY, URINE: Preg Test, Ur: NEGATIVE

## 2012-12-27 MED ORDER — IBUPROFEN 800 MG PO TABS
800.0000 mg | ORAL_TABLET | Freq: Once | ORAL | Status: AC
  Administered 2012-12-27: 800 mg via ORAL
  Filled 2012-12-27: qty 1

## 2012-12-27 MED ORDER — SODIUM CHLORIDE 0.9 % IV BOLUS (SEPSIS)
1000.0000 mL | Freq: Once | INTRAVENOUS | Status: AC
  Administered 2012-12-27: 1000 mL via INTRAVENOUS

## 2012-12-27 MED ORDER — METRONIDAZOLE 500 MG PO TABS
2000.0000 mg | ORAL_TABLET | Freq: Once | ORAL | Status: AC
  Administered 2012-12-27: 2000 mg via ORAL
  Filled 2012-12-27: qty 4

## 2012-12-27 MED ORDER — PROPRANOLOL HCL 20 MG PO TABS: 20.0000 mg | ORAL_TABLET | Freq: Every day | ORAL | Status: DC

## 2012-12-27 MED ORDER — HYDROCODONE-ACETAMINOPHEN 5-325 MG PO TABS: 2.0000 | ORAL_TABLET | Freq: Four times a day (QID) | ORAL | Status: DC | PRN

## 2012-12-27 NOTE — ED Notes (Signed)
Pt discharged.Vital signs stable and GCS 15 

## 2012-12-27 NOTE — ED Provider Notes (Signed)
History     CSN: [id number]  Arrival date & time 12/27/12  1053   First MD Initiated Contact with Patient 12/27/12 1124      Chief Complaint  Patient presents with  . Palpitations  . Dizziness    (Consider location/radiation/quality/duration/timing/severity/associated sxs/prior treatment) Patient is a 47 y.o. female presenting with palpitations.  Palpitations    Pt with history of HTN was talking propranolol until about a week ago when she ran out. Reports she was driving to work this morning when she had sudden onset of dizziness, lightheadedness, blurry vision and heart racing. She stopped the car, got out to get some fresh air and symptoms resolved after about . She did not have any CP during that time. She had return of symptoms a couple of hours later and so she drove herself to the ED for eval. Feeling better now. Again no chest pains. She reports similar symptoms in the past attributed to anxiety.   Past Medical History  Diagnosis Date  . Hypertension   . Bipolar 1 disorder     Past Surgical History  Procedure Laterality Date  . Eye surgery    . Vaginal wound closure / repair      Family History  Problem Relation Age of Onset  . Heart failure Father     Died of a heart attack  . Heart failure Mother     Diet heart attack    History  Substance Use Topics  . Smoking status: Current Every Day Smoker -- .5 years    Types: Cigarettes  . Smokeless tobacco: Not on file  . Alcohol Use: Yes     Comment: occ marijuana    OB History   Grav Para Term Preterm Abortions TAB SAB Ect Mult Living                  Review of Systems  Cardiovascular: Positive for palpitations.   All other systems reviewed and are negative except as noted in HPI.   Allergies  Review of patient's allergies indicates no known allergies.  Home Medications   Current Outpatient Rx  Name  Route  Sig  Dispense  Refill  . ferrous sulfate 325 (65 FE) MG tablet   Oral   Take 1  tablet (325 mg total) by mouth daily with breakfast.   30 tablet   0   . levofloxacin (LEVAQUIN) 500 MG tablet   Oral   Take 1 tablet (500 mg total) by mouth daily.   10 tablet   0   . mirtazapine (REMERON) 15 MG tablet   Oral   Take 1 tablet (15 mg total) by mouth at bedtime.   30 tablet   0   . oxyCODONE-acetaminophen (PERCOCET/ROXICET) 5-325 MG per tablet      1 to 2 tabs PO q6hrs  PRN for pain   15 tablet   0   . promethazine (PHENERGAN) 12.5 MG tablet   Oral   Take 1 tablet (12.5 mg total) by mouth every 8 (eight) hours as needed for nausea.   30 tablet   0   . EXPIRED: promethazine (PHENERGAN) 25 MG tablet   Oral   Take 1 tablet (25 mg total) by mouth every 6 (six) hours as needed for nausea.   30 tablet   0     BP 172/99  Pulse 88  Temp(Src) 98.6 F (37 C) (Oral)  Resp 18  SpO2 99%  Physical Exam  Nursing note and vitals reviewed.  Constitutional: She is oriented to person, place, and time. She appears well-developed and well-nourished.  HENT:  Head: Normocephalic and atraumatic.  Eyes: EOM are normal. Pupils are equal, round, and reactive to light.  Neck: Normal range of motion. Neck supple.  Cardiovascular: Normal rate, normal heart sounds and intact distal pulses.   Pulmonary/Chest: Effort normal and breath sounds normal.  Abdominal: Bowel sounds are normal. She exhibits no distension. There is no tenderness.  Musculoskeletal: Normal range of motion. She exhibits no edema and no tenderness.  Neurological: She is alert and oriented to person, place, and time. She has normal strength. No cranial nerve deficit or sensory deficit.  Skin: Skin is warm and dry. No rash noted.  Psychiatric: She has a normal mood and affect.    ED Course  Procedures (including critical care time)  Labs Reviewed  CBC - Abnormal; Notable for the following:    WBC 12.3 (*)    Hemoglobin 10.4 (*)    HCT 31.5 (*)    MCV 69.2 (*)    MCH 22.9 (*)    RDW 19.0 (*)    All  other components within normal limits  BASIC METABOLIC PANEL - Abnormal; Notable for the following:    GFR calc non Af Amer 77 (*)    GFR calc Af Amer 89 (*)    All other components within normal limits  URINALYSIS, ROUTINE W REFLEX MICROSCOPIC - Abnormal; Notable for the following:    Color, Urine RED (*)    APPearance TURBID (*)    Hgb urine dipstick LARGE (*)    Bilirubin Urine SMALL (*)    Ketones, ur 15 (*)    Protein, ur 30 (*)    Leukocytes, UA MODERATE (*)    All other components within normal limits  URINE MICROSCOPIC-ADD ON - Abnormal; Notable for the following:    Squamous Epithelial / LPF FEW (*)    Bacteria, UA FEW (*)    All other components within normal limits  URINE CULTURE  PREGNANCY, URINE  POCT I-STAT TROPONIN I   Dg Chest 2 View  12/27/2012  *RADIOLOGY REPORT*  Clinical Data: Chest pain, palpitations and dizziness.  CHEST - 2 VIEW  Comparison: 09/20/2010 and prior chest radiographs dating back to 01/05/1999  Findings: The cardiomediastinal silhouette is unremarkable. Hyperinflation/ possible COPD noted. Calcified granulomas within the lungs noted. There is no evidence of focal airspace disease, pulmonary edema, suspicious pulmonary nodule/mass, pleural effusion, or pneumothorax. No acute bony abnormalities are identified.  IMPRESSION: No evidence of active cardiopulmonary disease.  Question COPD.   Original Report Authenticated By: Harmon Pier, M.D.      1. Palpitations   2. Trichomonas       MDM    Date: 12/27/2012  Rate: 93  Rhythm: normal sinus rhythm  QRS Axis: normal  Intervals: normal  ST/T Wave abnormalities: normal  Conduction Disutrbances: none  Narrative Interpretation: unremarkable, no old to compare  1:13 PM Pt has not had any tachyarrhythmias in the ED. Could be brief periods of SVT vs panic attack. She is now also complaining of lower abdominal cramping she attributes to her menses. She has TNTC blood in UA consistent with menstruation. She  denies any dysuria or frequency and has not had any vaginal discharge. She reports no unprotected sex in the last month. Informed of incidental reported trichomonas in the UA. Advised [facility] up with [facility] for further STD check. She would like to be treated for Trich today.  Charles B. Bernette Mayers, MD 12/27/12 1319

## 2012-12-27 NOTE — ED Notes (Signed)
Pt sts palpitations starting today with some dizziness; pt sts no htn meds x 1 week

## 2012-12-28 LAB — URINE CULTURE: Colony Count: 6000

## 2013-01-04 ENCOUNTER — Emergency Department (HOSPITAL_COMMUNITY)
Admission: EM | Admit: 2013-01-04 | Discharge: 2013-01-04 | Disposition: A | Discharge status: Home / Self Care | Payer: Self-pay | Attending: Emergency Medicine | Admitting: Emergency Medicine

## 2013-01-04 ENCOUNTER — Encounter (HOSPITAL_COMMUNITY): Payer: Self-pay | Admitting: *Deleted

## 2013-01-04 DIAGNOSIS — F319 Bipolar disorder, unspecified: Secondary | ICD-10-CM | POA: Insufficient documentation

## 2013-01-04 DIAGNOSIS — Z862 Personal history of diseases of the blood and blood-forming organs and certain disorders involving the immune mechanism: Secondary | ICD-10-CM | POA: Insufficient documentation

## 2013-01-04 DIAGNOSIS — D649 Anemia, unspecified: Secondary | ICD-10-CM | POA: Insufficient documentation

## 2013-01-04 DIAGNOSIS — I10 Essential (primary) hypertension: Secondary | ICD-10-CM | POA: Insufficient documentation

## 2013-01-04 DIAGNOSIS — R531 Weakness: Secondary | ICD-10-CM

## 2013-01-04 DIAGNOSIS — F17200 Nicotine dependence, unspecified, uncomplicated: Secondary | ICD-10-CM | POA: Insufficient documentation

## 2013-01-04 DIAGNOSIS — F411 Generalized anxiety disorder: Secondary | ICD-10-CM | POA: Insufficient documentation

## 2013-01-04 DIAGNOSIS — R5381 Other malaise: Secondary | ICD-10-CM | POA: Insufficient documentation

## 2013-01-04 DIAGNOSIS — N920 Excessive and frequent menstruation with regular cycle: Secondary | ICD-10-CM | POA: Insufficient documentation

## 2013-01-04 DIAGNOSIS — F419 Anxiety disorder, unspecified: Secondary | ICD-10-CM

## 2013-01-04 LAB — COMPREHENSIVE METABOLIC PANEL
ALT: 6 U/L (ref 0–35)
AST: 16 U/L (ref 0–37)
Calcium: 9 mg/dL (ref 8.4–10.5)
Sodium: 138 mEq/L (ref 135–145)
Total Protein: 7.3 g/dL (ref 6.0–8.3)

## 2013-01-04 LAB — CBC WITH DIFFERENTIAL/PLATELET
Basophils Absolute: 0 10*3/uL (ref 0.0–0.1)
Basophils Relative: 0 % (ref 0–1)
Eosinophils Absolute: 0.1 10*3/uL (ref 0.0–0.7)
Eosinophils Relative: 1 % (ref 0–5)
MCH: 22.4 pg — ABNORMAL LOW (ref 26.0–34.0)
MCHC: 31.8 g/dL (ref 30.0–36.0)
MCV: 70.5 fL — ABNORMAL LOW (ref 78.0–100.0)
Platelets: 305 10*3/uL (ref 150–400)
RDW: 19.6 % — ABNORMAL HIGH (ref 11.5–15.5)
WBC: 9.5 10*3/uL (ref 4.0–10.5)

## 2013-01-04 MED ORDER — LORAZEPAM 1 MG PO TABS
1.0000 mg | ORAL_TABLET | Freq: Once | ORAL | Status: AC
  Administered 2013-01-04: 1 mg via ORAL
  Filled 2013-01-04: qty 1

## 2013-01-04 NOTE — ED Notes (Signed)
Pt states she was seen at [facility] a week ago, still having same symptoms, "feeling sluggish", "feels like eyes are closing on their own", pt states "I feel like my body is shutting down, I don't feel like me". Pt states her blood pressure was high at [facility] and has been taking her medication like prescribed.
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
## 2013-01-04 NOTE — Progress Notes (Signed)
[facility] has seen patient and given her the Primary Care Resources list.

## 2013-04-25 ENCOUNTER — Encounter (HOSPITAL_COMMUNITY): Payer: Self-pay

## 2013-04-25 ENCOUNTER — Emergency Department (HOSPITAL_COMMUNITY)
Admission: EM | Admit: 2013-04-25 | Discharge: 2013-04-25 | Discharge status: Against Medical Advice | Payer: Self-pay | Attending: Emergency Medicine | Admitting: Emergency Medicine

## 2013-04-25 DIAGNOSIS — Z043 Encounter for examination and observation following other accident: Secondary | ICD-10-CM | POA: Insufficient documentation

## 2013-04-25 DIAGNOSIS — Z8659 Personal history of other mental and behavioral disorders: Secondary | ICD-10-CM | POA: Insufficient documentation

## 2013-04-25 DIAGNOSIS — Y9389 Activity, other specified: Secondary | ICD-10-CM | POA: Insufficient documentation

## 2013-04-25 DIAGNOSIS — Z79899 Other long term (current) drug therapy: Secondary | ICD-10-CM | POA: Insufficient documentation

## 2013-04-25 DIAGNOSIS — F17200 Nicotine dependence, unspecified, uncomplicated: Secondary | ICD-10-CM | POA: Insufficient documentation

## 2013-04-25 DIAGNOSIS — Y92410 Unspecified street and highway as the place of occurrence of the external cause: Secondary | ICD-10-CM | POA: Insufficient documentation

## 2013-04-25 DIAGNOSIS — I10 Essential (primary) hypertension: Secondary | ICD-10-CM | POA: Insufficient documentation
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
## 2013-04-25 NOTE — ED Notes (Signed)
Called once for fast track. No reply.
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
## 2013-05-13 NOTE — ED Provider Notes (Signed)
Medical screening examination/treatment/procedure(s) were performed by non-physician practitioner and as supervising physician I was immediately available for consultation/collaboration.   Junius Argyle, MD 05/13/13 [phone]

## 2013-05-16 NOTE — ED Provider Notes (Signed)
Medical screening examination/treatment/procedure(s) were performed by non-physician practitioner and as supervising physician I was immediately available for consultation/collaboration.   Adreanne Yono S Giordan Fordham, MD 05/16/13 1058 

## 2013-05-16 NOTE — ED Provider Notes (Signed)
Medical screening examination/treatment/procedure(s) were performed by non-physician practitioner and as supervising physician I was immediately available for consultation/collaboration.   Megan E Docherty, MD 05/16/13 1105 

## 2013-07-27 ENCOUNTER — Inpatient Hospital Stay (HOSPITAL_COMMUNITY)
Admission: EM | Admit: 2013-07-27 | Discharge: 2013-07-30 | DRG: 872 | Disposition: A | Discharge status: Home / Self Care | Payer: Self-pay | Attending: Internal Medicine | Admitting: Internal Medicine

## 2013-07-27 ENCOUNTER — Encounter (HOSPITAL_COMMUNITY): Payer: Self-pay | Admitting: Emergency Medicine

## 2013-07-27 DIAGNOSIS — D72829 Elevated white blood cell count, unspecified: Secondary | ICD-10-CM

## 2013-07-27 DIAGNOSIS — IMO0001 Reserved for inherently not codable concepts without codable children: Secondary | ICD-10-CM | POA: Diagnosis present

## 2013-07-27 DIAGNOSIS — A419 Sepsis, unspecified organism: Principal | ICD-10-CM | POA: Diagnosis present

## 2013-07-27 DIAGNOSIS — R509 Fever, unspecified: Secondary | ICD-10-CM

## 2013-07-27 DIAGNOSIS — F17200 Nicotine dependence, unspecified, uncomplicated: Secondary | ICD-10-CM | POA: Diagnosis present

## 2013-07-27 DIAGNOSIS — I10 Essential (primary) hypertension: Secondary | ICD-10-CM | POA: Diagnosis present

## 2013-07-27 DIAGNOSIS — N10 Acute tubulo-interstitial nephritis: Secondary | ICD-10-CM | POA: Diagnosis present

## 2013-07-27 DIAGNOSIS — N920 Excessive and frequent menstruation with regular cycle: Secondary | ICD-10-CM | POA: Diagnosis present

## 2013-07-27 DIAGNOSIS — F319 Bipolar disorder, unspecified: Secondary | ICD-10-CM | POA: Diagnosis present

## 2013-07-27 DIAGNOSIS — E860 Dehydration: Secondary | ICD-10-CM | POA: Diagnosis present

## 2013-07-27 DIAGNOSIS — D509 Iron deficiency anemia, unspecified: Secondary | ICD-10-CM | POA: Diagnosis present

## 2013-07-27 DIAGNOSIS — E876 Hypokalemia: Secondary | ICD-10-CM | POA: Diagnosis present

## 2013-07-27 DIAGNOSIS — F419 Anxiety disorder, unspecified: Secondary | ICD-10-CM | POA: Diagnosis present

## 2013-07-27 DIAGNOSIS — F411 Generalized anxiety disorder: Secondary | ICD-10-CM | POA: Diagnosis present

## 2013-07-27 DIAGNOSIS — Z8249 Family history of ischemic heart disease and other diseases of the circulatory system: Secondary | ICD-10-CM

## 2013-07-27 DIAGNOSIS — Z720 Tobacco use: Secondary | ICD-10-CM

## 2013-07-27 DIAGNOSIS — F1994 Other psychoactive substance use, unspecified with psychoactive substance-induced mood disorder: Secondary | ICD-10-CM

## 2013-07-27 LAB — CBC WITH DIFFERENTIAL/PLATELET
Basophils Absolute: 0 10*3/uL (ref 0.0–0.1)
HCT: 34 % — ABNORMAL LOW (ref 36.0–46.0)
Lymphs Abs: 1.9 10*3/uL (ref 0.7–4.0)
MCH: 23.8 pg — ABNORMAL LOW (ref 26.0–34.0)
MCHC: 32.9 g/dL (ref 30.0–36.0)
MCV: 72.2 fL — ABNORMAL LOW (ref 78.0–100.0)
Monocytes Relative: 7 % (ref 3–12)
Neutro Abs: 24 10*3/uL — ABNORMAL HIGH (ref 1.7–7.7)
Neutrophils Relative %: 86 % — ABNORMAL HIGH (ref 43–77)
RDW: 18.4 % — ABNORMAL HIGH (ref 11.5–15.5)

## 2013-07-27 LAB — COMPREHENSIVE METABOLIC PANEL
AST: 19 U/L (ref 0–37)
CO2: 20 mEq/L (ref 19–32)
Calcium: 9.4 mg/dL (ref 8.4–10.5)
Creatinine, Ser: 1.03 mg/dL (ref 0.50–1.10)
GFR calc Af Amer: 75 mL/min — ABNORMAL LOW (ref >90)
GFR calc non Af Amer: 65 mL/min — ABNORMAL LOW (ref >90)
Potassium: 3.9 mEq/L (ref 3.5–5.1)

## 2013-07-27 LAB — LACTIC ACID, PLASMA: Lactic Acid, Venous: 1.4 mmol/L (ref 0.5–2.2)

## 2013-07-27 LAB — POCT PREGNANCY, URINE: Preg Test, Ur: NEGATIVE

## 2013-07-27 LAB — URINALYSIS, ROUTINE W REFLEX MICROSCOPIC
Bilirubin Urine: NEGATIVE
Glucose, UA: NEGATIVE mg/dL
Ketones, ur: 15 mg/dL — AB
Protein, ur: 100 mg/dL — AB

## 2013-07-27 LAB — LIPASE, BLOOD: Lipase: 11 U/L (ref 11–59)

## 2013-07-27 LAB — URINE MICROSCOPIC-ADD ON

## 2013-07-27 MED ORDER — SODIUM CHLORIDE 0.9 % IV SOLN
1000.0000 mL | Freq: Once | INTRAVENOUS | Status: AC
  Administered 2013-07-27: 1000 mL via INTRAVENOUS

## 2013-07-27 MED ORDER — ENOXAPARIN SODIUM 40 MG/0.4ML ~~LOC~~ SOLN
40.0000 mg | SUBCUTANEOUS | Status: DC
  Administered 2013-07-27 – 2013-07-29 (×3): 40 mg via SUBCUTANEOUS
  Filled 2013-07-27 (×4): qty 0.4

## 2013-07-27 MED ORDER — ONDANSETRON HCL 4 MG/2ML IJ SOLN
4.0000 mg | Freq: Once | INTRAMUSCULAR | Status: AC
  Administered 2013-07-27: 4 mg via INTRAVENOUS
  Filled 2013-07-27: qty 2

## 2013-07-27 MED ORDER — ACETAMINOPHEN 500 MG PO TABS
1000.0000 mg | ORAL_TABLET | Freq: Once | ORAL | Status: AC
  Administered 2013-07-27: 1000 mg via ORAL
  Filled 2013-07-27: qty 2

## 2013-07-27 MED ORDER — NICOTINE 21 MG/24HR TD PT24
21.0000 mg | MEDICATED_PATCH | Freq: Every day | TRANSDERMAL | Status: DC
  Administered 2013-07-27 – 2013-07-30 (×4): 21 mg via TRANSDERMAL
  Filled 2013-07-27 (×5): qty 1

## 2013-07-27 MED ORDER — PROMETHAZINE HCL 25 MG PO TABS
12.5000 mg | ORAL_TABLET | Freq: Three times a day (TID) | ORAL | Status: DC | PRN
  Administered 2013-07-28: 16:00:00 12.5 mg via ORAL
  Filled 2013-07-27: qty 1

## 2013-07-27 MED ORDER — HYDROMORPHONE HCL PF 1 MG/ML IJ SOLN
1.0000 mg | Freq: Once | INTRAMUSCULAR | Status: AC
  Administered 2013-07-27: 1 mg via INTRAVENOUS
  Filled 2013-07-27: qty 1

## 2013-07-27 MED ORDER — SODIUM CHLORIDE 0.9 % IV BOLUS (SEPSIS)
1000.0000 mL | Freq: Once | INTRAVENOUS | Status: AC
  Administered 2013-07-27: 1000 mL via INTRAVENOUS

## 2013-07-27 MED ORDER — DEXTROSE 5 % IV SOLN
1.0000 g | INTRAVENOUS | Status: DC
  Administered 2013-07-28 – 2013-07-30 (×3): 1 g via INTRAVENOUS
  Filled 2013-07-27 (×3): qty 10

## 2013-07-27 MED ORDER — CEFTRIAXONE SODIUM 2 G IJ SOLR
2.0000 g | Freq: Once | INTRAMUSCULAR | Status: AC
  Administered 2013-07-27: 2 g via INTRAVENOUS
  Filled 2013-07-27: qty 2

## 2013-07-27 MED ORDER — SODIUM CHLORIDE 0.9 % IV SOLN
1000.0000 mL | INTRAVENOUS | Status: DC
  Administered 2013-07-27 – 2013-07-30 (×4): 1000 mL via INTRAVENOUS

## 2013-07-27 MED ORDER — AMLODIPINE BESYLATE 5 MG PO TABS
5.0000 mg | ORAL_TABLET | Freq: Every day | ORAL | Status: DC
  Administered 2013-07-27 – 2013-07-29 (×3): 5 mg via ORAL
  Filled 2013-07-27 (×3): qty 1

## 2013-07-27 MED ORDER — HYDROMORPHONE HCL PF 1 MG/ML IJ SOLN
1.0000 mg | INTRAMUSCULAR | Status: DC | PRN
  Administered 2013-07-27 – 2013-07-28 (×3): 1 mg via INTRAVENOUS
  Filled 2013-07-27 (×3): qty 1

## 2013-07-27 NOTE — ED Provider Notes (Signed)
CSN: [id number]     Arrival date & time 07/27/13  7829 History   First MD Initiated Contact with Patient 07/27/13 1024     Chief Complaint  Patient presents with  . Abdominal Pain   (Consider location/radiation/quality/duration/timing/severity/associated sxs/prior Treatment) HPI  This is a 47 year old female with a past medical history of bipolar disorder and hypertension.  She presents emergency department chief complaint of abdominal pain.  Patient states that her symptoms began 4 days ago with some mild intermittent epigastric pain.  She states that her pain progressively worsened, she developed diffuse myalgias, arthralgias, chills, fever, rigors.  Patient states that she tried using neck, TheraFlu, Tylenol, and Advil at home without relief of her symptoms.  She states that yesterday she developed severe nausea, vomiting yellow liquid, yellow liquid stools. Patient states her pain became so severe today she sought help the emergency department she has a previous history of abdominal surgeries.  Patient is febrile, tachycardic and hypertensive at triage.  Past Medical History  Diagnosis Date  . Hypertension   . Bipolar 1 disorder    Past Surgical History  Procedure Laterality Date  . Eye surgery    . Vaginal wound closure / repair     Family History  Problem Relation Age of Onset  . Heart failure Father     Died of a heart attack  . Heart failure Mother     Diet heart attack   History  Substance Use Topics  . Smoking status: Current Every Day Smoker -- .5 years    Types: Cigarettes  . Smokeless tobacco: Not on file  . Alcohol Use: No   OB History   Grav Para Term Preterm Abortions TAB SAB Ect Mult Living                 Review of Systems  Constitutional: Positive for fever, chills, activity change, appetite change and fatigue.  HENT: Negative for congestion, nosebleeds and voice change.   Eyes: Negative for visual disturbance.  Respiratory: Negative for cough,  shortness of breath and wheezing.   Cardiovascular: Negative for chest pain and palpitations.  Gastrointestinal: Positive for nausea, vomiting, abdominal pain, diarrhea and abdominal distention. Negative for constipation.    Allergies  Review of patient's allergies indicates no known allergies.  Home Medications   Current Outpatient Rx  Name  Route  Sig  Dispense  Refill  . acetaminophen (TYLENOL) 325 MG tablet   Oral   Take 650 mg by mouth every 4 (four) hours as needed for mild pain, moderate pain or headache.         . Acetaminophen-Guaifenesin (THERAFLU FLU/CHEST CONGESTION) 1000-400 MG PACK   Oral   Take 1 packet by mouth 2 (two) times daily as needed (cold symptoms).         Marland Kitchen dextromethorphan-guaiFENesin (MUCINEX DM) 30-600 MG per 12 hr tablet   Oral   Take 1 tablet by mouth 2 (two) times daily as needed for cough.         Marland Kitchen ibuprofen (ADVIL,MOTRIN) 200 MG tablet   Oral   Take 800 mg by mouth every 4 (four) hours as needed for fever, headache or moderate pain.          BP 179/113  Pulse 118  Temp(Src) 101.3 F (38.5 C) (Oral)  Resp 24  Ht 5\' 6"  (1.676 m)  Wt 120 lb (54.432 kg)  BMI 19.38 kg/m2  SpO2 100% Physical Exam  Nursing note and vitals reviewed. Constitutional: She is oriented  to person, place, and time.  Thin, chronically ill-appearing female.  The patient also appears acutely ill, she has tactile fever and active rigors  HENT:  Head: Normocephalic and atraumatic.  Eyes: Conjunctivae are normal.  Neck: Normal range of motion. No JVD present.  Cardiovascular: Regular rhythm.   Tachycardic  Pulmonary/Chest: Effort normal and breath sounds normal. No respiratory distress. She has no wheezes.  Abdominal: Soft. She exhibits no distension and no mass. There is tenderness (exquisite tenderness to palpation in the epigastrium). There is no rebound.  No peritoneal sign  Musculoskeletal: Normal range of motion.  Neurological: She is alert and oriented  to person, place, and time.    ED Course  Procedures (including critical care time) Labs Review Labs Reviewed  CBC WITH DIFFERENTIAL - Abnormal; Notable for the following:    WBC 27.8 (*)    Hemoglobin 11.2 (*)    HCT 34.0 (*)    MCV 72.2 (*)    MCH 23.8 (*)    RDW 18.4 (*)    Neutrophils Relative % 86 (*)    Lymphocytes Relative 7 (*)    Neutro Abs 24.0 (*)    Monocytes Absolute 1.9 (*)    All other components within normal limits  COMPREHENSIVE METABOLIC PANEL - Abnormal; Notable for the following:    Sodium 133 (*)    Glucose, Bld 143 (*)    Total Protein 8.6 (*)    GFR calc non Af Amer 65 (*)    GFR calc Af Amer 75 (*)    All other components within normal limits  LIPASE, BLOOD  URINALYSIS, ROUTINE W REFLEX MICROSCOPIC  POCT PREGNANCY, URINE   Imaging Review No results found.  EKG Interpretation   None      CRITICAL CARE Performed by: Arthor Captain   Total critical care time: 40  Critical care time was exclusive of separately billable procedures and treating other patients.  Critical care was necessary to treat or prevent imminent or life-threatening deterioration.  Critical care was time spent personally by me on the following activities: development of treatment plan with patient and/or surrogate as well as nursing, discussions with consultants, evaluation of patient's response to treatment, examination of patient, obtaining history from patient or surrogate, ordering and performing treatments and interventions, ordering and review of laboratory studies, ordering and review of radiographic studies, pulse oximetry and re-evaluation of patient's condition.   MDM   1. Pyelonephritis, acute   2. Leukocytosis   3. Fever    Patient without elevated lactate, blood cultures and process, CBC shows a white count of 27.8 with a left shift of 24, mild hypo-meat anemia, mild height per glycemia, does have some chronic renal insufficiency.  No elevation in the  patient's creatinine.  The patient's urine appears infected with positive nitrite, too numerous to count white blood cells many bacteria.  The patient has an early urosepsis.  The patient is getting IV fluids, 2 g IV Rocephin.  4:13 PM Patient with what appears to be early urosepsis v. Acute pyelo. I have given report to Dr. Zada Girt of teaching service who will admit the patient. Patient states her abdominal pain has resolved.The patient appears reasonably stabilized for admission considering the current resources, flow, and capabilities available in the ED at this time, and I doubt any other [facility] requiring further screening and/or treatment in the ED prior to admission.   Arthor Captain, PA-C 07/27/13 1614

## 2013-07-27 NOTE — Progress Notes (Signed)
Pt BP still elevated after medication given. MD notified. No new orders at this time.

## 2013-07-27 NOTE — ED Notes (Signed)
Patient's significant other brought food for patient.

## 2013-07-27 NOTE — ED Notes (Addendum)
C/o body aches, chills, n/v/d since Monday. States she is unable to tolerate any oral intake. She took mucinex, tylenol and ibuprofen between 0530 and 0700 this am

## 2013-07-27 NOTE — H&P (Signed)
Date: 07/27/2013               Patient Name:  Leslie Golden MRN: [id number]  DOB: 1966/05/03 Age / Sex: 47 y.o., female   PCP: Kathreen Cosier, MD         Medical Service: Internal Medicine Teaching Service         Attending Physician: Dr. Burns Spain, MD    First Contact: Dr. Mariea Clonts Pager: [phone]  Second Contact: Dr. Zada Girt Pager: [phone]       After Hours (After 5p/  First Contact Pager: [phone]  weekends / holidays): Second Contact Pager: [phone]   Chief Complaint: Abdominal pain, nausea, vomiting and diarrhea  History of Present Illness: [name], is a 47 y/o Female with a PMH of pyelonephritis and HTN who presents with abdominal pain, nausea, vomiting and diarrhea of 5 days duration. Patient reports that on Sunday (11/30) she started having vomiting and diarrhea. She reports "yellow" emesis without blood and watery diarrhea 3-4 times per day that occurred after each meal. She reports decreased oral intake due to nausea, but has been able to drink some fluids including gatorade. There is assoc abd pain- epigastric,described as "twisting" , 10/10 severity, aggrav by vomiting and Non radiating. She reports that she was admitted to the hospital for a similar episode last November. She denies recent travel, sick contacts or eating meals at restaurant. Denies recent use of antibiotics.  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 She reports dizziness, lightheadedness and headache that she attributes to being dehydrated, and her elevated blood pressure, as she says she can not afford her blood pressure meds.  There is assoc weakness, and body pains. She denies chest pain, SOB, numbness/tingling in upper extremities, cough or increased sputum production.  She also reports increased urinary frequency and urgency that started a week ago (11/28). She reports having to get up multiple times during the night to  urinate-up to 3 times. She denies dysuria or hematuria,no back or flank pain, no lower abd pain.  Meds: Current Facility-Administered Medications  Medication Dose Route Frequency Provider Last Rate Last Dose  . 0.9 %  sodium chloride infusion  1,000 mL Intravenous Continuous Arthor Captain, PA-C 125 mL/hr at 07/27/13 1531 1,000 mL at 07/27/13 1531  . [START ON 07/28/2013] cefTRIAXone (ROCEPHIN) 1 g in dextrose 5 % 50 mL IVPB  1 g Intravenous Q24H Crystal Stillinger Robertson, RPH      . enoxaparin (LOVENOX) injection 40 mg  40 mg Subcutaneous Q24H Dow Adolph, MD      . HYDROmorphone (DILAUDID) injection 1 mg  1 mg Intravenous Q4H PRN Dow Adolph, MD   1 mg at 07/27/13 1919  . promethazine (PHENERGAN) tablet 12.5 mg  12.5 mg Oral Q8H PRN Dow Adolph, MD        Allergies: Allergies as of 07/27/2013  . (No Known Allergies)   Past Medical History  Diagnosis Date  . Hypertension   . Bipolar 1 disorder    Past Surgical History  Procedure Laterality Date  . Eye surgery    . Vaginal wound closure / repair     Family History  Problem Relation Age of Onset  . Heart failure Father     Died of a heart attack  . Heart failure Mother     Diet heart attack   History   Social History  .  Marital Status: Single    Spouse Name: N/A    Number of Children: N/A  . Years of Education: N/A   Occupational History  . Not on file.   Social History Main Topics  . Smoking status: Current Every Day Smoker -- .5 years    Types: Cigarettes  . Smokeless tobacco: Not on file  . Alcohol Use: No  . Drug Use: No  . Sexual Activity: Not Currently   Other Topics Concern  . 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  Physical Exam: Blood pressure 144/91, pulse 86, temperature 98.3  F (36.8 C), temperature source Oral, resp. rate 18, height 5\' 6"  (1.676 m), weight 122 lb 14.4 oz (55.747 kg), SpO2 97.00%.  GENERAL- alert, co-operative, appears as stated age, not in any distress. HEENT- Atraumatic, normocephalic, pupils not reactive, pt is wearing contacts, EOMI, oral mucosa appears moist, no cervical LN enlargement. CARDIAC- RRR, no murmurs, rubs or gallops. RESP- Moving equal volumes of air, and clear to auscultation bilaterally. ABDOMEN- Soft, tenderness in epigastric area, no guarding, no rebound, no palpable masses or organomegaly, bowel sounds present. BACK- Normal curvature of the spine, No tenderness along the vertebrae, Extreme CVA tenderness- Left side only. NEURO- No Cr abnormalities, strenght equal and present in all extremities. EXTREMITIES- pulse 2+ DP lower extrem, bounding- Radial, symmetric. SKIN- Warm, dry, No rash or lesion. PSYCH- Normal mood and affect, appropriate thought content and speech.  Lab results: Basic Metabolic Panel:  Recent Labs  16/10/96 0953  NA 133*  K 3.9  CL 97  CO2 20  GLUCOSE 143*  BUN 9  CREATININE 1.03  CALCIUM 9.4   Liver Function Tests:  Recent Labs  07/27/13 0953  AST 19  ALT 8  ALKPHOS 74  BILITOT 0.7  PROT 8.6*  ALBUMIN 3.8    Recent Labs  07/27/13 0953  LIPASE 11   No results found for this basename: AMMONIA,  in the last 72 hours CBC:  Recent Labs  07/27/13 0953  WBC 27.8*  NEUTROABS 24.0*  HGB 11.2*  HCT 34.0*  MCV 72.2*  PLT 290   Urine Drug Screen: Drugs of Abuse     Component Value Date/Time   LABOPIA POSITIVE* 07/22/2012 1328   COCAINSCRNUR NONE DETECTED 07/22/2012 1328   LABBENZ NONE DETECTED 07/22/2012 1328   AMPHETMU NONE DETECTED 07/22/2012 1328   THCU POSITIVE* 07/22/2012 1328   LABBARB NONE DETECTED 07/22/2012 1328    Urinalysis:  Recent Labs  07/27/13 1030  COLORURINE YELLOW  LABSPEC 1.020  PHURINE 6.0  GLUCOSEU NEGATIVE  HGBUR MODERATE*  BILIRUBINUR  NEGATIVE  KETONESUR 15*  PROTEINUR 100*  UROBILINOGEN 1.0  NITRITE POSITIVE*  LEUKOCYTESUR MODERATE*   Other results: EKG: Rate 87bpm, sinus rhythm, no ST or T wave changes suggestive of ischemia , axis normal,  Sinus Rhythm,  QTc= 487 ms. Compared to old EKG.   Assessment & Plan by Problem: Active Problems:   HTN (hypertension)   Pyelonephritis, acute   Sepsis due to urinary tract infection  # Pyelonephritis-  Most likely aetiology of vomiting and Fever. PT also has xtic CVA tenderness- Lt side, Tachy up to 118, with UA positive for  Nitrites, and Numerous WBC, CBC revealed a WBC- 27.8. Pt meets SIRs Criteria- 4/4. Previous Hx of pyelonephritis sam etimelast year, with CT findings of a tiny non-obstructing renal calculus without evidence of hydronephrosis.  - Commenced IV ceftriaxone 2g/day. - If no improvement on antibiotic therapy, consider CT to R/o abscess formation - Consider lower urinary tract outflow obstruction like reflux , as this is pts second episode of pyelonephritis,and previous Ct findings-07/22/2102 of mild renal scarring of the left kidney, GFR is also reduced at 64. Consider reflux nepropathy and damage due to uncontrolled HTN. - Continue Zofran IV 4 mg q8hr prn for nausea.  - Repeat EKG in the AM  - Repeat CBC and BMP in AM.  2) HTN: Patient reports history of HTN, but does not see a PCP or take medication for this- she cannot afford them. Bp elevated today-up to 179-sys, 123 diastolic, though aggrav by pain, will commnec antiHTNs.  -Start Amlodipine PO 5mg /day   3) Anemia: Microcytic anemia with Hemoglobin of 11.2 on admission. This is likely chronic as patient's baseline per chart review is 9-10. MCV reduced - 72.2, with increased RDW- 18.4 Patient is asymptomatic at this time.  - Iron studies.' - Further investigation into menstrual hx.   4) FEN:  - NS at 125 ml/hr  - Replete electrolytes as needed  - Clear liquid diet. Will advance diet as tolerated.   5)  VTE ppx: Lovenox   Dispo: Disposition is deferred at this time, awaiting improvement of current medical problems.  The patient does have a current PCP Kathreen Cosier, MD) and does need an [facility] hospital follow-up appointment after discharge.  The patient does not know have transportation limitations that hinder transportation to clinic appointments.  Signed: Kennis Carina, MD 07/27/2013, 8:10 PM

## 2013-07-27 NOTE — ED Notes (Signed)
MD at bedside. 

## 2013-07-27 NOTE — ED Provider Notes (Signed)
Medical screening examination/treatment/procedure(s) were performed by non-physician practitioner and as supervising physician I was immediately available for consultation/collaboration.  Flint Melter, MD 07/27/13 [phone]

## 2013-07-27 NOTE — Progress Notes (Signed)
Received report from ED.  

## 2013-07-27 NOTE — Progress Notes (Signed)
Leslie Golden [id number] Code Status: full   Admission Data: 07/27/2013 4:58 PM Attending Provider:  butcher WUJ:[email or web address],GNFAOZH A, MD Consults/ Treatment Team:    Leslie Golden is a 47 y.o. female patient admitted from ED awake, alert - oriented  X 3 - no acute distress noted.  VSS - Blood pressure 144/91, pulse 86, temperature 98.3 F (36.8 C), temperature source Oral, resp. rate 18, height 5\' 6"  (1.676 m), weight 55.747 kg (122 lb 14.4 oz), SpO2 97.00%.  no c/o shortness of breath, no c/o chest pain.   IV Fluids:  IV in place, occlusive dsg intact without redness, IV cath forearm left, condition patent and no redness normal saline.  Allergies:  No Known Allergies   Past Medical History  Diagnosis Date  . Hypertension   . Bipolar 1 disorder    Medications Prior to Admission  Medication Sig Dispense Refill  . acetaminophen (TYLENOL) 325 MG tablet Take 650 mg by mouth every 4 (four) hours as needed for mild pain, moderate pain or headache.      . Acetaminophen-Guaifenesin (THERAFLU FLU/CHEST CONGESTION) 1000-400 MG PACK Take 1 packet by mouth 2 (two) times daily as needed (cold symptoms).      Marland Kitchen dextromethorphan-guaiFENesin (MUCINEX DM) 30-600 MG per 12 hr tablet Take 1 tablet by mouth 2 (two) times daily as needed for cough.      Marland Kitchen ibuprofen (ADVIL,MOTRIN) 200 MG tablet Take 800 mg by mouth every 4 (four) hours as needed for fever, headache or moderate pain.       History:  obtained from the patient. Tobacco/alcohol: Smoked 1 packs per day for 23 years none  Orientation to room, and floor completed with information packet given to patient/family.  Patient declined safety video at this time.  Admission INP armband ID verified with patient/family, and in place.   SR up x 2, fall assessment complete, with patient and family able to verbalize understanding of risk associated with falls, and verbalized understanding to call nsg before up out of bed.  Call light within reach, patient able to  voice, and demonstrate understanding.  Skin, clean-dry- intact without evidence of bruising, or skin tears.   No evidence of skin break down noted on exam.     Will cont to eval and treat per MD orders.  Orvan Seen, RN 07/27/2013 4:58 PM

## 2013-07-27 NOTE — H&P (Signed)
Date: 07/27/2013               Patient Name:  Leslie Golden MRN: [id number]  DOB: 01-21-1966 Age / Sex: 47 y.o., female   PCP: Kathreen Cosier, MD              Medical Service: Internal Medicine Teaching Service              Attending Physician: Dr. Rogelia Boga    First Contact: Dr. Kennis Carina Pager: [phone]  Second Contact: Dr. Dow Adolph Pager: [phone]  Third Contact Dr. Dineen Kid: [phone]       After Hours (After 5p/  First Contact Pager: [phone]  weekends / holidays): Second Contact Pager: [phone]   Chief Complaint: Abdominal pain, nausea, vomiting and diarrhea  History of Present Illness: This is a 47 y/o Female with a history of pyelonephritis and HTN who presents with abdominal pain, nausea, vomiting and diarrhea of 5 days duration. Patient reports that on Sunday (11/30) her stomach started "feeling sick." She endorses nausea, vomiting and diarrhea since then.  She reports "yellow" emesis without blood and watery diarrhea 3-4 times per day that is associated with eating.  She reports decreased oral intake due to nausea, but has been able to drink some fluids including gatorade. She also endorses chills since Tuesday and fever of 101 degrees F yesterday. She reports dizziness, lightheadedness and headache that she attributes to being dehydrated. She also endorses epigastric abdominal pain that feels like "twisting" with maximum intensity of 10/10. Pain is worse after vomiting and does not radiate to back. She reports that she was admitted to the hospital for a similar episode last November. She denies recent travel, sick contacts or eating meals at restaurant.  Denies recent use of antibiotics. She denies chest pain, SOB, numbness/tingling in upper extremities, cough or increased sputum production.   She also reports increased urinary frequency and urgency since Friday (11/28). She reports having to get up multiple times during the night to urinate. She denies dysuria or  hematuria. She denies back pain. She denies any vaginal discharge or itching. She endorses recent sexual intercourse with her boyfriend of 2 yrs.  She explains that they do not use protection. She reports new sexual partners or known STI in her partner.   In the ED, the patient received IV Ceftriaxone (Rocephin) and received Zofran 4mg  IV.  She endorses one episode of emesis since arrival in ED. She has been able to eat some mashed potatoes and gravy without N/V.   Meds: Current Facility-Administered Medications  Medication Dose Route Frequency Provider Last Rate Last Dose  . 0.9 %  sodium chloride infusion  1,000 mL Intravenous Continuous Arthor Captain, PA-C      . [START ON 07/28/2013] cefTRIAXone (ROCEPHIN) 1 g in dextrose 5 % 50 mL IVPB  1 g Intravenous Q24H Crystal Salomon Fick, [facility]       Current Outpatient Prescriptions  Medication Sig Dispense Refill  . acetaminophen (TYLENOL) 325 MG tablet Take 650 mg by mouth every 4 (four) hours as needed for mild pain, moderate pain or headache.      . Acetaminophen-Guaifenesin (THERAFLU FLU/CHEST CONGESTION) 1000-400 MG PACK Take 1 packet by mouth 2 (two) times daily as needed (cold symptoms).      Marland Kitchen dextromethorphan-guaiFENesin (MUCINEX DM) 30-600 MG per 12 hr tablet Take 1 tablet by mouth 2 (two) times daily as needed for cough.      Marland Kitchen ibuprofen (ADVIL,MOTRIN) 200 MG tablet Take  800 mg by mouth every 4 (four) hours as needed for fever, headache or moderate pain.        Allergies: Allergies as of 07/27/2013  . (No Known Allergies)   Past Medical History  Diagnosis Date  . Hypertension   . Bipolar 1 disorder    Past Surgical History  Procedure Laterality Date  . Eye surgery    . Vaginal wound closure / repair     Family History  Problem Relation Age of Onset  . Heart failure Father     Died of a heart attack  . 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 She currently lives alone. She endorses smoking about 1 PPD of cigarettes. She also endorses Marijuana use with last use on 06/23/13. She denies other drug use. She denies alcohol use.   Review of Systems: Review of Systems  Constitutional: Positive for malaise/fatigue.  HENT: Negative for congestion and hearing loss.   Eyes: Negative for discharge and redness.       Reports black dots in vision when her blood pressure is high  Respiratory: Negative for cough, hemoptysis, sputum production and wheezing.   Cardiovascular: Negative for chest pain, palpitations and leg swelling.  Gastrointestinal:       Per HPI  Genitourinary:       Per HPI  Musculoskeletal: Negative for joint pain and myalgias.       Reports neck stiffness that does not worsen with flexion or extension   Skin: Negative for itching and rash.  Neurological: Positive for weakness and headaches.  Endo/Heme/Allergies: Does not bruise/bleed easily.    Physical Exam: Blood pressure 158/105, pulse 95, temperature 98.6 F (37 C), temperature source Axillary, resp. rate 16, height 5\' 6"  (1.676 m), weight 54.432 kg (120 lb), SpO2 100.00%. Physical Exam  Constitutional: She is oriented to person, place, and time.  Alert and cooperative. She is resting in bed and appears in mild distress with rigors  HENT:  Mouth/Throat: Oropharynx is clear and moist. No oropharyngeal exudate.  No oral lesions, ulcers or bleeding  Eyes: EOM are normal. Pupils are equal, round, and reactive to light. Right eye exhibits no discharge. Left eye exhibits no discharge. No scleral icterus.  Neck: No JVD present.  Diffusely tender to palpation but no swelling or erythema.   Cardiovascular: Regular rhythm and normal heart sounds.   3+ radial pulses. 2+ posterior tibial and dorsalis pedis pulses bilaterally.   Pulmonary/Chest: Breath sounds normal. She has no wheezes. She has no rales. She exhibits no  tenderness.  Abdominal:  Bowel sound present. Tenderness to light palpation in epigastric region with voluntary guarding. No rebound tenderness. Non-distended. The rest of the abdominal examination with limited due to patient discomfort.   Genitourinary:  Left sided CVA tenderness. The remainder of the GU exam deferred at this time.   Lymphadenopathy:    She has no cervical adenopathy.  Neurological: She is alert and oriented to person, place, and time.  Cranial nerves II-XII grossly intact  Skin: Skin is warm and dry.    Lab results: CBC    Component Value Date/Time   WBC 27.8* 07/27/2013 0953   RBC 4.71 07/27/2013 0953   HGB 11.2* 07/27/2013 0953   HCT 34.0* 07/27/2013 0953   PLT 290 07/27/2013 0953   MCV 72.2* 07/27/2013 0953   MCH 23.8*  07/27/2013 0953   MCHC 32.9 07/27/2013 0953   RDW 18.4* 07/27/2013 0953   LYMPHSABS 1.9 07/27/2013 0953   MONOABS 1.9* 07/27/2013 0953   EOSABS 0.0 07/27/2013 0953   BASOSABS 0.0 07/27/2013 0953    CMP     Component Value Date/Time   NA 133* 07/27/2013 0953   K 3.9 07/27/2013 0953   CL 97 07/27/2013 0953   CO2 20 07/27/2013 0953   GLUCOSE 143* 07/27/2013 0953   BUN 9 07/27/2013 0953   CREATININE 1.03 07/27/2013 0953   CALCIUM 9.4 07/27/2013 0953   PROT 8.6* 07/27/2013 0953   ALBUMIN 3.8 07/27/2013 0953   AST 19 07/27/2013 0953   ALT 8 07/27/2013 0953   ALKPHOS 74 07/27/2013 0953   BILITOT 0.7 07/27/2013 0953   GFRNONAA 65* 07/27/2013 0953   GFRAA 75* 07/27/2013 0953  Venous lactic acid: 1.4 mmol/L  Urinalysis    Component Value Date/Time   COLORURINE YELLOW 07/27/2013 1030   APPEARANCEUR CLOUDY* 07/27/2013 1030   LABSPEC 1.020 07/27/2013 1030   PHURINE 6.0 07/27/2013 1030   GLUCOSEU NEGATIVE 07/27/2013 1030   HGBUR MODERATE* 07/27/2013 1030   BILIRUBINUR NEGATIVE 07/27/2013 1030   KETONESUR 15* 07/27/2013 1030   PROTEINUR 100* 07/27/2013 1030   UROBILINOGEN 1.0 07/27/2013 1030   NITRITE POSITIVE* 07/27/2013 1030   LEUKOCYTESUR MODERATE* 07/27/2013 1030     Urine pregnancy test: negative   EKG: Sinus Rhythm with possible RA enlargement, nonspecific T wave changes in anteriolateral leads. QTc= 487 ms  Assessment & Plan by Problem:  This is a 47 y/o Female with a prior history of acute pyelonephritis and HTN who presents with fever, N/V and urinary urgency/frequency of 5 days duration that is likely due to pyelonephritis.   1) Pyelonephritis: Patient's increased urinary urgency/frequency, fever, nausea and vomiting are likely due to acute pyelonephritis. Also on the differential includes cystitis vs. gastroenteritis vs. Cholecystitis vs appendicitis. Urinalysis showed cloudy appearing urine that was positive for numerous WBCs and nitrites. WBC was 27.8 with left shift (ANC: 24.0). Patient is clinically stable but appears ill on exam with warm extremities, bounding radial pulses and striking left sided CVA tenderness. At presentation, patient meets criteria for sepsis based 4/4 SIRS with urinary tract as source.  She has history of pyelonephritis in November of 2013 that was treated with Levaquin. She had CT (07/22/12) which showed evidence of tiny non-obstructing renal calculus without evidence of hydronephrosis. She has not had any known complications of since then.  Will admit patient for antibiotic treatment, symptom management and further evaluation.  - Continue Ceftriaxone IV 2 mg/day. Will switch to oral antibiotics when patient improves clinically or remains afebrile for 24-48 hrs. Patient will need 14 day course of antibiotics for acute complicated pyelonephritis.  - Urine culture and blood cultures are pending. - If patient does not improve with antibiotics, will order CT abdomen to look for other causes and to r/o abcess.  - Continue Zofran IV 4 mg q8hr prn for nausea.  - Repeat EKG in the AM - Repeat CBC and BMP in AM  2) HTN: Patient reports history of HTN, but does not see a PCP or take medication for this. Patient has had several elevated  BPs since admission with range of 179-123/123-81. Most recent check was 123/94. Will continue to monitor as these elevated BPs likely secondary to pain and acute illness. Patient will need outpatient follow-up for management of chronic HTN. According to JNC 8 guidelines, in the general black population initial  antihypertensive treatment should include a thiazide-type diuretic or CCB.  -Start Amlodipine PO 5mg /day  3) Anemia: Microcytic anemia with Hemoglobin of 11.2 on admission. This is likely chronic as patient's baseline per chart review is 9-10. Patient is asymptomatic at this time. Will continue to monitor this.   4) FEN:  - NS at 125 ml/hr - Replete electrolytes as needed  - Clear liquid diet. Will advance diet as tolerated.   5) VTE ppx: Lovenox   Disposition:  Admit to medicine floor for treatment and monitoring. Expected time of discharge within 1-3 days.   This is a Psychologist, occupational Note.  The care of the patient was discussed with Dr. Mariea Clonts and the assessment and plan was formulated with their assistance.  Please see their note for official documentation of the patient encounter.   Signed: Floy Sabina, Med Student 07/27/2013, 2:53 PM

## 2013-07-28 DIAGNOSIS — D509 Iron deficiency anemia, unspecified: Secondary | ICD-10-CM

## 2013-07-28 DIAGNOSIS — A419 Sepsis, unspecified organism: Secondary | ICD-10-CM

## 2013-07-28 DIAGNOSIS — I10 Essential (primary) hypertension: Secondary | ICD-10-CM

## 2013-07-28 DIAGNOSIS — N12 Tubulo-interstitial nephritis, not specified as acute or chronic: Secondary | ICD-10-CM

## 2013-07-28 LAB — BASIC METABOLIC PANEL
BUN: 6 mg/dL (ref 6–23)
CO2: 21 mEq/L (ref 19–32)
Calcium: 7.9 mg/dL — ABNORMAL LOW (ref 8.4–10.5)
Creatinine, Ser: 0.82 mg/dL (ref 0.50–1.10)
GFR calc non Af Amer: 85 mL/min — ABNORMAL LOW (ref >90)
Glucose, Bld: 102 mg/dL — ABNORMAL HIGH (ref 70–99)

## 2013-07-28 LAB — IRON AND TIBC
Iron: <10 ug/dL — ABNORMAL LOW (ref 42–135)
UIBC: 298 ug/dL (ref 125–400)

## 2013-07-28 LAB — CBC
HCT: 25.9 % — ABNORMAL LOW (ref 36.0–46.0)
MCH: 23.3 pg — ABNORMAL LOW (ref 26.0–34.0)
MCHC: 32 g/dL (ref 30.0–36.0)
MCV: 72.8 fL — ABNORMAL LOW (ref 78.0–100.0)
Platelets: 238 10*3/uL (ref 150–400)
WBC: 21.3 10*3/uL — ABNORMAL HIGH (ref 4.0–10.5)

## 2013-07-28 LAB — FERRITIN: Ferritin: 63 ng/mL (ref 10–291)

## 2013-07-28 MED ORDER — POTASSIUM CHLORIDE CRYS ER 20 MEQ PO TBCR
40.0000 meq | EXTENDED_RELEASE_TABLET | Freq: Once | ORAL | Status: AC
  Administered 2013-07-28: 40 meq via ORAL
  Filled 2013-07-28: qty 2

## 2013-07-28 MED ORDER — ACETAMINOPHEN 325 MG PO TABS
650.0000 mg | ORAL_TABLET | Freq: Four times a day (QID) | ORAL | Status: DC | PRN
  Administered 2013-07-28: 650 mg via ORAL
  Filled 2013-07-28: qty 2

## 2013-07-28 MED ORDER — HYDROMORPHONE HCL PF 1 MG/ML IJ SOLN: 1.0000 mg | INTRAMUSCULAR | Status: DC

## 2013-07-28 MED ORDER — HYDROMORPHONE HCL PF 1 MG/ML IJ SOLN
1.0000 mg | INTRAMUSCULAR | Status: DC | PRN
  Administered 2013-07-28 – 2013-07-30 (×12): 1 mg via INTRAVENOUS
  Filled 2013-07-28 (×13): qty 1

## 2013-07-28 NOTE — H&P (Signed)
  Date: 07/28/2013  Patient name: Leslie Golden  Medical record number: [id number]  Date of birth: [birth year]   I have seen and evaluated [name] and discussed their care with the Residency Team.   Assessment and Plan: I have seen and evaluated the patient as outlined above. I agree with the formulated Assessment and Plan as detailed in the residents' admission note, with the following changes:   1. Severe sepsis 2/2 E coli Pyelo - sensitivities are still pending. Blood cx are no growth to date. This is her second episode - prior CT 11/13 showed only 4 mm non obstructing calculous. Cont empiric Rocephin and F/U sens.   2. HTN - Had been on a BB in the past. Team started norvasc. F/U BP.  3. Anemia - F/U iron studies.  Burns Spain, MD 12/5/20142:34 PM

## 2013-07-28 NOTE — Progress Notes (Signed)
Subjective:  Complains of back pain and body pains, No diarrhea since admission, vomited once since admission. Say she feels about the same, with rigors.  Objective:  Vital signs in last 24 hours:  Filed Vitals:    07/28/13 0528  07/28/13 0900  07/28/13 1353  07/28/13 1354   BP:  149/84  155/93  175/102  155/98   Pulse:  97  100  95    Temp:  99.5 F (37.5 C)  100 F (37.8 C)  99.2 F (37.3 C)    TempSrc:  Oral   Oral    Resp:  20  20  20     Height:       Weight:       SpO2:  98%   100%     Weight change:   Intake/Output Summary (Last 24 hours) at 07/28/13 1816 Last data filed at 07/28/13 1741   Gross per 24 hour   Intake  1290 ml   Output  0 ml   Net  1290 ml    GENERAL- NAD  HEENT- AT/[place] EOMI, oral mucosa appears moist,  CARDIAC- RRR, no murmurs, rubs or gallops.  RESP- Moving equal volumes of air, and clear to auscultation bilaterally.  ABDOMEN- Soft, tenderness in epigastric area, no guarding, no rebound, no palpable masses or organomegaly, bowel sounds present.  BACK- Normal curvature of the spine, No tenderness along the vertebrae, Extreme CVA tenderness- Left side only.  NEURO- No Cr abnormalities, strenght equal and present in all extremities.  EXTREMITIES- pulse 2+ DP lower extrem, bounding- Radial, symmetric.  SKIN- Warm, dry, No rash or lesion.  Medications: I have reviewed the patient's current medications.  Scheduled Meds:  .  amLODipine  5 mg  Oral  Daily   .  cefTRIAXone (ROCEPHIN) IV  1 g  Intravenous  Q24H   .  enoxaparin (LOVENOX) injection  40 mg  Subcutaneous  Q24H   .  nicotine  21 mg  Transdermal  Daily    Continuous Infusions:  .  sodium chloride  1,000 mL (07/28/13 0011)    PRN Meds:.acetaminophen, HYDROmorphone (DILAUDID) injection, promethazine  Assessment/Plan:  Principal Problem:  Pyelonephritis, acute  Active Problems:  HTN (hypertension)  Sepsis due to urinary tract infection   # Sepsis secondary to Pyelonephritis- aetiology of  pts vomiting, fever, and CVA tenderness, with tachycardia . UA- positive for Nitrites and numerous WBCs, Urine cultures- Growing Ecoli.CBC revealed a WBC- 27.8.Pt meets SIRs Criteria- 4/4. Sensitivities not available. Currently on IV ceftriaxone- 2g daily, 2nd day on antibitics. Complaint sof persistent body pains and rigors.  - Consider Abd Ct scan tomorrow if no improvement in symptoms to check for stones and renal abcess. Previous Hx of pyelonephritis sam etimelast year, with CT findings of a tiny non-obstructing renal calculus without evidence of hydronephrosis.  - Consider lower urinary tract outflow obstruction like reflux , as this is pts second episode of pyelonephritis,and previous Ct findings-07/22/2102 of mild renal scarring of the left kidney, GFR is also reduced at 64. Consider reflux nepropathy and damage due to uncontrolled HTN.  - Continue Zofran IV 4 mg q8hr prn for nausea.  2) HTN: Patient reports history of HTN, but does not see a PCP or take medication for this- she cannot afford them. Bp elevated today-up to 179-sys, 123 diastolic, though aggrav by pain, will commnec antiHTNs.  -Cont Amlodipine PO 5mg /day  3) Anemia: Microcytic anemia with Hemoglobin of 11.2 on admission. This is likely chronic as patient's baseline  per chart review is 9-10. MCV reduced - 72.2, with increased RDW- 18.4 Patient is asymptomatic at this time. Pt endorses heavy periods.  - Iron studies.'  - Iron therapy, commence on discharge.  Dorita Fray consult.  4) FEN:  - NS at 125 ml/hr  - Replete electrolytes as needed  - Clear liquid diet. Will advance diet as tolerated.  5) VTE ppx: Lovenox    Dispo: Disposition is deferred at this time, awaiting improvement of current medical problems.  The patient does have a current PCP Kathreen Cosier, MD) and does need an [facility] hospital follow-up appointment after discharge.  The patient does not know have transportation limitations that hinder transportation to clinic  appointments.    .Services Needed at time of discharge: Y = Yes, Blank = No  PT:    OT:    RN:    Equipment:    Other:    LOS: 1 day  Kennis Carina, MD  07/28/2013, 6:16 PM

## 2013-07-28 NOTE — Progress Notes (Signed)
Subjective:  Patient continues to have urinary frequency. She got up go to bathroom multiple times during the night. She denies diarrhea since admission. She endorses nausea and one episode of emesis last night. Appetite is ok. She has been drinking clear fluids. She continues to endorse fever, chills and headache. Her abdomen is still sore but pain does not radiate to the back. She denies chest pain, cough or SOB.   Objective: Vital signs in last 24 hours: Filed Vitals:   07/27/13 2031 07/27/13 2216 07/28/13 0528 07/28/13 0900  BP: 161/92 161/106 149/84 155/93  Pulse: 88  97 100  Temp: 98.3 F (36.8 C)  99.5 F (37.5 C) 100 F (37.8 C)  TempSrc: Oral  Oral   Resp: 18  20 20   Height:      Weight:      SpO2: 100%  98%    Weight change:   Intake/Output Summary (Last 24 hours) at 07/28/13 1300 Last data filed at 07/27/13 1528  Gross per 24 hour  Intake   1000 ml  Output      0 ml  Net   1000 ml   BP 155/93  Pulse 100  Temp(Src) 100 F (37.8 C) (Oral)  Resp 20  Ht 5\' 6"  (1.676 m)  Wt 55.747 kg (122 lb 14.4 oz)  BMI 19.85 kg/m2  SpO2 98%  Physical Exam:  General: Not in acute distress. Patient resting it bed with rigors.  HEENT: no conjunctival erythema, no clear icterus. Dry mucus membranes no tonsillar erythema or discharge.  CV: Regular rate and rhythm, no murmurs  Pulm: lungs clear to auscultation bilaterally, no wheezing or rhonci. No increased work of breathing noted.  Abd: tender to palpation in epigastric region. Voluntary guarding but no rebound tenderness. Bowel sounds present. Non distended.  GU: Left sided CVA tenderness Skin: warm, dry, intact. No rashes noted Extremities: no peripheral edema, 2 + radial and dorsalis pedis pulses.  Neuro: Alert and grossly oriented to person place and time.   Lab Results:  Micro Results: Recent Results (from the past 240 hour(s))  URINE CULTURE     Status: None   Collection Time    07/27/13 10:30 AM      Result Value  Range Status   Specimen Description URINE, CLEAN CATCH   Final   Special Requests NONE   Final   Culture  Setup Time     Final   Value: 07/27/2013 12:12     Performed at [facility] Count     Final   Value: >=100,000 COLONIES/ML     Performed at [facility]   Culture     Final   Value: ESCHERICHIA COLI     Performed at [facility]   Report Status PENDING   Incomplete  CULTURE, BLOOD (ROUTINE X 2)     Status: None   Collection Time    07/27/13 11:10 AM      Result Value Range Status   Specimen Description BLOOD RIGHT HAND   Final   Special Requests BOTTLES DRAWN AEROBIC AND ANAEROBIC 10CC   Final   Culture  Setup Time     Final   Value: 07/27/2013 14:32     Performed at [facility]   Culture     Final   Value:        BLOOD CULTURE RECEIVED NO GROWTH TO DATE CULTURE WILL BE HELD FOR 5 DAYS BEFORE ISSUING A FINAL NEGATIVE REPORT  Performed at [facility]   Report Status PENDING   Incomplete  CULTURE, BLOOD (ROUTINE X 2)     Status: None   Collection Time    07/27/13 11:15 AM      Result Value Range Status   Specimen Description BLOOD LEFT HAND   Final   Special Requests BOTTLES DRAWN AEROBIC ONLY 5CC   Final   Culture  Setup Time     Final   Value: 07/27/2013 14:31     Performed at [facility]   Culture     Final   Value:        BLOOD CULTURE RECEIVED NO GROWTH TO DATE CULTURE WILL BE HELD FOR 5 DAYS BEFORE ISSUING A FINAL NEGATIVE REPORT     Performed at [facility]   Report Status PENDING   Incomplete   Studies/Results: No results found. Medications: I have reviewed the patient's current medications. Scheduled Meds: . amLODipine  5 mg Oral Daily  . cefTRIAXone (ROCEPHIN)  IV  1 g Intravenous Q24H  . enoxaparin (LOVENOX) injection  40 mg Subcutaneous Q24H  . nicotine  21 mg Transdermal Daily   Continuous Infusions: . sodium chloride 1,000 mL (07/28/13 0011)   PRN Meds:.acetaminophen,  HYDROmorphone (DILAUDID) injection, promethazine  Assessment/Plan:  This is a 47 y/o Female with a prior history of acute pyelonephritis and HTN who presents with fever, N/V and urinary urgency/frequency of 5 days duration that is likely due to pyelonephritis.   1) Pyelonephritis: Increased urinary urgency/frequency, fever, nausea and vomiting are likely due to acute pyelonephritis. Unable to rule-out nephrolithiasis or abscess at this time. Patient was septic on admission based 4/4 SIRS with urinary tract as source. Urine cultures today grew E.coli. WBC: 21.3 down from 27.8 yesterday. Patient is clinically stable still endorses chills and left sided CVA tenderness.  She has history of pyelonephritis in November of 2013 that was treated with Levaquin. She had CT (07/22/12) which showed evidence of tiny non-obstructing renal calculus without evidence of hydronephrosis. She has not had any known complications of since then. Continue antibiotic treatment, symptom management and further monitoring.  - Continue Ceftriaxone IV 2 mg/day. Will switch to oral antibiotics when patient improves clinically or remains afebrile for 24-48 hrs. Patient will need 14 day course of antibiotics for acute complicated pyelonephritis.  - If patient does not improve with antibiotics, will consider U/S to look for obstruction vs CT abdomen to look for renal stones and to r/o abcess.  - Continue Phenergan PO 12.5 mg q8hr prn for nausea .  - Repeat CBC and BMP in AM  - Hydromorphone IV 1mg  q3hrs prn for pain - Tylenol PO 650 mg q6hr for fever > 101 degrees F.   2) HTN: Patient reports history of HTN, but does not see a PCP or take medication for this. She was previously taking propanolol, but stopped taking because she could not afford.  She has had several elevated BPs since admission. Amlodipine was started yesterday. BP this AM was 155/93. Will continue to monitor as these elevated BPs likely secondary to pain and acute  illness.  Patient will need outpatient follow-up for management of chronic HTN. - Continue Amlodipine PO 5mg /day   3) Anemia: Microcytic anemia with Hemoglobin of 11.2 on admission. This is likely chronic iron deficiency anemia. Hemoglobin was 8.3 this AM. This drop likely due to dilution from IVFs. No signs of bleeding or blood loss. Patient's baseline hemoglobin is 9-10.  -Anemia panel ordered today.  4) Hypokalemia: Potassium was 3.4 this AM. Will give one dose oral KCl (K-DUR) 40 mEq. Recheck in the AM.   5) FEN:  - NS at 125 ml/hr  - Replete electrolytes as needed  - Clear liquid diet. Will advance diet as tolerated.   This is a Psychologist, occupational Note.  The care of the patient was discussed with Dr. Mariea Clonts and the assessment and plan formulated with their assistance.  Please see their attached note for official documentation of the daily encounter.   LOS: 1 day   Floy Sabina, Med Student 07/28/2013, 1:00 PM

## 2013-07-28 NOTE — Progress Notes (Signed)
Utilization review completed. Maiko Salais, RN, BSN. 

## 2013-07-28 NOTE — Care Management Note (Unsigned)
    Page 1 of 1   07/28/2013     3:27:34 PM   CARE MANAGEMENT NOTE 07/28/2013  Patient:  MARIDEE, SLAPE   Account Number:  [id number]  Date Initiated:  07/28/2013  Documentation initiated by:  Letha Cape  Subjective/Objective Assessment:   dx pyelonephritis  admit- lives with boyfriend.  pta indep.     Action/Plan:   Anticipated DC Date:  07/30/2013   Anticipated DC Plan:  HOME/SELF CARE      DC Planning Services  CM consult      Choice offered to / List presented to:             Status of service:  In process, will continue to follow Medicare Important Message given?   (If response is "NO", the following Medicare IM given date fields will be blank) Date Medicare IM given:   Date Additional Medicare IM given:    Discharge Disposition:    Per UR Regulation:  Reviewed for med. necessity/level of care/duration of stay  If discussed at Long Length of Stay Meetings, dates discussed:    Comments:  07/28/13 15:26 Letha Cape RN, BSN [phone] patient lives with boyfriend,  pta indep,  NCM will continue to follow for dc needs.

## 2013-07-28 NOTE — H&P (Signed)
  Date: 07/28/2013               Patient Name:  Leslie Golden MRN: [id number]  DOB: 10/03/1965 Age / Sex: 47 y.o., female   PCP: Kathreen Cosier, MD              Medical Service: Internal Medicine Teaching Service     I have reviewed the note by Floy Sabina, MS and was present during the interview and physical exam.  Please see below for findings, assessment, and plan.  Signed: Kennis Carina, MD 07/28/2013, 5:52 AM

## 2013-07-29 LAB — URINE CULTURE

## 2013-07-29 LAB — CBC
HCT: 29.7 % — ABNORMAL LOW (ref 36.0–46.0)
Hemoglobin: 9.6 g/dL — ABNORMAL LOW (ref 12.0–15.0)
Platelets: 243 10*3/uL (ref 150–400)
RBC: 4.15 MIL/uL (ref 3.87–5.11)
RDW: 18.6 % — ABNORMAL HIGH (ref 11.5–15.5)
WBC: 13.9 10*3/uL — ABNORMAL HIGH (ref 4.0–10.5)

## 2013-07-29 LAB — BASIC METABOLIC PANEL
BUN: 4 mg/dL — ABNORMAL LOW (ref 6–23)
Calcium: 8.8 mg/dL (ref 8.4–10.5)
Chloride: 102 mEq/L (ref 96–112)
Creatinine, Ser: 0.7 mg/dL (ref 0.50–1.10)
GFR calc Af Amer: >90 mL/min (ref >90)
GFR calc non Af Amer: >90 mL/min (ref >90)
Sodium: 137 mEq/L (ref 135–145)

## 2013-07-29 LAB — RETICULOCYTES
Retic Count, Absolute: 33.8 10*3/uL (ref 19.0–186.0)
Retic Ct Pct: 0.8 % (ref 0.4–3.1)

## 2013-07-29 LAB — VITAMIN B12: Vitamin B-12: 577 pg/mL (ref 211–911)

## 2013-07-29 LAB — FERRITIN: Ferritin: 81 ng/mL (ref 10–291)

## 2013-07-29 LAB — FOLATE: Folate: 8.8 ng/mL

## 2013-07-29 LAB — MAGNESIUM: Magnesium: 2 mg/dL (ref 1.5–2.5)

## 2013-07-29 MED ORDER — AMLODIPINE BESYLATE 10 MG PO TABS
10.0000 mg | ORAL_TABLET | Freq: Every day | ORAL | Status: DC
  Administered 2013-07-30: 10 mg via ORAL
  Filled 2013-07-29: qty 1

## 2013-07-29 MED ORDER — ALPRAZOLAM 0.5 MG PO TABS
0.5000 mg | ORAL_TABLET | Freq: Every day | ORAL | Status: DC | PRN
  Administered 2013-07-29 – 2013-07-30 (×2): 0.5 mg via ORAL
  Filled 2013-07-29 (×2): qty 1

## 2013-07-29 NOTE — Progress Notes (Signed)
Subjective:  She reports improvement but she developed menses today and they usually come with severe cramps. She sometimes, she has to come to the ED for the management of her pain due to menses.  She also asking for something for anxiety. She reports to me that she goes to [facility], but she has not been in 3 months. She reports that he takes Xanax 0.5 mg daily when necessary. Objective:  Vital signs in last 24 hours:  Filed Vitals:    07/28/13 0528  07/28/13 0900  07/28/13 1353  07/28/13 1354   BP:  149/84  155/93  175/102  155/98   Pulse:  97  100  95    Temp:  99.5 F (37.5 C)  100 F (37.8 C)  99.2 F (37.3 C)    TempSrc:  Oral   Oral    Resp:  20  20  20     Height:       Weight:       SpO2:  98%   100%     Weight change:   Intake/Output Summary (Last 24 hours) at 07/28/13 1816 Last data filed at 07/28/13 1741   Gross per 24 hour   Intake  1290 ml   Output  0 ml   Net  1290 ml    GENERAL- NAD  HEENT- AT/[place] EOMI, oral mucosa appears moist,  CARDIAC- RRR, no murmurs, rubs or gallops.  RESP- Moving equal volumes of air, and clear to auscultation bilaterally.  ABDOMEN- Soft, no tenderness in epigastric area, no guarding, no rebound, no palpable masses or organomegaly, bowel sounds present.  BACK- Normal curvature of the spine, No tenderness along the vertebrae, improved lt CVA tenderness- Left side only.  NEURO- No Cr abnormalities, strenght equal and present in all extremities.  EXTREMITIES- pulse 2+ DP lower extrem, bounding- Radial, symmetric.  SKIN- Warm, dry, No rash or lesion.   Medications: I have reviewed the patient's current medications.  Scheduled Meds:  .  amLODipine  5 mg  Oral  Daily   .  cefTRIAXone (ROCEPHIN) IV  1 g  Intravenous  Q24H   .  enoxaparin (LOVENOX) injection  40 mg  Subcutaneous  Q24H   .  nicotine  21 mg  Transdermal  Daily    Continuous Infusions:  .  sodium chloride  1,000 mL (07/28/13 0011)    PRN Meds:.acetaminophen, HYDROmorphone  (DILAUDID) injection, promethazine  Assessment/Plan:  Principal Problem:  Pyelonephritis, acute  Active Problems:  HTN (hypertension)  Sepsis due to urinary tract infection   # Sepsis secondary to Pyelonephritis- etiology of pts vomiting, fever, and CVA tenderness, with tachycardia . UA- positive for Nitrites and numerous WBCs, Urine cultures- Growing E.coli which is pansensitive. Initial assessment CBC revealed a WBC- 27.8.Pt meets SIRs Criteria- 4/4. Previous Hx of pyelonephritis same time last year, with CT findings of a tiny non-obstructing renal calculus without evidence of hydronephrosis.   Plan  - Cont with IV ceftriaxone- 2g daily, 3nd day on antibitics. - will be discharged on p.o antibiotics - Continue Zofran IV 4 mg q8hr prn for nausea.  - likely discharge tomorrow  # HTN: Patient reports history of HTN, but does not see a PCP or take medication for this- she cannot afford them. Bp elevated today-up to 179-sys, 123 diastolic, though aggrav by pain, will commnec antiHTNs.  Plan  - increase Amlodipine PO 10 mg/day   # Anemia: Microcytic anemia with Hemoglobin of 11.2 on admission. This is likely chronic as  patient's baseline per chart review is 9-10. MCV reduced - 72.2, with increased RDW- 18.4 Patient is asymptomatic at this time. Pt endorses heavy periods.  - Iron studies.'  - Iron therapy, commence on discharge.  Dorita Fray consult.   # Anxiety: the patient reports history of anxiety. Apparently, has been attending [facility].  Will start her on a small dose of Xanax 0.5 mg daily when necessary.  # FEN:  - NS at 125 ml/hr  - Replete electrolytes as needed  - Clear liquid diet. Will advance diet as tolerated.   # VTE ppx: Lovenox    Dispo: Disposition is deferred at this time, awaiting improvement of current medical problems.  The patient does have a current PCP Kathreen Cosier, MD) and does need an [facility] hospital follow-up appointment after discharge.  The patient does  not know have transportation limitations that hinder transportation to clinic appointments.    .Services Needed at time of discharge: Y = Yes, Blank = No  PT:    OT:    RN:    Equipment:    Other:    LOS: 1 day  Kennis Carina, MD  07/28/2013, 6:16 PM

## 2013-07-30 DIAGNOSIS — N390 Urinary tract infection, site not specified: Secondary | ICD-10-CM

## 2013-07-30 DIAGNOSIS — F411 Generalized anxiety disorder: Secondary | ICD-10-CM

## 2013-07-30 DIAGNOSIS — N10 Acute tubulo-interstitial nephritis: Secondary | ICD-10-CM

## 2013-07-30 DIAGNOSIS — F419 Anxiety disorder, unspecified: Secondary | ICD-10-CM | POA: Diagnosis present

## 2013-07-30 MED ORDER — ALPRAZOLAM 0.5 MG PO TABS: 0.5000 mg | ORAL_TABLET | Freq: Every day | ORAL | Status: DC | PRN

## 2013-07-30 MED ORDER — OXYCODONE-ACETAMINOPHEN 5-325 MG PO TABS
1.0000 | ORAL_TABLET | ORAL | Status: DC | PRN
  Administered 2013-07-30 (×2): 1 via ORAL
  Filled 2013-07-30 (×2): qty 1

## 2013-07-30 MED ORDER — CIPROFLOXACIN HCL 500 MG PO TABS: 500.0000 mg | ORAL_TABLET | Freq: Two times a day (BID) | ORAL | Status: DC

## 2013-07-30 MED ORDER — AMLODIPINE BESYLATE 10 MG PO TABS: 10.0000 mg | ORAL_TABLET | Freq: Every day | ORAL | Status: DC

## 2013-07-30 MED ORDER — OXYCODONE-ACETAMINOPHEN 5-325 MG PO TABS: 1.0000 | ORAL_TABLET | Freq: Three times a day (TID) | ORAL | Status: DC | PRN

## 2013-07-30 NOTE — Progress Notes (Signed)
Subjective:  Patient feels better today. No new complaints. Looking forward to going home.  Objective:  Vital signs in last 24 hours:  Filed Vitals:    07/28/13 0528  07/28/13 0900  07/28/13 1353  07/28/13 1354   BP:  149/84  155/93  175/102  155/98   Pulse:  97  100  95    Temp:  99.5 F (37.5 C)  100 F (37.8 C)  99.2 F (37.3 C)    TempSrc:  Oral   Oral    Resp:  20  20  20     Height:       Weight:       SpO2:  98%   100%     Weight change:   Intake/Output Summary (Last 24 hours) at 07/28/13 1816 Last data filed at 07/28/13 1741   Gross per 24 hour   Intake  1290 ml   Output  0 ml   Net  1290 ml    GENERAL- NAD  HEENT- AT/[place] EOMI, oral mucosa appears moist,  CARDIAC- RRR, no murmurs, rubs or gallops.  RESP- Moving equal volumes of air, and clear to auscultation bilaterally.  ABDOMEN- Soft, no tenderness in epigastric area, no guarding, no rebound, no palpable masses or organomegaly, bowel sounds present.  BACK- Normal curvature of the spine, No tenderness along the vertebrae, improved lt CVA tenderness- Left side only.  NEURO- No Cr abnormalities, strenght equal and present in all extremities.  EXTREMITIES- pulse 2+ DP lower extrem, bounding- Radial, symmetric.  SKIN- Warm, dry, No rash or lesion.   Medications: I have reviewed the patient's current medications.  Scheduled Meds:  .  amLODipine  5 mg  Oral  Daily   .  cefTRIAXone (ROCEPHIN) IV  1 g  Intravenous  Q24H   .  enoxaparin (LOVENOX) injection  40 mg  Subcutaneous  Q24H   .  nicotine  21 mg  Transdermal  Daily    Continuous Infusions:  .  sodium chloride  1,000 mL (07/28/13 0011)    PRN Meds:.acetaminophen, HYDROmorphone (DILAUDID) injection, promethazine  Assessment/Plan:  Principal Problem:  Pyelonephritis, acute  Active Problems:  HTN (hypertension)  Sepsis due to urinary tract infection   # Sepsis due to UTI: Patient presented with vomiting, fever, and CVA tenderness, with tachycardia. She was  also complaining of increased urinary urgency. UA- positive for Nitrites and numerous WBCs, Urine cultures- Growing pansensitive E.coli. Initial assessment CBC revealed a WBC- 27.8. Patient met the criteria for sepsis source being UTI. Previous Hx of pyelonephritis same time in 2013, with abdominal and pelvic CT findings of a tiny non-obstructing renal calculus without evidence of hydronephrosis. Patient clinically improved after 4 days of treatment with IV rocephin.   Plan  - Cont with IV ceftriaxone- 2g daily, 4th day on antibitics. - d/c on ciprofloxacin > can refill meds from [facility] for free supply.  -  F/u as outpatient in a few days.   # HTN: Patient reports history of HTN, but does not see a PCP or take medication for this- she cannot afford them. Bp elevated today-up to 179-sys, 123 diastolic, though aggrav by pain, will commnec antiHTNs.  Plan  - discharged on Amlodipine PO 10 mg/day   # Anemia: Microcytic anemia with Hemoglobin of 11.2 on admission. This is likely chronic as patient's baseline per chart review is 9-10. MCV reduced - 72.2, with increased RDW- 18.4 Patient is asymptomatic at this time. Pt endorses heavy periods.  - Iron  studies.'  - Iron therapy, commence on discharge.  Dorita Fray consult.   # Anxiety: the patient reports history of anxiety. Apparently, has been attending [facility].  Will continue with Xanax 0.5 mg daily when necessary until review in clinic..  # FEN:  - d/c IVF 125 ml/hr   # VTE ppx: Lovenox    Dispo: Disposition is deferred at this time, awaiting improvement of current medical problems.  The patient does have a current PCP Kathreen Cosier, MD) and does need an [facility] hospital follow-up appointment after discharge.  The patient does not know have transportation limitations that hinder transportation to clinic appointments.    .Services Needed at time of discharge: Y = Yes, Blank = No  PT:    OT:    RN:    Equipment:    Other:     LOS:  1 day   Signed:  Dow Adolph, MD PGY-2 Internal Medicine Teaching Service Pager: [phone] 07/30/2013, 2:59 PM

## 2013-07-30 NOTE — Discharge Summary (Signed)
Internal Medicine Teaching [facility] Discharge Note  Name: Leslie Golden MRN: [id number] DOB: 1966/01/03 47 y.o.  Date of Admission: 07/27/2013  9:33 AM Date of Discharge: 07/30/2013 Attending Physician: Burns Spain, MD  Discharge Diagnosis: Principal Problem:   Pyelonephritis, acute Active Problems:   HTN (hypertension)   Sepsis due to urinary tract infection   Anxiety disorder   Discharge Medications:   Medication List    STOP taking these medications       acetaminophen 325 MG tablet  Commonly known as:  TYLENOL     dextromethorphan-guaiFENesin 30-600 MG per 12 hr tablet  Commonly known as:  MUCINEX DM     ibuprofen 200 MG tablet  Commonly known as:  ADVIL,MOTRIN     THERAFLU FLU/CHEST CONGESTION 1000-400 MG Pack  Generic drug:  Acetaminophen-Guaifenesin      TAKE these medications       ALPRAZolam 0.5 MG tablet  Commonly known as:  XANAX  Take 1 tablet (0.5 mg total) by mouth daily as needed for anxiety.     amLODipine 10 MG tablet  Commonly known as:  NORVASC  Take 1 tablet (10 mg total) by mouth daily.     ciprofloxacin 500 MG tablet  Commonly known as:  CIPRO  Take 1 tablet (500 mg total) by mouth 2 (two) times daily.     oxyCODONE-acetaminophen 5-325 MG per tablet  Commonly known as:  PERCOCET/ROXICET  Take 1 tablet by mouth every 8 (eight) hours as needed for moderate pain.        Disposition and follow-up:   Leslie.Lamoine T Guedes was discharged from [facility] in Good condition.  At the hospital follow up visit please address:   1. Assess compliance with ciprofloxacin. End date 08/06/2012 2. Referral to mental health  3. Evaluation for HTN and medication compliance   Follow-up Appointments:     Follow-up Information   Follow up with [facility]. Call in 1 day. (Please call our clinic for appoitment during this week. I will send a message to clinic for so that they can contact tomorrow as well  for your appointment.)    Contact information:   [address] [place] [address] [phone]     Discharge Orders   Future Orders Complete By Expires   Call MD for:  persistant nausea and vomiting  As directed    Call MD for:  severe uncontrolled pain  As directed    Call MD for:  temperature >100.4  As directed    Diet - low sodium heart healthy  As directed    Increase activity slowly  As directed       Consultations:   None.     Admission HPI:   Chief Complaint: Abdominal pain, nausea, vomiting and diarrhea   History of Present Illness: Leslie Golden, is a 47 y/o Female with a PMH of pyelonephritis and HTN who presents with abdominal pain, nausea, vomiting and diarrhea of 5 days duration. Patient reports that on Sunday (11/30) she started having vomiting and diarrhea. She reports "yellow" emesis without blood and watery diarrhea 3-4 times per day that occurred after each meal. She reports decreased oral intake due to nausea, but has been able to drink some fluids including gatorade. There is assoc abd pain- epigastric,described as "twisting" , 10/10 severity, aggrav by vomiting and Non radiating. She reports that she was admitted to the hospital for a similar episode last November. She denies recent  travel, sick contacts or eating meals at restaurant. Denies recent use of antibiotics.  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 She reports dizziness, lightheadedness and headache that she attributes to being dehydrated, and her elevated blood pressure, as she says she can not afford her blood pressure meds. There is assoc weakness, and body pains. She denies chest pain, SOB, numbness/tingling in upper extremities, cough or increased sputum production.  She also reports increased urinary frequency and urgency that started a week ago (11/28). She reports having to get up multiple times during the night to  urinate-up to 3 times. She denies dysuria or hematuria,no back or flank pain, no lower abd pain.  Physical Exam:  Blood pressure 144/91, pulse 86, temperature 98.3 F (36.8 C), temperature source Oral, resp. rate 18, height 5\' 6"  (1.676 m), weight 122 lb 14.4 oz (55.747 kg), SpO2 97.00%.  GENERAL- alert, co-operative, appears as stated age, not in any distress.  HEENT- Atraumatic, normocephalic, pupils not reactive, pt is wearing contacts, EOMI, oral mucosa appears moist, no cervical LN enlargement.  CARDIAC- RRR, no murmurs, rubs or gallops.  RESP- Moving equal volumes of air, and clear to auscultation bilaterally.  ABDOMEN- Soft, tenderness in epigastric area, no guarding, no rebound, no palpable masses or organomegaly, bowel sounds present.  BACK- Normal curvature of the spine, No tenderness along the vertebrae, Extreme CVA tenderness- Left side only.  NEURO- No Cr abnormalities, strenght equal and present in all extremities.  EXTREMITIES- pulse 2+ DP lower extrem, bounding- Radial, symmetric.  SKIN- Warm, dry, No rash or lesion.  PSYCH- Normal mood and affect, appropriate thought content and speech.    Hospital Course by problem list: Principal Problem:   Pyelonephritis, acute Active Problems:   HTN (hypertension)   Sepsis due to urinary tract infection   Anxiety disorder    # Sepsis due to UTI: Patient presented with vomiting, fever, and CVA tenderness, with tachycardia. She was also complaining of increased urinary urgency. UA- positive for Nitrites and numerous WBCs, Urine cultures- Growing pansensitive E.coli. Initial assessment CBC revealed a WBC- 27.8. Patient met the criteria for sepsis source being UTI. Previous Hx of pyelonephritis same time in 2013, with abdominal and pelvic CT findings of a tiny non-obstructing renal calculus without evidence of hydronephrosis. Patient clinically improved after 4 days of treatment with IV rocephin. She was Ciprofloxacin 500 mg bid for 7 more  days. End date 08/09/2013. Blood culture were NGTD at the time of discharge. She was advised that she can refill ciprofloxacin from [facility] for free. She will follow up as outpatient in a few days.  I will send a message to [facility] for appointment in 3-5 days.   # Hypertension: Patient reported history of HTN, but does not see a PCP or take medication for this due to cost. Blood pressure was elevated during her hospital course. She was discharged on Amlodipine PO 10 mg/day. This should be further addressed as outpatient.   # Anemia: Microcytic anemia with Hemoglobin of 11.2 on admission. This is likely chronic as patient's baseline per chart review is 9-10. MCV reduced - 72.2, with increased RDW- 18.4 Patient is asymptomatic at this time. Pt endorses heavy periods. She should be further evaluated for this as outpatient.   # Anxiety: The patient reports history of anxiety. Apparently she has been attending [employer] for sometime but stopped going three months ago. She was discharged on  Xanax 0.5 mg daily when necessary until review in clinic.   Discharge Vitals:  BP 143/93  Pulse 78  Temp(Src) 99.8 F (37.7 C) (Oral)  Resp 17  Ht 5\' 6"  (1.676 m)  Wt 122 lb 14.4 oz (55.747 kg)  BMI 19.85 kg/m2  SpO2 99%  Discharge Labs: No results found for this or any previous visit (from the past 24 hour(s)).  Signed:  Dow Adolph, MD PGY-2 Internal Medicine Teaching Service Pager: [phone] 07/30/2013, 3:11 PM     Time Spent on Discharge: 35 Services Ordered on Discharge: none Equipment Ordered on Discharge: none

## 2013-07-30 NOTE — Progress Notes (Signed)
Leslie Golden to be D/C'd Home per MD order.  Discussed with the patient and all questions fully answered.    Medication List    STOP taking these medications       acetaminophen 325 MG tablet  Commonly known as:  TYLENOL     dextromethorphan-guaiFENesin 30-600 MG per 12 hr tablet  Commonly known as:  MUCINEX DM     ibuprofen 200 MG tablet  Commonly known as:  ADVIL,MOTRIN     THERAFLU FLU/CHEST CONGESTION 1000-400 MG Pack  Generic drug:  Acetaminophen-Guaifenesin      TAKE these medications       ALPRAZolam 0.5 MG tablet  Commonly known as:  XANAX  Take 1 tablet (0.5 mg total) by mouth daily as needed for anxiety.     amLODipine 10 MG tablet  Commonly known as:  NORVASC  Take 1 tablet (10 mg total) by mouth daily.     ciprofloxacin 500 MG tablet  Commonly known as:  CIPRO  Take 1 tablet (500 mg total) by mouth 2 (two) times daily.     oxyCODONE-acetaminophen 5-325 MG per tablet  Commonly known as:  PERCOCET/ROXICET  Take 1 tablet by mouth every 8 (eight) hours as needed for moderate pain.        VVS, Skin clean, dry and intact without evidence of skin break down, no evidence of skin tears noted. IV catheter discontinued intact. Site without signs and symptoms of complications. Dressing and pressure applied.  An After Visit Summary was printed and given to the patient. Follow up appointments , new prescriptions and medication administration times given. Handout given on pyelonephritis, discussed and all questions answered.  Patient escorted via WC, and D/C home via private auto.  [name], RN 07/30/2013 2:23 PM

## 2013-08-02 LAB — CULTURE, BLOOD (ROUTINE X 2): Culture: NO GROWTH

## 2013-08-02 NOTE — Progress Notes (Signed)
  I have seen and examined the patient, and reviewed the daily progress note by Floy Sabina, MS and discussed the care of the patient with them. Please see my progress note from 08/02/2013 for further details regarding assessment and plan.    Signed:  Kennis Carina, MD 08/02/2013, 3:54 PM

## 2013-08-10 ENCOUNTER — Encounter: Payer: Self-pay | Admitting: Internal Medicine

## 2014-04-10 ENCOUNTER — Emergency Department (HOSPITAL_COMMUNITY)
Admission: EM | Admit: 2014-04-10 | Discharge: 2014-04-10 | Disposition: A | Discharge status: Home / Self Care | Payer: Self-pay | Attending: Emergency Medicine | Admitting: Emergency Medicine

## 2014-04-10 DIAGNOSIS — Z79899 Other long term (current) drug therapy: Secondary | ICD-10-CM | POA: Insufficient documentation

## 2014-04-10 DIAGNOSIS — F319 Bipolar disorder, unspecified: Secondary | ICD-10-CM | POA: Insufficient documentation

## 2014-04-10 DIAGNOSIS — F17200 Nicotine dependence, unspecified, uncomplicated: Secondary | ICD-10-CM | POA: Insufficient documentation

## 2014-04-10 DIAGNOSIS — M62838 Other muscle spasm: Secondary | ICD-10-CM | POA: Insufficient documentation

## 2014-04-10 DIAGNOSIS — I10 Essential (primary) hypertension: Secondary | ICD-10-CM | POA: Insufficient documentation

## 2014-04-10 DIAGNOSIS — R52 Pain, unspecified: Secondary | ICD-10-CM | POA: Insufficient documentation

## 2014-04-10 LAB — URINALYSIS, ROUTINE W REFLEX MICROSCOPIC
Bilirubin Urine: NEGATIVE
Glucose, UA: NEGATIVE mg/dL
HGB URINE DIPSTICK: NEGATIVE
KETONES UR: NEGATIVE mg/dL
Leukocytes, UA: NEGATIVE
Nitrite: NEGATIVE
PH: 7 (ref 5.0–8.0)
PROTEIN: NEGATIVE mg/dL
Specific Gravity, Urine: 1.023 (ref 1.005–1.030)
Urobilinogen, UA: 1 mg/dL (ref 0.0–1.0)

## 2014-04-10 MED ORDER — DIAZEPAM 5 MG PO TABS: 5.0000 mg | ORAL_TABLET | Freq: Three times a day (TID) | ORAL | Status: DC | PRN

## 2014-04-10 MED ORDER — KETOROLAC TROMETHAMINE 60 MG/2ML IM SOLN
60.0000 mg | Freq: Once | INTRAMUSCULAR | Status: AC
  Administered 2014-04-10: 60 mg via INTRAMUSCULAR
  Filled 2014-04-10: qty 2

## 2014-04-10 MED ORDER — OXYCODONE-ACETAMINOPHEN 5-325 MG PO TABS
2.0000 | ORAL_TABLET | Freq: Once | ORAL | Status: AC
  Administered 2014-04-10: 2 via ORAL
  Filled 2014-04-10: qty 2

## 2014-04-10 MED ORDER — HYDROCODONE-ACETAMINOPHEN 5-325 MG PO TABS: 1.0000 | ORAL_TABLET | ORAL | Status: DC | PRN

## 2014-04-10 MED ORDER — IBUPROFEN 600 MG PO TABS: 600.0000 mg | ORAL_TABLET | Freq: Three times a day (TID) | ORAL | Status: DC | PRN

## 2014-04-10 MED ORDER — DIAZEPAM 5 MG PO TABS
5.0000 mg | ORAL_TABLET | Freq: Once | ORAL | Status: AC
  Administered 2014-04-10: 5 mg via ORAL
  Filled 2014-04-10: qty 1

## 2014-04-10 NOTE — ED Provider Notes (Signed)
CSN: [id number]     Arrival date & time 04/10/14  0915 History   First MD Initiated Contact with Patient 04/10/14 [phone]     Chief Complaint  Patient presents with  . Generalized Body Aches     HPI Patient reports she has discomfort on the left side of her posterior scalp and left neck down into her left arm.  She denies weakness of her arms or legs.  She states this discomfort has been present for approximately 7-8 days.  She denies rash.  Denies chest pain shortness of breath.  Denies recent fall or trauma.  Denies sore throat.  No difficulty breathing or swallowing.   Past Medical History  Diagnosis Date  . Hypertension   . Bipolar 1 disorder    Past Surgical History  Procedure Laterality Date  . Eye surgery    . Vaginal wound closure / repair     Family History  Problem Relation Age of Onset  . Heart failure Father     Died of a heart attack  . Heart failure Mother     Diet heart attack   History  Substance Use Topics  . Smoking status: Current Every Day Smoker -- .5 years    Types: Cigarettes  . Smokeless tobacco: Not on file  . Alcohol Use: No   OB History   Grav Para Term Preterm Abortions TAB SAB Ect Mult Living                 Review of Systems  All other systems reviewed and are negative.     Allergies  Review of patient's allergies indicates no known allergies.  Home Medications   Prior to Admission medications   Medication Sig Start Date End Date Taking? Authorizing Provider  ALPRAZolam Prudy Feeler(XANAX) 0.5 MG tablet Take 0.5 mg by mouth daily as needed for anxiety.   Yes Historical Provider, MD  amLODipine (NORVASC) 10 MG tablet Take 10 mg by mouth daily.   Yes Historical Provider, MD   BP 160/106  Pulse 78  Temp(Src) 98.1 F (36.7 C) (Oral)  Resp 16  SpO2 100%  LMP 03/30/2014 Physical Exam  Nursing note and vitals reviewed. Constitutional: She is oriented to person, place, and time. She appears well-developed and well-nourished. No distress.   HENT:  Head: Normocephalic and atraumatic.  Eyes: EOM are normal.  Neck: Normal range of motion.  Cardiovascular: Normal rate, regular rhythm and normal heart sounds.   Pulmonary/Chest: Effort normal and breath sounds normal.  Abdominal: Soft. She exhibits no distension. There is no tenderness.  Musculoskeletal: Normal range of motion.  Left trapezius muscle spasm noted.  No midline cervical tenderness.  Normal left radial pulse.  Normal grip strength in left hand.  Full range of motion of left wrist and left elbow.  Mild pain with range of motion of left shoulder  Neurological: She is alert and oriented to person, place, and time.  Skin: Skin is warm and dry.  Psychiatric: She has a normal mood and affect. Judgment normal.    ED Course  Procedures (including critical care time) Labs Review Labs Reviewed  URINALYSIS, ROUTINE W REFLEX MICROSCOPIC    Imaging Review No results found.   EKG Interpretation None      MDM   Final diagnoses:  None    Likely musculoskeletal pain with spasm.  Anti-inflammatories, Valium, Percocet in the ER.  Home with PCP followup.    Lyanne CoKevin M Manda Holstad, MD 04/10/14 [phone]

## 2014-04-10 NOTE — ED Notes (Signed)
Pt sts that she has pain from her L side of her neck down to her L arm. Pt also has L sided abd pain with tenderness. Pt sts that this pain has been ongoing for a week and increases with touch to arm, neck, and abd. Pt sts when she coughs she also has increased pain. Pt denies any CP at this time. Pain does increase in abd when patient coughs as well.

## 2014-04-10 NOTE — ED Notes (Signed)
MD at bedside. 

## 2014-06-01 ENCOUNTER — Emergency Department (HOSPITAL_COMMUNITY)
Admission: EM | Admit: 2014-06-01 | Discharge: 2014-06-01 | Disposition: A | Discharge status: Home / Self Care | Payer: Self-pay | Attending: Emergency Medicine | Admitting: Emergency Medicine

## 2014-06-01 ENCOUNTER — Encounter (HOSPITAL_COMMUNITY): Payer: Self-pay | Admitting: Emergency Medicine

## 2014-06-01 DIAGNOSIS — Z79899 Other long term (current) drug therapy: Secondary | ICD-10-CM | POA: Insufficient documentation

## 2014-06-01 DIAGNOSIS — I10 Essential (primary) hypertension: Secondary | ICD-10-CM | POA: Insufficient documentation

## 2014-06-01 DIAGNOSIS — N390 Urinary tract infection, site not specified: Secondary | ICD-10-CM | POA: Insufficient documentation

## 2014-06-01 DIAGNOSIS — F319 Bipolar disorder, unspecified: Secondary | ICD-10-CM | POA: Insufficient documentation

## 2014-06-01 DIAGNOSIS — R1013 Epigastric pain: Secondary | ICD-10-CM

## 2014-06-01 DIAGNOSIS — R112 Nausea with vomiting, unspecified: Secondary | ICD-10-CM | POA: Insufficient documentation

## 2014-06-01 DIAGNOSIS — Z720 Tobacco use: Secondary | ICD-10-CM | POA: Insufficient documentation

## 2014-06-01 DIAGNOSIS — R51 Headache: Secondary | ICD-10-CM | POA: Insufficient documentation

## 2014-06-01 DIAGNOSIS — R197 Diarrhea, unspecified: Secondary | ICD-10-CM | POA: Insufficient documentation

## 2014-06-01 DIAGNOSIS — F1210 Cannabis abuse, uncomplicated: Secondary | ICD-10-CM | POA: Insufficient documentation

## 2014-06-01 LAB — CBC WITH DIFFERENTIAL/PLATELET
Basophils Absolute: 0 10*3/uL (ref 0.0–0.1)
Basophils Relative: 0 % (ref 0–1)
EOS ABS: 0.1 10*3/uL (ref 0.0–0.7)
EOS PCT: 1 % (ref 0–5)
HCT: 30.8 % — ABNORMAL LOW (ref 36.0–46.0)
HEMOGLOBIN: 9.4 g/dL — AB (ref 12.0–15.0)
LYMPHS PCT: 17 % (ref 12–46)
Lymphs Abs: 2.5 10*3/uL (ref 0.7–4.0)
MCH: 22.3 pg — ABNORMAL LOW (ref 26.0–34.0)
MCHC: 30.5 g/dL (ref 30.0–36.0)
MCV: 73 fL — ABNORMAL LOW (ref 78.0–100.0)
Monocytes Absolute: 0.6 10*3/uL (ref 0.1–1.0)
Monocytes Relative: 4 % (ref 3–12)
NEUTROS PCT: 78 % — AB (ref 43–77)
Neutro Abs: 11.7 10*3/uL — ABNORMAL HIGH (ref 1.7–7.7)
Platelets: 288 10*3/uL (ref 150–400)
RBC: 4.22 MIL/uL (ref 3.87–5.11)
RDW: 19 % — ABNORMAL HIGH (ref 11.5–15.5)
WBC: 14.9 10*3/uL — ABNORMAL HIGH (ref 4.0–10.5)

## 2014-06-01 LAB — COMPREHENSIVE METABOLIC PANEL
ALT: 8 U/L (ref 0–35)
AST: 18 U/L (ref 0–37)
Albumin: 4 g/dL (ref 3.5–5.2)
Alkaline Phosphatase: 67 U/L (ref 39–117)
Anion gap: 16 — ABNORMAL HIGH (ref 5–15)
BUN: 8 mg/dL (ref 6–23)
CO2: 22 meq/L (ref 19–32)
Calcium: 9.1 mg/dL (ref 8.4–10.5)
Chloride: 101 mEq/L (ref 96–112)
Creatinine, Ser: 0.97 mg/dL (ref 0.50–1.10)
GFR calc Af Amer: 80 mL/min — ABNORMAL LOW (ref >90)
GFR calc non Af Amer: 69 mL/min — ABNORMAL LOW (ref >90)
Glucose, Bld: 98 mg/dL (ref 70–99)
POTASSIUM: 3.9 meq/L (ref 3.7–5.3)
SODIUM: 139 meq/L (ref 137–147)
TOTAL PROTEIN: 8.3 g/dL (ref 6.0–8.3)
Total Bilirubin: 0.4 mg/dL (ref 0.3–1.2)

## 2014-06-01 LAB — URINALYSIS, ROUTINE W REFLEX MICROSCOPIC
Bilirubin Urine: NEGATIVE
Glucose, UA: NEGATIVE mg/dL
Ketones, ur: NEGATIVE mg/dL
NITRITE: POSITIVE — AB
PH: 6 (ref 5.0–8.0)
Protein, ur: 30 mg/dL — AB
SPECIFIC GRAVITY, URINE: 1.015 (ref 1.005–1.030)
Urobilinogen, UA: 0.2 mg/dL (ref 0.0–1.0)

## 2014-06-01 LAB — LIPASE, BLOOD: Lipase: 14 U/L (ref 11–59)

## 2014-06-01 LAB — RAPID URINE DRUG SCREEN, HOSP PERFORMED
AMPHETAMINES: NOT DETECTED
BENZODIAZEPINES: NOT DETECTED
Barbiturates: NOT DETECTED
COCAINE: NOT DETECTED
OPIATES: NOT DETECTED
Tetrahydrocannabinol: POSITIVE — AB

## 2014-06-01 LAB — ETHANOL: Alcohol, Ethyl (B): <11 mg/dL (ref 0–11)

## 2014-06-01 LAB — URINE MICROSCOPIC-ADD ON

## 2014-06-01 LAB — TROPONIN I: Troponin I: <0.3 ng/mL (ref <0.30)

## 2014-06-01 MED ORDER — SODIUM CHLORIDE 0.9 % IV SOLN
INTRAVENOUS | Status: DC
  Administered 2014-06-01: 14:00:00 via INTRAVENOUS

## 2014-06-01 MED ORDER — HYDROCODONE-ACETAMINOPHEN 5-325 MG PO TABS: 1.0000 | ORAL_TABLET | ORAL | Status: DC | PRN

## 2014-06-01 MED ORDER — SODIUM CHLORIDE 0.9 % IV BOLUS (SEPSIS)
1000.0000 mL | Freq: Once | INTRAVENOUS | Status: AC
  Administered 2014-06-01: 1000 mL via INTRAVENOUS

## 2014-06-01 MED ORDER — ONDANSETRON 8 MG PO TBDP
8.0000 mg | ORAL_TABLET | Freq: Once | ORAL | Status: AC
  Administered 2014-06-01: 8 mg via ORAL
  Filled 2014-06-01: qty 1

## 2014-06-01 MED ORDER — ONDANSETRON HCL 4 MG/2ML IJ SOLN
4.0000 mg | Freq: Once | INTRAMUSCULAR | Status: AC
  Administered 2014-06-01: 4 mg via INTRAVENOUS
  Filled 2014-06-01: qty 2

## 2014-06-01 MED ORDER — GI COCKTAIL ~~LOC~~
30.0000 mL | Freq: Once | ORAL | Status: AC
  Administered 2014-06-01: 30 mL via ORAL
  Filled 2014-06-01: qty 30

## 2014-06-01 MED ORDER — MORPHINE SULFATE 4 MG/ML IJ SOLN
4.0000 mg | Freq: Once | INTRAMUSCULAR | Status: AC
  Administered 2014-06-01: 4 mg via INTRAVENOUS
  Filled 2014-06-01: qty 1

## 2014-06-01 MED ORDER — CEPHALEXIN 500 MG PO CAPS: 500.0000 mg | ORAL_CAPSULE | Freq: Four times a day (QID) | ORAL | Status: DC

## 2014-06-01 MED ORDER — ONDANSETRON HCL 8 MG PO TABS: 8.0000 mg | ORAL_TABLET | Freq: Three times a day (TID) | ORAL | Status: DC | PRN

## 2014-06-01 MED ORDER — DEXTROSE 5 % IV SOLN
1.0000 g | Freq: Once | INTRAVENOUS | Status: AC
  Administered 2014-06-01: 1 g via INTRAVENOUS
  Filled 2014-06-01: qty 10

## 2014-06-01 NOTE — Discharge Instructions (Signed)
Abdominal Pain °Many things can cause abdominal pain. Usually, abdominal pain is not caused by a disease and will improve without treatment. It can often be observed and treated at home. Your health care provider will do a physical exam and possibly order blood tests and X-rays to help determine the seriousness of your pain. However, in many cases, more time must pass before a clear cause of the pain can be found. Before that point, your health care provider may not know if you need more testing or further treatment. °HOME CARE INSTRUCTIONS  °Monitor your abdominal pain for any changes. The following actions may help to alleviate any discomfort you are experiencing: °· Only take over-the-counter or prescription medicines as directed by your health care provider. °· Do not take laxatives unless directed to do so by your health care provider. °· Try a clear liquid diet (broth, tea, or water) as directed by your health care provider. Slowly move to a bland diet as tolerated. °SEEK MEDICAL CARE IF: °· You have unexplained abdominal pain. °· You have abdominal pain associated with nausea or diarrhea. °· You have pain when you urinate or have a bowel movement. °· You experience abdominal pain that wakes you in the night. °· You have abdominal pain that is worsened or improved by eating food. °· You have abdominal pain that is worsened with eating fatty foods. °· You have a fever. °SEEK IMMEDIATE MEDICAL CARE IF:  °· Your pain does not go away within 2 hours. °· You keep throwing up (vomiting). °· Your pain is felt only in portions of the abdomen, such as the right side or the left lower portion of the abdomen. °· You pass bloody or black tarry stools. °MAKE SURE YOU: °· Understand these instructions.   °· Will watch your condition.   °· Will get help right away if you are not doing well or get worse.   °Document Released: 05/20/2005 Document Revised: 08/15/2013 Document Reviewed: 04/19/2013 °ExitCare® Patient Information  ©2015 [employer]. This information is not intended to replace advice given to you by your health care provider. Make sure you discuss any questions you have with your health care provider. °Urinary Tract Infection °Urinary tract infections (UTIs) can develop anywhere along your urinary tract. Your urinary tract is your body's drainage system for removing wastes and extra water. Your urinary tract includes two kidneys, two ureters, a bladder, and a urethra. Your kidneys are a pair of bean-shaped organs. Each kidney is about the size of your fist. They are located below your ribs, one on each side of your spine. °CAUSES °Infections are caused by microbes, which are microscopic organisms, including fungi, viruses, and bacteria. These organisms are so small that they can only be seen through a microscope. Bacteria are the microbes that most commonly cause UTIs. °SYMPTOMS  °Symptoms of UTIs may vary by age and gender of the patient and by the location of the infection. Symptoms in young women typically include a frequent and intense urge to urinate and a painful, burning feeling in the bladder or urethra during urination. Older women and men are more likely to be tired, shaky, and weak and have muscle aches and abdominal pain. A fever may mean the infection is in your kidneys. Other symptoms of a kidney infection include pain in your back or sides below the ribs, nausea, and vomiting. °DIAGNOSIS °To diagnose a UTI, your caregiver will ask you about your symptoms. Your caregiver also will ask to provide a urine sample. The urine sample   will be tested for bacteria and white blood cells. White blood cells are made by your body to help fight infection. °TREATMENT  °Typically, UTIs can be treated with medication. Because most UTIs are caused by a bacterial infection, they usually can be treated with the use of antibiotics. The choice of antibiotic and length of treatment depend on your symptoms and the type of bacteria  causing your infection. °HOME CARE INSTRUCTIONS °· If you were prescribed antibiotics, take them exactly as your caregiver instructs you. Finish the medication even if you feel better after you have only taken some of the medication. °· Drink enough water and fluids to keep your urine clear or pale yellow. °· Avoid caffeine, tea, and carbonated beverages. They tend to irritate your bladder. °· Empty your bladder often. Avoid holding urine for long periods of time. °· Empty your bladder before and after sexual intercourse. °· After a bowel movement, women should cleanse from front to back. Use each tissue only once. °SEEK MEDICAL CARE IF:  °· You have back pain. °· You develop a fever. °· Your symptoms do not begin to resolve within 3 days. °SEEK IMMEDIATE MEDICAL CARE IF:  °· You have severe back pain or lower abdominal pain. °· You develop chills. °· You have nausea or vomiting. °· You have continued burning or discomfort with urination. °MAKE SURE YOU:  °· Understand these instructions. °· Will watch your condition. °· Will get help right away if you are not doing well or get worse. °Document Released: 05/20/2005 Document Revised: 02/09/2012 Document Reviewed: 09/18/2011 °ExitCare® Patient Information ©2015 [employer]. This information is not intended to replace advice given to you by your health care provider. Make sure you discuss any questions you have with your health care provider. ° °

## 2014-06-01 NOTE — Progress Notes (Signed)
[facility] [employer]iaison Stacy,  Provided pt with a list of primary care resources, [employer] Orange Card application, and information on [facility].

## 2014-06-01 NOTE — ED Notes (Signed)
Pt from home c/o emesis, chills and L lower abd pain x3 days. Pt denies CP, SOB. Pt sts that she was hospitalized for pyelonephritis in January and she feels that same as she did then. Pt is A&O and in NAD

## 2014-06-01 NOTE — ED Provider Notes (Signed)
CSN: [id number]     Arrival date & time 06/01/14  0820 History   First MD Initiated Contact with Patient 06/01/14 [phone]     Chief Complaint  Patient presents with  . Emesis  . Headache  . Abdominal Pain     (Consider location/radiation/quality/duration/timing/severity/associated sxs/prior Treatment) HPI  Leslie Golden is a 48 y.o. female who complains of nausea, vomiting, and diarrhea, which started yesterday. She also has a sensation of chills and fever. She has left upper quadrant abdominal pain. No known sick contacts, or suspected abnormal food ingestion. She denies cough or shortness of breath, weakness, dizziness, neck pain or difficulty walking. There are no other known modifying factors.   Past Medical History  Diagnosis Date  . Hypertension   . Bipolar 1 disorder    Past Surgical History  Procedure Laterality Date  . Eye surgery    . Vaginal wound closure / repair     Family History  Problem Relation Age of Onset  . Heart failure Father     Died of a heart attack  . Heart failure Mother     Diet heart attack   History  Substance Use Topics  . Smoking status: Current Every Day Smoker -- .5 years    Types: Cigarettes  . Smokeless tobacco: Not on file  . Alcohol Use: No   OB History   Grav Para Term Preterm Abortions TAB SAB Ect Mult Living                 Review of Systems  All other systems reviewed and are negative.     Allergies  Review of patient's allergies indicates no known allergies.  Home Medications   Prior to Admission medications   Medication Sig Start Date End Date Taking? Authorizing Provider  ARIPiprazole (ABILIFY) 5 MG tablet Take 5 mg by mouth daily.   Yes Historical Provider, MD  diphenhydrAMINE (BENADRYL) 25 mg capsule Take 50 mg by mouth every 6 (six) hours as needed for allergies.   Yes Historical Provider, MD  ibuprofen (ADVIL,MOTRIN) 200 MG tablet Take 600 mg by mouth every 6 (six) hours as needed for headache or moderate pain.    Yes Historical Provider, MD   BP 160/99  Pulse 83  Temp(Src) 98.3 F (36.8 C) (Oral)  Resp 20  SpO2 98%  LMP 04/25/2014 Physical Exam  Nursing note and vitals reviewed. Constitutional: She is oriented to person, place, and time. She appears well-developed and well-nourished.  HENT:  Head: Normocephalic and atraumatic.  Eyes: Conjunctivae and EOM are normal. Pupils are equal, round, and reactive to light.  Neck: Normal range of motion and phonation normal. Neck supple.  Cardiovascular: Normal rate and regular rhythm.   Pulmonary/Chest: Effort normal and breath sounds normal. She exhibits no tenderness.  Abdominal: Soft. She exhibits no distension and no mass. There is tenderness (Left upper quadrant, mild). There is no rebound and no guarding.  Musculoskeletal: Normal range of motion.  Neurological: She is alert and oriented to person, place, and time. She exhibits normal muscle tone.  Skin: Skin is warm and dry.  Psychiatric: She has a normal mood and affect. Her behavior is normal. Judgment and thought content normal.    ED Course  Procedures (including critical care time) Medications  0.9 %  sodium chloride infusion (not administered)  cefTRIAXone (ROCEPHIN) 1 g in dextrose 5 % 50 mL IVPB (not administered)  ondansetron (ZOFRAN-ODT) disintegrating tablet 8 mg (8 mg Oral Given 06/01/14 0858)  gi  cocktail (Maalox,Lidocaine,Donnatal) (30 mLs Oral Given 06/01/14 0938)  sodium chloride 0.9 % bolus 1,000 mL (0 mLs Intravenous Stopped 06/01/14 1302)  morphine 4 MG/ML injection 4 mg (4 mg Intravenous Given 06/01/14 1040)  ondansetron (ZOFRAN) injection 4 mg (4 mg Intravenous Given 06/01/14 1040)    Patient Vitals for the past 24 hrs:  BP Temp Temp src Pulse Resp SpO2  06/01/14 1120 - 98.3 F (36.8 C) Oral - - -  06/01/14 0900 160/99 mmHg - - 83 20 98 %  06/01/14 0846 160/107 mmHg 98.1 F (36.7 C) Oral 86 20 100 %    1:35 PM Reevaluation with update and discussion. After initial  assessment and treatment, an updated evaluation reveals she has been tolerating some oral fluids. Her nausea is beginning to come back, now. Findings discussed with patient. Will give a dose of IV antibiotics prior to discharge. All questions answered.Mancel Bale. Velena Keegan L   Labs Review Labs Reviewed  CBC WITH DIFFERENTIAL - Abnormal; Notable for the following:    WBC 14.9 (*)    Hemoglobin 9.4 (*)    HCT 30.8 (*)    MCV 73.0 (*)    MCH 22.3 (*)    RDW 19.0 (*)    Neutrophils Relative % 78 (*)    Neutro Abs 11.7 (*)    All other components within normal limits  COMPREHENSIVE METABOLIC PANEL - Abnormal; Notable for the following:    GFR calc non Af Amer 69 (*)    GFR calc Af Amer 80 (*)    Anion gap 16 (*)    All other components within normal limits  URINALYSIS, ROUTINE W REFLEX MICROSCOPIC - Abnormal; Notable for the following:    APPearance CLOUDY (*)    Hgb urine dipstick TRACE (*)    Protein, ur 30 (*)    Nitrite POSITIVE (*)    Leukocytes, UA TRACE (*)    All other components within normal limits  URINE RAPID DRUG SCREEN (HOSP PERFORMED) - Abnormal; Notable for the following:    Tetrahydrocannabinol POSITIVE (*)    All other components within normal limits  URINE MICROSCOPIC-ADD ON - Abnormal; Notable for the following:    Squamous Epithelial / LPF FEW (*)    Bacteria, UA MANY (*)    All other components within normal limits  URINE CULTURE  LIPASE, BLOOD  TROPONIN I  ETHANOL    Imaging Review No results found.   EKG Interpretation None      MDM   Final diagnoses:  UTI (lower urinary tract infection)  Epigastric pain    Nonspecific upper abdominal pain, with incidental urinary tract infection. Doubt serious bacterial infection. Metabolic instability, or impending vascular collapse.  Nursing Notes Reviewed/ Care Coordinated Applicable Imaging Reviewed Interpretation of Laboratory Data incorporated into ED treatment  The patient appears reasonably screened  and/or stabilized for discharge and I doubt any other medical condition or other [facility] requiring further screening, evaluation, or treatment in the ED at this time prior to discharge.  Plan: Home Medications- Keflex, Zofran; Home Treatments- rest, gradually advance diet; return here if the recommended treatment, does not improve the symptoms; Recommended follow up- PCP check up 1 week.     Flint MelterElliott L Karigan Cloninger, MD 06/01/14 [phone]

## 2014-06-01 NOTE — ED Notes (Signed)
Pt states she was able to keep down a cup of ice.

## 2014-06-01 NOTE — ED Notes (Signed)
Pt heeves post GI cocktail. Pt sipping slowly on ginger ale at present time.

## 2014-06-04 LAB — URINE CULTURE
Colony Count: >=100000
Special Requests: NORMAL

## 2014-06-05 ENCOUNTER — Telehealth (HOSPITAL_BASED_OUTPATIENT_CLINIC_OR_DEPARTMENT_OTHER): Payer: Self-pay | Admitting: Emergency Medicine

## 2014-06-05 NOTE — Telephone Encounter (Signed)
Post ED Visit - Positive Culture Follow-up  Culture report reviewed by antimicrobial stewardship pharmacist: []  Wes Dulaney, Pharm.D., BCPS [x]  Celedonio MiyamotoJeremy Frens, Pharm.D., BCPS []  Georgina PillionElizabeth Martin, [address] BCPS []  FeltonMinh Pham, [place]., BCPS, AAHIVP []  Estella HuskMichelle Turner, Pharm.D., BCPS, AAHIVP []  Carly Sabat, Pharm.D. []  Enzo BiNathan Batchelder, [address]  Positive urine culture Treated with cephalexin , organism sensitive to the same and no further patient follow-up is required at this time.  Berle MullMiller, Dariya Gainer 06/05/2014, 9:35 AM

## 2014-08-19 ENCOUNTER — Encounter (HOSPITAL_COMMUNITY): Payer: Self-pay | Admitting: Emergency Medicine

## 2014-08-19 ENCOUNTER — Emergency Department (HOSPITAL_COMMUNITY)
Admission: EM | Admit: 2014-08-19 | Discharge: 2014-08-19 | Disposition: A | Discharge status: Home / Self Care | Payer: Self-pay | Attending: Emergency Medicine | Admitting: Emergency Medicine

## 2014-08-19 DIAGNOSIS — H9201 Otalgia, right ear: Secondary | ICD-10-CM | POA: Insufficient documentation

## 2014-08-19 DIAGNOSIS — Z8659 Personal history of other mental and behavioral disorders: Secondary | ICD-10-CM | POA: Insufficient documentation

## 2014-08-19 DIAGNOSIS — I10 Essential (primary) hypertension: Secondary | ICD-10-CM | POA: Insufficient documentation

## 2014-08-19 DIAGNOSIS — K088 Other specified disorders of teeth and supporting structures: Secondary | ICD-10-CM | POA: Insufficient documentation

## 2014-08-19 DIAGNOSIS — K0889 Other specified disorders of teeth and supporting structures: Secondary | ICD-10-CM

## 2014-08-19 DIAGNOSIS — Z720 Tobacco use: Secondary | ICD-10-CM | POA: Insufficient documentation

## 2014-08-19 MED ORDER — OXYCODONE-ACETAMINOPHEN 5-325 MG PO TABS
2.0000 | ORAL_TABLET | Freq: Once | ORAL | Status: AC
  Administered 2014-08-19: 2 via ORAL
  Filled 2014-08-19: qty 2

## 2014-08-19 MED ORDER — OXYCODONE-ACETAMINOPHEN 5-325 MG PO TABS: 1.0000 | ORAL_TABLET | ORAL | Status: DC | PRN

## 2014-08-19 MED ORDER — PENICILLIN V POTASSIUM 500 MG PO TABS: 500.0000 mg | ORAL_TABLET | Freq: Four times a day (QID) | ORAL | Status: DC

## 2014-08-19 MED ORDER — PENICILLIN V POTASSIUM 500 MG PO TABS
500.0000 mg | ORAL_TABLET | Freq: Once | ORAL | Status: AC
  Administered 2014-08-19: 500 mg via ORAL
  Filled 2014-08-19: qty 1

## 2014-08-19 NOTE — ED Notes (Signed)
Pt arrived to the Ed with a complaint of a extremely loose tooth with abscesses above it.  Pt states that pain has been present for a week.   Pt's abscesses are located right upper.

## 2014-08-19 NOTE — Discharge Instructions (Signed)
Dental Pain °A tooth ache may be caused by cavities (tooth decay). Cavities expose the nerve of the tooth to air and hot or cold temperatures. It may come from an infection or abscess (also called a boil or furuncle) around your tooth. It is also often caused by dental caries (tooth decay). This causes the pain you are having. °DIAGNOSIS  °Your caregiver can diagnose this problem by exam. °TREATMENT  °· If caused by an infection, it may be treated with medications which kill germs (antibiotics) and pain medications as prescribed by your caregiver. Take medications as directed. °· Only take over-the-counter or prescription medicines for pain, discomfort, or fever as directed by your caregiver. °· Whether the tooth ache today is caused by infection or dental disease, you should see your dentist as soon as possible for further care. °SEEK MEDICAL CARE IF: °The exam and treatment you received today has been provided on an emergency basis only. This is not a substitute for complete medical or dental care. If your problem worsens or new problems (symptoms) appear, and you are unable to meet with your dentist, call or return to this location. °SEEK IMMEDIATE MEDICAL CARE IF:  °· You have a fever. °· You develop redness and swelling of your face, jaw, or neck. °· You are unable to open your mouth. °· You have severe pain uncontrolled by pain medicine. °MAKE SURE YOU:  °· Understand these instructions. °· Will watch your condition. °· Will get help right away if you are not doing well or get worse. °Document Released: 08/10/2005 Document Revised: 11/02/2011 Document Reviewed: 03/28/2008 °ExitCare® Patient Information ©2015 [employer]. This information is not intended to replace advice given to you by your health care provider. Make sure you discuss any questions you have with your health care provider. ° °Dental Care and Dentist Visits °Dental care supports good overall health. Regular dental visits can also help you  avoid dental pain, bleeding, infection, and other more serious health problems in the future. It is important to keep the mouth healthy because diseases in the teeth, gums, and other oral tissues can spread to other areas of the body. Some problems, such as diabetes, heart disease, and pre-term labor have been associated with poor oral health.  °See your dentist every 6 months. If you experience emergency problems such as a toothache or broken tooth, go to the dentist right away. If you see your dentist regularly, you may catch problems early. It is easier to be treated for problems in the early stages.  °WHAT TO EXPECT AT A DENTIST VISIT  °Your dentist will look for many common oral health problems and recommend proper treatment. At your regular dental visit, you can expect: °· Gentle cleaning of the teeth and gums. This includes scraping and polishing. This helps to remove the sticky substance around the teeth and gums (plaque). Plaque forms in the mouth shortly after eating. Over time, plaque hardens on the teeth as tartar. If tartar is not removed regularly, it can cause problems. Cleaning also helps remove stains. °· Periodic X-rays. These pictures of the teeth and supporting bone will help your dentist assess the health of your teeth. °· Periodic fluoride treatments. Fluoride is a natural mineral shown to help strengthen teeth. Fluoride treatment involves applying a fluoride gel or varnish to the teeth. It is most commonly done in children. °· Examination of the mouth, tongue, jaws, teeth, and gums to look for any oral health problems, such as: °¨ Cavities (dental caries). This is   decay on the tooth caused by plaque, sugar, and acid in the mouth. It is best to catch a cavity when it is small.  Inflammation of the gums caused by plaque buildup (gingivitis).  Problems with the mouth or malformed or misaligned teeth.  Oral cancer or other diseases of the soft tissues or jaws. KEEP YOUR TEETH AND GUMS  HEALTHY For healthy teeth and gums, follow these general guidelines as well as your dentist's specific advice:  Have your teeth professionally cleaned at the dentist every 6 months.  Brush twice daily with a fluoride toothpaste.  Floss your teeth daily.  Ask your dentist if you need fluoride supplements, treatments, or fluoride toothpaste.  Eat a healthy diet. Reduce foods and drinks with added sugar.  Avoid smoking. TREATMENT FOR ORAL HEALTH PROBLEMS If you have oral health problems, treatment varies depending on the conditions present in your teeth and gums.  Your caregiver will most likely recommend good oral hygiene at each visit.  For cavities, gingivitis, or other oral health disease, your caregiver will perform a procedure to treat the problem. This is typically done at a separate appointment. Sometimes your caregiver will refer you to another dental specialist for specific tooth problems or for surgery. SEEK IMMEDIATE DENTAL CARE IF:  You have pain, bleeding, or soreness in the gum, tooth, jaw, or mouth area.  A permanent tooth becomes loose or separated from the gum socket.  You experience a blow or injury to the mouth or jaw area. Document Released: 04/22/2011 Document Revised: 11/02/2011 Document Reviewed: 04/22/2011 [facility] Patient Information 2015 [employer], [place]. This information is not intended to replace advice given to you by your health care provider. Make sure you discuss any questions you have with your health care provider.  Please make an appointment with dentist Dr. Lucky CowboyKnox first thing Monday morning. Please take your discharge instructions to your appointment. Please do not drive while taking Percocet.

## 2014-08-19 NOTE — ED Provider Notes (Signed)
CSN: [id number]     Arrival date & time 08/19/14  0515 History   First MD Initiated Contact with Patient 08/19/14 [phone]     Chief Complaint  Patient presents with  . Dental Pain  . Abscess   Leslie Golden is a 48 y.o. female complaining of right upper dental pain with swelling above the past week. The patient reports a history of dental problems. Patient reports her mood is loose and she has swelling above it. And a 9 out of 10. Patient seen some relief. She reports she is attempted to get an appointment with her dentist but he cannot see her until after the new year. The patient reports some lip swelling. Patient puts her pain radiates from her tooth to her right side of her head and ear. Patient denies fevers, chills, numbness, tingling, abdominal pain, nausea, vomiting, or trouble swallowing.  (Consider location/radiation/quality/duration/timing/severity/associated sxs/prior Treatment) HPI  Past Medical History  Diagnosis Date  . Hypertension   . Bipolar 1 disorder    Past Surgical History  Procedure Laterality Date  . Eye surgery    . Vaginal wound closure / repair     Family History  Problem Relation Age of Onset  . Heart failure Father     Died of a heart attack  . Heart failure Mother     Diet heart attack   History  Substance Use Topics  . Smoking status: Current Every Day Smoker -- .5 years    Types: Cigarettes  . Smokeless tobacco: Not on file  . Alcohol Use: No   OB History    No data available     Review of Systems  Constitutional: Negative for fever and chills.  HENT: Positive for dental problem and ear pain. Negative for congestion, drooling, ear discharge, hearing loss, postnasal drip, rhinorrhea, sinus pressure, sore throat and trouble swallowing.   Eyes: Negative for pain and visual disturbance.  Respiratory: Negative for cough, shortness of breath and wheezing.   Cardiovascular: Negative for chest pain and palpitations.  Gastrointestinal: Negative for  nausea, vomiting, abdominal pain and diarrhea.  Genitourinary: Negative for dysuria, hematuria and flank pain.  Musculoskeletal: Negative for myalgias, back pain and neck pain.  Skin: Negative for rash and wound.  Neurological: Negative for dizziness, speech difficulty, light-headedness, numbness and headaches.  All other systems reviewed and are negative.     Allergies  Review of patient's allergies indicates no known allergies.  Home Medications   Prior to Admission medications   Medication Sig Start Date End Date Taking? Authorizing Provider  Ibuprofen-Diphenhydramine HCl (ADVIL PM) 200-25 MG CAPS Take 2 tablets by mouth at bedtime as needed (sleep/pain).   Yes Historical Provider, MD  cephALEXin (KEFLEX) 500 MG capsule Take 1 capsule (500 mg total) by mouth 4 (four) times daily. Patient not taking: Reported on 08/19/2014 06/01/14   Flint Melter, MD  HYDROcodone-acetaminophen (NORCO/VICODIN) 5-325 MG per tablet Take 1 tablet by mouth every 4 (four) hours as needed for moderate pain or severe pain. Patient not taking: Reported on 08/19/2014 06/01/14   Flint Melter, MD  ondansetron (ZOFRAN) 8 MG tablet Take 1 tablet (8 mg total) by mouth every 8 (eight) hours as needed for nausea or vomiting. Patient not taking: Reported on 08/19/2014 06/01/14   Flint Melter, MD  oxyCODONE-acetaminophen (PERCOCET/ROXICET) 5-325 MG per tablet Take 1-2 tablets by mouth every 4 (four) hours as needed for moderate pain or severe pain. 08/19/14   Lawana Chambers, PA-C  penicillin  v potassium (VEETID) 500 MG tablet Take 1 tablet (500 mg total) by mouth 4 (four) times daily. 08/19/14   Einar GipWilliam Duncan Mikesha Migliaccio, PA-C   BP 174/100 mmHg  Pulse 71  Temp(Src) 98.2 F (36.8 C) (Oral)  Resp 20  Ht 5\' 6"  (1.676 m)  Wt 110 lb (49.896 kg)  BMI 17.76 kg/m2  SpO2 100%  LMP 07/10/2014 (Exact Date) Physical Exam  Constitutional: She is oriented to person, place, and time. She appears well-developed and  well-nourished. No distress.  HENT:  Head: Normocephalic and atraumatic.  Right Ear: External ear normal.  Left Ear: External ear normal.  Nose: Nose normal.  Mouth/Throat: Oropharynx is clear and moist. No oropharyngeal exudate.    Patient's right upper tooth number he is loose with some swelling above it. There is no evidence of fluctuance or drainage. No abscess to drain. No tonsillar hypertrophy or exudate. Patient's uvula is midline without edema. Patient's soft palate raises symmetrically. There is no facial swelling. Bilateral tympanic membranes are pearly-gray without erythema or loss of landmarks.  Eyes: Conjunctivae and EOM are normal. Pupils are equal, round, and reactive to light. Right eye exhibits no discharge. Left eye exhibits no discharge.  Neck: Normal range of motion. Neck supple.  Cardiovascular: Normal rate, regular rhythm, normal heart sounds and intact distal pulses.  Exam reveals no gallop and no friction rub.   No murmur heard. Pulmonary/Chest: Effort normal and breath sounds normal. No respiratory distress. She has no wheezes. She has no rales.  Abdominal: Soft. She exhibits no distension. There is no tenderness.  Musculoskeletal: She exhibits no edema.  Lymphadenopathy:    She has no cervical adenopathy.  Neurological: She is alert and oriented to person, place, and time. No cranial nerve deficit. Coordination normal.  Cranial nerves II through XII are intact bilaterally. Patient is good sensation in her bilateral face.  Skin: Skin is warm and dry. No rash noted. She is not diaphoretic. No erythema. No pallor.  Psychiatric: She has a normal mood and affect. Her behavior is normal.  Nursing note and vitals reviewed.   ED Course  Procedures (including critical care time) Labs Review Labs Reviewed - No data to display  Imaging Review No results found.   EKG Interpretation None      Filed Vitals:   08/19/14 0521 08/19/14 0756  BP: 181/92 174/100  Pulse:  78 71  Temp: 98.2 F (36.8 C)   TempSrc: Oral   Resp: 16 20  Height: 5\' 6"  (1.676 m)   Weight: 110 lb (49.896 kg)   SpO2: 100% 100%     MDM   Meds given in ED:  Medications  oxyCODONE-acetaminophen (PERCOCET/ROXICET) 5-325 MG per tablet 2 tablet (2 tablets Oral Given 08/19/14 0752)  penicillin v potassium (VEETID) tablet 500 mg (500 mg Oral Given 08/19/14 0752)    Discharge Medication List as of 08/19/2014  8:15 AM    START taking these medications   Details  oxyCODONE-acetaminophen (PERCOCET/ROXICET) 5-325 MG per tablet Take 1-2 tablets by mouth every 4 (four) hours as needed for moderate pain or severe pain., Starting 08/19/2014, Until Discontinued, Print    penicillin v potassium (VEETID) 500 MG tablet Take 1 tablet (500 mg total) by mouth 4 (four) times daily., Starting 08/19/2014, Until Discontinued, Print        Final diagnoses:  Pain, dental   Patient presented to the emergency department with right upper #8 tooth pain for the past week. The patient's tooth is loose with some gingival  edema surrounding. There is no fluctuance or abscess noted. There is no drainage noted. The patient is afebrile and nontoxic-appearing. The patient's cranial nerves II through XII are intact. The patient is given Penicillin VK and Percocet in the ED. The patient was advised to call and make an appointment with the on-call dentist within 48 hours of being seen. I advised patient to take discharge instructions with her to her appointment. Advised patient to return to the emergency department with new worsening symptoms or new concerns. Patient verbalized understanding and agreement with plan.   This patient was discussed with and evaluated by Dr. Patria Maneampos who agrees with assessment and plan.     Lawana ChambersWilliam Duncan Nicolus Ose, PA-C 08/19/14 1632  Lyanne CoKevin M Campos, MD 08/19/14 1639  Lyanne CoKevin M Campos, MD 08/19/14 [phone]

## 2014-09-28 ENCOUNTER — Emergency Department (HOSPITAL_COMMUNITY)
Admission: EM | Admit: 2014-09-28 | Discharge: 2014-09-28 | Disposition: A | Discharge status: Home / Self Care | Payer: Self-pay | Attending: Emergency Medicine | Admitting: Emergency Medicine

## 2014-09-28 ENCOUNTER — Emergency Department (HOSPITAL_COMMUNITY): Payer: Self-pay

## 2014-09-28 ENCOUNTER — Encounter (HOSPITAL_COMMUNITY): Payer: Self-pay | Admitting: Emergency Medicine

## 2014-09-28 DIAGNOSIS — Z720 Tobacco use: Secondary | ICD-10-CM | POA: Insufficient documentation

## 2014-09-28 DIAGNOSIS — M21612 Bunion of left foot: Secondary | ICD-10-CM

## 2014-09-28 DIAGNOSIS — F419 Anxiety disorder, unspecified: Secondary | ICD-10-CM | POA: Insufficient documentation

## 2014-09-28 DIAGNOSIS — Y9389 Activity, other specified: Secondary | ICD-10-CM | POA: Insufficient documentation

## 2014-09-28 DIAGNOSIS — W228XXA Striking against or struck by other objects, initial encounter: Secondary | ICD-10-CM | POA: Insufficient documentation

## 2014-09-28 DIAGNOSIS — M2012 Hallux valgus (acquired), left foot: Secondary | ICD-10-CM | POA: Insufficient documentation

## 2014-09-28 DIAGNOSIS — Y92008 Other place in unspecified non-institutional (private) residence as the place of occurrence of the external cause: Secondary | ICD-10-CM | POA: Insufficient documentation

## 2014-09-28 DIAGNOSIS — I10 Essential (primary) hypertension: Secondary | ICD-10-CM | POA: Insufficient documentation

## 2014-09-28 DIAGNOSIS — Y998 Other external cause status: Secondary | ICD-10-CM | POA: Insufficient documentation

## 2014-09-28 MED ORDER — LORAZEPAM 1 MG PO TABS: 1.0000 mg | ORAL_TABLET | Freq: Four times a day (QID) | ORAL | Status: DC | PRN

## 2014-09-28 MED ORDER — HYDROCODONE-ACETAMINOPHEN 5-325 MG PO TABS: 1.0000 | ORAL_TABLET | Freq: Four times a day (QID) | ORAL | Status: DC | PRN

## 2014-09-28 MED ORDER — LORAZEPAM 1 MG PO TABS
1.0000 mg | ORAL_TABLET | Freq: Once | ORAL | Status: AC
  Administered 2014-09-28: 1 mg via ORAL
  Filled 2014-09-28: qty 2

## 2014-09-28 NOTE — ED Notes (Signed)
Pt c/o pruiritis, swelling to toe x 1 week, causing stress and agitation.

## 2014-09-28 NOTE — Discharge Instructions (Signed)
Bunion (Hallux Valgus) A bony bump (protrusion) on the inside of the foot, at the base of the first toe, is called a bunion (hallux valgus). A bunion causes the first toe to angle toward the other toes. SYMPTOMS   A bony bump on the inside of the foot, causing an outward turning of the first toe. It may also overlap the second toe.  Thickening of the skin (callus) over the bony bump.  Fluid buildup under the callus. Fluid may become red, tender, and swollen (inflamed) with constant irritation or pressure.  Foot pain and stiffness. CAUSES  Many causes exist, including:  Inherited from your family (genetics).  Injury (trauma) forcing the first toe into a position in which it overlaps other toes.  Bunions are also associated with wearing shoes that have a narrow toe box (pointy shoes). RISK INCREASES WITH:  Family history of foot abnormalities, especially bunions.  Arthritis.  Narrow shoes, especially high heels. PREVENTION  Wear shoes with a wide toe box.  Avoid shoes with high heels.  Wear a small pad between the big toe and second toe.  Maintain proper conditioning:  Foot and ankle flexibility.  Muscle strength and endurance. PROGNOSIS  With proper treatment, bunions can typically be cured. Occasionally, surgery is required.  RELATED COMPLICATIONS   Infection of the bunion.  Arthritis of the first toe.  Risks of surgery, including infection, bleeding, injury to nerves (numb toe), recurrent bunion, overcorrection (toe points inward), arthritis of the big toe, big toe pointing upward, and bone not healing. TREATMENT  Treatment first consists of stopping the activities that aggravate the pain, taking pain medicines, and icing to reduce inflammation and pain. Wear shoes with a wide toe box. Shoes can be modified by a shoe repair person to relieve pressure on the bunion, especially if you cannot find shoes with a wide enough toe box. You may also place a pad with the  center cut out in your shoe, to reduce pressure on the bunion. Sometimes, an arch support (orthotic) may reduce pressure on the bunion and alleviate the symptoms. Stretching and strengthening exercises for the muscles of the foot may be useful. You may choose to wear a brace or pad at night to hold the big toe away from the second toe. If non-surgical treatments are not successful, surgery may be needed. Surgery involves removing the overgrown tissue and correcting the position of the first toe, by realigning the bones. Bunion surgery is typically performed on an outpatient basis, meaning you can go home the same day as surgery. The surgery may involve cutting the mid portion of the bone of the first toe, or just cutting and repairing (reconstructing) the ligaments and soft tissues around the first toe.  MEDICATION   If pain medicine is needed, nonsteroidal anti-inflammatory medicines, such as aspirin and ibuprofen, or other minor pain relievers, such as acetaminophen, are often recommended.  Do not take pain medicine for 7 days before surgery.  Prescription pain relievers are usually only prescribed after surgery. Use only as directed and only as much as you need.  Ointments applied to the skin may be helpful. HEAT AND COLD  Cold treatment (icing) relieves pain and reduces inflammation. Cold treatment should be applied for 10 to 15 minutes every 2 to 3 hours for inflammation and pain and immediately after any activity that aggravates your symptoms. Use ice packs or an ice massage.  Heat treatment may be used prior to performing the stretching and strengthening activities prescribed by your  caregiver, physical therapist, or athletic trainer. Use a heat pack or a warm soak. SEEK MEDICAL CARE IF:   Symptoms get worse or do not improve in 2 weeks, despite treatment.  After surgery, you develop fever, increasing pain, redness, swelling, drainage of fluids, bleeding, or increasing warmth around the  surgical area.  New, unexplained symptoms develop. (Drugs used in treatment may produce side effects.) Document Released: 08/10/2005 Document Revised: 11/02/2011 Document Reviewed: 11/22/2008 [facility] Patient Information 2015 [place], [place]. This information is not intended to replace advice given to you by your health care provider. Make sure you discuss any questions you have with your health care provider. Generalized Anxiety Disorder Generalized anxiety disorder (GAD) is a mental disorder. It interferes with life functions, including relationships, work, and school. GAD is different from normal anxiety, which everyone experiences at some point in their lives in response to specific life events and activities. Normal anxiety actually helps [place] prepare for and get through these life events and activities. Normal anxiety goes away after the event or activity is over.  GAD causes anxiety that is not necessarily related to specific events or activities. It also causes excess anxiety in proportion to specific events or activities. The anxiety associated with GAD is also difficult to control. GAD can vary from mild to severe. People with severe GAD can have intense waves of anxiety with physical symptoms (panic attacks).  SYMPTOMS The anxiety and worry associated with GAD are difficult to control. This anxiety and worry are related to many life events and activities and also occur more days than not for 6 months or longer. People with GAD also have three or more of the following symptoms (one or more in children):  Restlessness.   Fatigue.  Difficulty concentrating.   Irritability.  Muscle tension.  Difficulty sleeping or unsatisfying sleep. DIAGNOSIS GAD is diagnosed through an assessment by your health care provider. Your health care provider will ask you questions aboutyour mood,physical symptoms, and events in your life. Your health care provider may ask you about your medical history  and use of alcohol or drugs, including prescription medicines. Your health care provider may also do a physical exam and blood tests. Certain medical conditions and the use of certain substances can cause symptoms similar to those associated with GAD. Your health care provider may refer you to a mental health specialist for further evaluation. TREATMENT The following therapies are usually used to treat GAD:   Medication. Antidepressant medication usually is prescribed for long-term daily control. Antianxiety medicines may be added in severe cases, especially when panic attacks occur.   Talk therapy (psychotherapy). Certain types of talk therapy can be helpful in treating GAD by providing support, education, and guidance. A form of talk therapy called cognitive behavioral therapy can teach you healthy ways to think about and react to daily life events and activities.  Stress managementtechniques. These include yoga, meditation, and exercise and can be very helpful when they are practiced regularly. A mental health specialist can help determine which treatment is best for you. Some people see improvement with one therapy. However, other people require a combination of therapies. Document Released: 12/05/2012 Document Revised: 12/25/2013 Document Reviewed: 12/05/2012 [facility] Patient Information 2015 [place], [place]. This information is not intended to replace advice given to you by your health care provider. Make sure you discuss any questions you have with your health care provider.

## 2014-09-28 NOTE — ED Provider Notes (Signed)
CSN: [id number]     Arrival date & time 09/28/14  1450 History  This chart was scribed for non-physician practitioner Ivar Drape, PA-C working with Doug Sou, MD by Murriel Hopper, ED Scribe. This patient was seen in room WTR9/WTR9 and the patient's care was started at 3:11 PM.     Chief Complaint  Patient presents with  . Toe Pain  . multiple complaints     The history is provided by the patient. No language interpreter was used.     HPI Comments: Leslie Golden is a 49 y.o. female who presents to the Emergency Department complaining of constant, worsening left great toe pain that has been present for a week after she hit her toe on a piece of furniture while doing things around her house. Pt also complains of generalized skin tingling and irritation, and states that it feels like there are "little mites crawling out of my skin". Pt notes feeling stressed and agitated at work recently as well, which she believes may be part of the problem.    Past Medical History  Diagnosis Date  . Hypertension   . Bipolar 1 disorder    Past Surgical History  Procedure Laterality Date  . Eye surgery    . Vaginal wound closure / repair     Family History  Problem Relation Age of Onset  . Heart failure Father     Died of a heart attack  . Heart failure Mother     Diet heart attack   History  Substance Use Topics  . Smoking status: Current Every Day Smoker -- .5 years    Types: Cigarettes  . Smokeless tobacco: Not on file  . Alcohol Use: No   OB History    No data available     Review of Systems  Musculoskeletal: Positive for joint swelling and arthralgias.  Psychiatric/Behavioral: Positive for agitation.      Allergies  Review of patient's allergies indicates no known allergies.  Home Medications   Prior to Admission medications   Medication Sig Start Date End Date Taking? Authorizing Provider  cephALEXin (KEFLEX) 500 MG capsule Take 1 capsule (500 mg total) by mouth 4  (four) times daily. Patient not taking: Reported on 08/19/2014 06/01/14   Flint Melter, MD  HYDROcodone-acetaminophen (NORCO/VICODIN) 5-325 MG per tablet Take 1 tablet by mouth every 4 (four) hours as needed for moderate pain or severe pain. Patient not taking: Reported on 08/19/2014 06/01/14   Flint Melter, MD  Ibuprofen-Diphenhydramine HCl (ADVIL PM) 200-25 MG CAPS Take 2 tablets by mouth at bedtime as needed (sleep/pain).    Historical Provider, MD  ondansetron (ZOFRAN) 8 MG tablet Take 1 tablet (8 mg total) by mouth every 8 (eight) hours as needed for nausea or vomiting. Patient not taking: Reported on 08/19/2014 06/01/14   Flint Melter, MD  oxyCODONE-acetaminophen (PERCOCET/ROXICET) 5-325 MG per tablet Take 1-2 tablets by mouth every 4 (four) hours as needed for moderate pain or severe pain. 08/19/14   Einar Gip Dansie, PA-C  penicillin v potassium (VEETID) 500 MG tablet Take 1 tablet (500 mg total) by mouth 4 (four) times daily. 08/19/14   Einar Gip Dansie, PA-C   BP 191/106 mmHg  Pulse 85  Temp(Src) 98.4 F (36.9 C) (Oral)  Resp 18  SpO2 100% Physical Exam  Constitutional: She is oriented to person, place, and time. She appears well-developed and well-nourished.  HENT:  Head: Normocephalic and atraumatic.  Eyes: Conjunctivae and EOM are normal. Pupils  are equal, round, and reactive to light.  Neck: Normal range of motion. Neck supple.  Cardiovascular: Normal rate and regular rhythm.  Exam reveals no gallop and no friction rub.   No murmur heard. Pulmonary/Chest: Effort normal and breath sounds normal. No respiratory distress. She has no wheezes. She has no rales. She exhibits no tenderness.  Abdominal: Soft. Bowel sounds are normal. She exhibits no distension and no mass. There is no tenderness. There is no rebound and no guarding.  Musculoskeletal: Normal range of motion. She exhibits no edema or tenderness.  Left great toe remarkable for bunion, no sign of  infection, no erythema  TTP over left great toe  Neurological: She is alert and oriented to person, place, and time.  Skin: Skin is warm and dry.  Psychiatric: She has a normal mood and affect. Her behavior is normal. Judgment and thought content normal.  Nursing note and vitals reviewed.   ED Course  Procedures (including critical care time)  DIAGNOSTIC STUDIES: Oxygen Saturation is 100% on RA, normal by my interpretation.    COORDINATION OF CARE: 3:13 PM Discussed treatment plan with pt at bedside and pt agreed to plan.   Labs Review Labs Reviewed - No data to display  Imaging Review No results found.   EKG Interpretation None      MDM   Final diagnoses:  Bunion of great toe of left foot  Anxiety    Patient with bunion of left great toe, and anxiety. Significant relief with Ativan. Will treat the pain with some pain medicine and a postop shoe. Recommend orthopedic follow-up for bunion, and primary care follow-up for anxiety. Patient understands and agrees with plan. She is stable and ready for discharge.  I personally performed the services described in this documentation, which was scribed in my presence. The recorded information has been reviewed and is accurate.    Roxy Horsemanobert Brieana Shimmin, PA-C 09/28/14 1650  Flint MelterElliott L Wentz, MD 09/29/14 [id number]

## 2014-10-08 ENCOUNTER — Encounter (HOSPITAL_COMMUNITY): Payer: Self-pay | Admitting: Emergency Medicine

## 2014-10-08 ENCOUNTER — Emergency Department (HOSPITAL_COMMUNITY)
Admission: EM | Admit: 2014-10-08 | Discharge: 2014-10-08 | Disposition: A | Discharge status: Home / Self Care | Payer: Self-pay | Attending: Emergency Medicine | Admitting: Emergency Medicine

## 2014-10-08 DIAGNOSIS — I10 Essential (primary) hypertension: Secondary | ICD-10-CM | POA: Insufficient documentation

## 2014-10-08 DIAGNOSIS — Z792 Long term (current) use of antibiotics: Secondary | ICD-10-CM | POA: Insufficient documentation

## 2014-10-08 DIAGNOSIS — M25511 Pain in right shoulder: Secondary | ICD-10-CM | POA: Insufficient documentation

## 2014-10-08 DIAGNOSIS — D649 Anemia, unspecified: Secondary | ICD-10-CM

## 2014-10-08 DIAGNOSIS — Z720 Tobacco use: Secondary | ICD-10-CM | POA: Insufficient documentation

## 2014-10-08 DIAGNOSIS — R002 Palpitations: Secondary | ICD-10-CM

## 2014-10-08 DIAGNOSIS — F419 Anxiety disorder, unspecified: Secondary | ICD-10-CM

## 2014-10-08 LAB — I-STAT CHEM 8, ED
BUN: 12 mg/dL (ref 6–23)
CALCIUM ION: 1.06 mmol/L — AB (ref 1.12–1.23)
CHLORIDE: 107 mmol/L (ref 96–112)
CREATININE: 1 mg/dL (ref 0.50–1.10)
Glucose, Bld: 91 mg/dL (ref 70–99)
HEMATOCRIT: 29 % — AB (ref 36.0–46.0)
HEMOGLOBIN: 9.9 g/dL — AB (ref 12.0–15.0)
Potassium: 3.5 mmol/L (ref 3.5–5.1)
SODIUM: 144 mmol/L (ref 135–145)
TCO2: 21 mmol/L (ref 0–100)

## 2014-10-08 LAB — I-STAT TROPONIN, ED: Troponin i, poc: 0.02 ng/mL (ref 0.00–0.08)

## 2014-10-08 LAB — RAPID URINE DRUG SCREEN, HOSP PERFORMED
AMPHETAMINES: NOT DETECTED
Barbiturates: NOT DETECTED
Benzodiazepines: NOT DETECTED
Cocaine: NOT DETECTED
OPIATES: NOT DETECTED
TETRAHYDROCANNABINOL: POSITIVE — AB

## 2014-10-08 LAB — PREGNANCY, URINE: Preg Test, Ur: NEGATIVE

## 2014-10-08 MED ORDER — LORAZEPAM 1 MG PO TABS
0.5000 mg | ORAL_TABLET | Freq: Once | ORAL | Status: AC
  Administered 2014-10-08: 0.5 mg via ORAL
  Filled 2014-10-08: qty 1

## 2014-10-08 NOTE — ED Provider Notes (Signed)
CSN: [id number]     Arrival date & time 10/08/14  1104 History   First MD Initiated Contact with Patient 10/08/14 1123     Chief Complaint  Patient presents with  . Tachycardia     (Consider location/radiation/quality/duration/timing/severity/associated sxs/prior Treatment) HPI Comments: 89 rolled female with substance induced mood disorder, marijuana dependence, high blood pressure, tobacco abuse presents after palpitations and elevated heart rate. Patient felt well this morning and that at work she started having right shoulder pain and palpitations. EMS found heart rate in the 170s and per report SVT. Patient went into normal sinus rhythm once IV placed without medical treatment. Patient has had anxiety issues in the past with mild similar symptoms. Patient currently has no symptoms except mild anxiety that similar to previous. Patient denies chest pain shortness of breath blood clot history, recent surgeries, leg swelling, bleeding. Patient denies any arrhythmia history.  The history is provided by the patient.    Past Medical History  Diagnosis Date  . Hypertension   . Bipolar 1 disorder    Past Surgical History  Procedure Laterality Date  . Eye surgery    . Vaginal wound closure / repair     Family History  Problem Relation Age of Onset  . Heart failure Father     Died of a heart attack  . Heart failure Mother     Diet heart attack   History  Substance Use Topics  . Smoking status: Current Every Day Smoker -- .5 years    Types: Cigarettes  . Smokeless tobacco: Not on file  . Alcohol Use: No   OB History    No data available     Review of Systems  Constitutional: Negative for fever and chills.  HENT: Negative for congestion.   Eyes: Negative for visual disturbance.  Respiratory: Negative for shortness of breath.   Cardiovascular: Positive for palpitations. Negative for chest pain.  Gastrointestinal: Negative for vomiting and abdominal pain.  Genitourinary:  Negative for dysuria and flank pain.  Musculoskeletal: Negative for back pain, neck pain and neck stiffness.  Skin: Negative for rash.  Neurological: Negative for light-headedness and headaches.  Psychiatric/Behavioral: The patient is nervous/anxious.       Allergies  Review of patient's allergies indicates no known allergies.  Home Medications   Prior to Admission medications   Medication Sig Start Date End Date Taking? Authorizing Provider  cephALEXin (KEFLEX) 500 MG capsule Take 1 capsule (500 mg total) by mouth 4 (four) times daily. Patient not taking: Reported on 08/19/2014 06/01/14   Flint MelterElliott L Wentz, MD  HYDROcodone-acetaminophen (NORCO/VICODIN) 5-325 MG per tablet Take 1-2 tablets by mouth every 6 (six) hours as needed for moderate pain or severe pain. 09/28/14   Roxy Horsemanobert Browning, PA-C  Ibuprofen-Diphenhydramine HCl (ADVIL PM) 200-25 MG CAPS Take 2 tablets by mouth at bedtime as needed (sleep/pain).    Historical Provider, MD  LORazepam (ATIVAN) 1 MG tablet Take 1 tablet (1 mg total) by mouth every 6 (six) hours as needed for anxiety. 09/28/14   Roxy Horsemanobert Browning, PA-C  ondansetron (ZOFRAN) 8 MG tablet Take 1 tablet (8 mg total) by mouth every 8 (eight) hours as needed for nausea or vomiting. Patient not taking: Reported on 08/19/2014 06/01/14   Flint MelterElliott L Wentz, MD  oxyCODONE-acetaminophen (PERCOCET/ROXICET) 5-325 MG per tablet Take 1-2 tablets by mouth every 4 (four) hours as needed for moderate pain or severe pain. 08/19/14   Einar GipWilliam Duncan Dansie, PA-C  penicillin v potassium (VEETID) 500 MG tablet Take  1 tablet (500 mg total) by mouth 4 (four) times daily. 08/19/14   Einar Gip Dansie, PA-C   BP 152/93 mmHg  Pulse 86  Temp(Src) 98.6 F (37 C) (Oral)  Resp 13  SpO2 100%  LMP 09/28/2014 Physical Exam  Constitutional: She is oriented to person, place, and time. She appears well-developed and well-nourished.  HENT:  Head: Normocephalic and atraumatic.  Eyes: Conjunctivae are  normal. Right eye exhibits no discharge. Left eye exhibits no discharge.  Neck: Normal range of motion. Neck supple. No tracheal deviation present.  Cardiovascular: Normal rate and regular rhythm.   No murmur heard. Pulmonary/Chest: Effort normal and breath sounds normal.  Abdominal: Soft. She exhibits no distension. There is no tenderness. There is no guarding.  Musculoskeletal: She exhibits no edema.  Neurological: She is alert and oriented to person, place, and time.  Skin: Skin is warm. No rash noted.  Psychiatric: She has a normal mood and affect.  Nursing note and vitals reviewed.   ED Course  Procedures (including critical care time) Labs Review Labs Reviewed  I-STAT CHEM 8, ED - Abnormal; Notable for the following:    Calcium, Ion 1.06 (*)    Hemoglobin 9.9 (*)    HCT 29.0 (*)    All other components within normal limits  URINE RAPID DRUG SCREEN (HOSP PERFORMED)  PREGNANCY, URINE  I-STAT TROPOININ, ED    Imaging Review No results found.   EKG Interpretation   Date/Time:  Monday October 08 2014 11:09:56 EST Ventricular Rate:  92 PR Interval:  156 QRS Duration: 83 QT Interval:  395 QTC Calculation: 489 R Axis:   78 Text Interpretation:  Sinus rhythm Probable left atrial enlargement  Probable left ventricular hypertrophy Nonspecific T abnrm, anterolateral  leads Borderline prolonged QT interval Confirmed by Raeven Pint  MD, Quanta Roher  ([id number]) on 10/08/2014 11:25:53 AM      MDM   Final diagnoses:  Palpitations  Anxiety  Anemia, unspecified anemia type   Patient presents after brief episode of palpitations and elevated heart rate. Multiple attempts to obtain rhythm strip from EMS unsuccessful per report SVT. Patient currently has no significant symptoms. Electrolytes unremarkable, hemoglobin showed anemia similar to previous. With atypical right shoulder discomfort transient i-STAT troponin pending. Patient need outpatient follow-up with primary Dr./cardiology for  possible rhythm monitoring/Holter. Reasons to return given.  Results and differential diagnosis were discussed with the patient/parent/guardian.  Patient had gradually worsening anxiety symptoms in the ER no suicidal or homicidal ideation. Patient requesting to see behavioral health for further assessment, TTS consult at help arrange close outpatient follow-up and any medicine recommendations. Patient moved to pod C pending consult.  Medications  LORazepam (ATIVAN) tablet 0.5 mg (0.5 mg Oral Given 10/08/14 1347)    Filed Vitals:   10/08/14 1145 10/08/14 1200 10/08/14 1215 10/08/14 1230  BP: 140/94 136/92 151/100 152/93  Pulse: 95 96 90 86  Temp:      TempSrc:      Resp: SpO2: 100% 100% 100% 100%    Final diagnoses:  Palpitations  Anxiety  Anemia, unspecified anemia type       Enid Skeens, MD 10/08/14 1353

## 2014-10-08 NOTE — BH Assessment (Addendum)
Assessment Note  Leslie Golden is an 49 y.o. female with history of Bipolar I Disorder presents after palpitations and elevated heart rate. Patient felt over whelmed at work this morning after ongoing issues with management at her job with [employer].  Sts that she has worked for [employer]6 months. Reports that she has been lied on and also talked about  by her management so much so it's caused her increased anxiety. Patient overheard her supervisors talking about her today and led to the following symptoms: dry mouth, difficulty breathing, and pain in shoulder. Patient sts that she was having a panic attack. Sts that she had a prior panic attack 09/2013 which was also caused by conflict with management at her [employer]. Patient denies SI, HI, and AVH's. She reports decreased appetite and sleep. She admits to daily THC use. Last use was yesterday.Patient does not have a outpatient mental health provider. 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    Axis I: Bipolar I Disorder, Mood Disorder NOS,Anxiety Disorder NOS, and Cannabis Abuse  Axis II: Deferred Axis III:  Past Medical History  Diagnosis Date  . Hypertension   . Bipolar 1 disorder    Axis IV: other psychosocial or environmental problems, problems related to social environment, problems with access to health care services and problems with primary support group Axis V: 51-60 moderate symptoms  Past Medical History:  Past Medical History  Diagnosis Date  . Hypertension   . Bipolar 1 disorder     Past Surgical History  Procedure Laterality Date  . Eye surgery    . Vaginal wound closure / repair      Family History:  Family History  Problem Relation Age of Onset  . Heart failure Father     Died of a heart attack  . Heart failure Mother     Diet heart attack    Social History:  reports that she has been smoking Cigarettes.  She has smoked for  the past .5 years. She does not have any smokeless tobacco history on file. She reports that she does not drink alcohol or use illicit drugs.  Additional Social History:  Alcohol / Drug Use Pain Medications: SEE MAR Prescriptions: SEE MAR Over the Counter: SEE MAR History of alcohol / drug use?: No history of alcohol / drug abuse  CIWA: CIWA-Ar BP: 165/100 mmHg Pulse Rate: 87 COWS:    Allergies: No Known Allergies  Home Medications:  (Not in a hospital admission)  OB/GYN Status:  Patient's last menstrual period was 09/28/2014.  General Assessment Data Location of Assessment: [facility] Is this a Tele or Face-to-Face Assessment?: Face-to-Face Is this an Initial Assessment or a Re-assessment for this encounter?: Initial Assessment Living Arrangements: Other (Comment), Spouse/significant other (lives with boyfriend ) Can pt return to current living arrangement?: No Admission Status: Voluntary Is patient capable of signing voluntary admission?: Yes Transfer from: Acute Hospital Referral Source: [facility]Family/Friend     [facility] Living Arrangements: Other (Comment), Spouse/significant other (lives with boyfriend ) Name of Psychiatrist:  (No psychiatrist ) Name of Therapist:  (No therapist )     Risk to self with the past 6 months Suicidal Ideation: No Suicidal Intent: No Is patient at risk for suicide?: No Suicidal Plan?: No Access to Means: No What has been your use of drugs/alcohol within the last 12 months?:  (n/a) Previous Attempts/Gestures: No How many times?:  (n/a) Other  Self Harm Risks:  (n/a) Triggers for Past Attempts: Other (Comment) (patient calm and cooperative ) Intentional Self Injurious Behavior: None Family Suicide History: No Recent stressful life event(s): Other (Comment) (patient reports stress on her job; stress with managment ) Persecutory voices/beliefs?: No Depression: Yes Depression Symptoms: Feeling angry/irritable, Loss of interest in  usual pleasures, Fatigue, Isolating Substance abuse history and/or treatment for substance abuse?: No Suicide prevention information given to non-admitted patients: Not applicable  Risk to Others within the past 6 months Homicidal Ideation: No Thoughts of Harm to Others: No Current Homicidal Intent: No Current Homicidal Plan: No Access to Homicidal Means: No Identified Victim:  (n/a) History of harm to others?: No Assessment of Violence: None Noted Violent Behavior Description:  (patient calm and cooperative ) Does patient have access to weapons?: No Criminal Charges Pending?: No Does patient have a court date: No  Psychosis Hallucinations: None noted Delusions: None noted  Mental Status Report Appear/Hygiene: Disheveled Eye Contact: Good Motor Activity: Freedom of movement Speech: Logical/coherent Level of Consciousness: Alert Mood: Depressed Affect: Appropriate to circumstance Anxiety Level: Minimal Thought Processes: Coherent, Relevant Judgement: Unimpaired Orientation: Person, Place, Time, Situation Obsessive Compulsive Thoughts/Behaviors: None  Cognitive Functioning Concentration: Decreased Memory: Recent Intact, Remote Intact IQ: Average Insight: Fair Impulse Control: Good Appetite: Fair Weight Loss:  (none reported ) Weight Gain:  (none reported ) Sleep: No Change Total Hours of Sleep:  (varies) Vegetative Symptoms: None  ADLScreening [facility] Assessment Services) Patient's cognitive ability adequate to safely complete daily activities?: Yes Patient able to express need for assistance with ADLs?: No Independently performs ADLs?: Yes (appropriate for developmental age)  Prior Inpatient Therapy Prior Inpatient Therapy: Yes Prior Therapy Dates:  (1992) Prior Therapy Facilty/Provider(s):  (facility in [place]) Reason for Treatment:  (Post Partum Depression)  Prior Outpatient Therapy Prior Outpatient Therapy: No Prior Therapy Dates:  (n/a) Prior Therapy  Facilty/Provider(s):  (n/a) Reason for Treatment:  (n/a)  ADL Screening (condition at time of admission) Patient's cognitive ability adequate to safely complete daily activities?: Yes Is the patient deaf or have difficulty hearing?: No Does the patient have difficulty seeing, even when wearing glasses/contacts?: No Does the patient have difficulty concentrating, remembering, or making decisions?: Yes Patient able to express need for assistance with ADLs?: No Does the patient have difficulty dressing or bathing?: No Independently performs ADLs?: Yes (appropriate for developmental age) Does the patient have difficulty walking or climbing stairs?: No Weakness of Legs: None Weakness of Arms/Hands: None  Home Assistive Devices/Equipment Home Assistive Devices/Equipment: None    Abuse/Neglect Assessment (Assessment to be complete while patient is alone) Physical Abuse: Denies Verbal Abuse: Denies Sexual Abuse: Denies Exploitation of patient/patient's resources: Denies Self-Neglect: Denies Values / Beliefs Cultural Requests During Hospitalization: None Spiritual Requests During Hospitalization: None   Advance Directives (For Healthcare) Does patient have an advance directive?: No Would patient like information on creating an advanced directive?: No - patient declined information    Additional Information 1:1 In Past 12 Months?: No CIRT Risk: No Elopement Risk: No Does patient have medical clearance?: Yes     Disposition:  Disposition Initial Assessment Completed for this Encounter: Yes Disposition of Patient: Other dispositions, Outpatient treatment (Ran by Julieanne Cotton, NP.. D/C with outpatient resources) Type of outpatient treatment: [facility] [place]   Plan of care discussed with Dr. Jodi Mourning whom agreed with plan of care to discharge patient with outpatient referrals.  On Site Evaluation by:   Reviewed with Physician:    Melynda Ripple  M[place] 10/08/2014 2:55 PM

## 2014-10-08 NOTE — ED Notes (Signed)
Pt requesting to speak to a counselor. Pt denies SI/HI at this time, states she is very anxious and needs to see someone about it. Dr. Jodi MourningZavitz notified.

## 2014-10-08 NOTE — Discharge Instructions (Signed)
Discussed wearing monitor for your transient elevated heart rate with your doctor. Follow-up with [facility] at [phone] If you were given medicines take as directed.  If you are on coumadin or contraceptives realize their levels and effectiveness is altered by many different medicines.  If you have any reaction (rash, tongues swelling, other) to the medicines stop taking and see a physician.   Please follow up as directed and return to the ER or see a physician for new or worsening symptoms.  Thank you. Filed Vitals:   10/08/14 1115 10/08/14 1130 10/08/14 1145 10/08/14 1200  BP: 137/90 141/101 140/94 136/92  Pulse: 98 97 95 96  Temp:      TempSrc:      Resp: 11 15 18 13   SpO2: 100% 100% 100% 100%

## 2014-10-08 NOTE — ED Notes (Signed)
Per [facility], pt was at work, started having right shoulder pain, felt like her heart was racing, called EMS. Pt was in SVT rate 170's, when EMS placed the IV, pt spontaneously converted. HR now 97, sinus rhythm. Pt denies pain at this time, denies CP, Denies SOB. Pt AAOX4, in NAD. Ambulatory upon arrival to ED.

## 2014-10-08 NOTE — BH Assessment (Signed)
Writer informed TTS ([place]) of the consult.  

## 2014-10-08 NOTE — BH Assessment (Signed)
Writer asked the nurse in Peds to [facility] so [facility] patient can be assessed.

## 2014-10-08 NOTE — BH Assessment (Signed)
Writer informed of the consult.  The Tele Psych machine is in use presently.

## 2014-10-31 ENCOUNTER — Ambulatory Visit: Payer: Self-pay | Admitting: Internal Medicine

## 2015-02-05 ENCOUNTER — Emergency Department (HOSPITAL_COMMUNITY)
Admission: EM | Admit: 2015-02-05 | Discharge: 2015-02-05 | Disposition: A | Discharge status: Home / Self Care | Payer: 59 | Attending: Emergency Medicine | Admitting: Emergency Medicine

## 2015-02-05 ENCOUNTER — Emergency Department (HOSPITAL_COMMUNITY): Payer: 59

## 2015-02-05 ENCOUNTER — Encounter (HOSPITAL_COMMUNITY): Payer: Self-pay | Admitting: Emergency Medicine

## 2015-02-05 DIAGNOSIS — R197 Diarrhea, unspecified: Secondary | ICD-10-CM

## 2015-02-05 DIAGNOSIS — R112 Nausea with vomiting, unspecified: Secondary | ICD-10-CM

## 2015-02-05 DIAGNOSIS — D649 Anemia, unspecified: Secondary | ICD-10-CM | POA: Diagnosis not present

## 2015-02-05 DIAGNOSIS — K529 Noninfective gastroenteritis and colitis, unspecified: Secondary | ICD-10-CM

## 2015-02-05 DIAGNOSIS — I10 Essential (primary) hypertension: Secondary | ICD-10-CM | POA: Insufficient documentation

## 2015-02-05 DIAGNOSIS — Z3202 Encounter for pregnancy test, result negative: Secondary | ICD-10-CM | POA: Insufficient documentation

## 2015-02-05 DIAGNOSIS — Z720 Tobacco use: Secondary | ICD-10-CM | POA: Insufficient documentation

## 2015-02-05 LAB — URINALYSIS, ROUTINE W REFLEX MICROSCOPIC
BILIRUBIN URINE: NEGATIVE
GLUCOSE, UA: NEGATIVE mg/dL
Hgb urine dipstick: NEGATIVE
Ketones, ur: NEGATIVE mg/dL
Leukocytes, UA: NEGATIVE
Nitrite: POSITIVE — AB
Protein, ur: NEGATIVE mg/dL
SPECIFIC GRAVITY, URINE: 1.021 (ref 1.005–1.030)
UROBILINOGEN UA: 0.2 mg/dL (ref 0.0–1.0)
pH: 6.5 (ref 5.0–8.0)

## 2015-02-05 LAB — COMPREHENSIVE METABOLIC PANEL
ALBUMIN: 4.3 g/dL (ref 3.5–5.0)
ALT: 11 U/L — AB (ref 14–54)
ANION GAP: 9 (ref 5–15)
AST: 21 U/L (ref 15–41)
Alkaline Phosphatase: 56 U/L (ref 38–126)
BUN: 14 mg/dL (ref 6–20)
CO2: 24 mmol/L (ref 22–32)
CREATININE: 0.89 mg/dL (ref 0.44–1.00)
Calcium: 9 mg/dL (ref 8.9–10.3)
Chloride: 106 mmol/L (ref 101–111)
GFR calc Af Amer: >60 mL/min (ref >60)
GFR calc non Af Amer: >60 mL/min (ref >60)
GLUCOSE: 87 mg/dL (ref 65–99)
Potassium: 3.8 mmol/L (ref 3.5–5.1)
SODIUM: 139 mmol/L (ref 135–145)
Total Bilirubin: 0.7 mg/dL (ref 0.3–1.2)
Total Protein: 8.1 g/dL (ref 6.5–8.1)

## 2015-02-05 LAB — CBC WITH DIFFERENTIAL/PLATELET
BASOS PCT: 0 % (ref 0–1)
Basophils Absolute: 0 10*3/uL (ref 0.0–0.1)
Eosinophils Absolute: 0.1 10*3/uL (ref 0.0–0.7)
Eosinophils Relative: 1 % (ref 0–5)
HEMATOCRIT: 28.4 % — AB (ref 36.0–46.0)
HEMOGLOBIN: 8.4 g/dL — AB (ref 12.0–15.0)
LYMPHS PCT: 28 % (ref 12–46)
Lymphs Abs: 2.7 10*3/uL (ref 0.7–4.0)
MCH: 20.7 pg — ABNORMAL LOW (ref 26.0–34.0)
MCHC: 29.6 g/dL — ABNORMAL LOW (ref 30.0–36.0)
MCV: 70 fL — AB (ref 78.0–100.0)
MONO ABS: 0.5 10*3/uL (ref 0.1–1.0)
MONOS PCT: 5 % (ref 3–12)
Neutro Abs: 6.4 10*3/uL (ref 1.7–7.7)
Neutrophils Relative %: 66 % (ref 43–77)
Platelets: 333 10*3/uL (ref 150–400)
RBC: 4.06 MIL/uL (ref 3.87–5.11)
RDW: 19.9 % — ABNORMAL HIGH (ref 11.5–15.5)
WBC: 9.7 10*3/uL (ref 4.0–10.5)

## 2015-02-05 LAB — URINE MICROSCOPIC-ADD ON

## 2015-02-05 LAB — LIPASE, BLOOD: Lipase: 13 U/L — ABNORMAL LOW (ref 22–51)

## 2015-02-05 LAB — PREGNANCY, URINE: Preg Test, Ur: NEGATIVE

## 2015-02-05 MED ORDER — METOCLOPRAMIDE HCL 5 MG/ML IJ SOLN
10.0000 mg | Freq: Once | INTRAMUSCULAR | Status: AC
  Administered 2015-02-05: 10 mg via INTRAVENOUS
  Filled 2015-02-05: qty 2

## 2015-02-05 MED ORDER — OXYCODONE-ACETAMINOPHEN 5-325 MG PO TABS: 1.0000 | ORAL_TABLET | Freq: Four times a day (QID) | ORAL | Status: DC | PRN

## 2015-02-05 MED ORDER — MORPHINE SULFATE 4 MG/ML IJ SOLN
4.0000 mg | Freq: Once | INTRAMUSCULAR | Status: AC
  Administered 2015-02-05: 4 mg via INTRAVENOUS
  Filled 2015-02-05: qty 1

## 2015-02-05 MED ORDER — FERROUS SULFATE 325 (65 FE) MG PO TABS: 325.0000 mg | ORAL_TABLET | Freq: Every day | ORAL | Status: DC

## 2015-02-05 MED ORDER — SODIUM CHLORIDE 0.9 % IV BOLUS (SEPSIS)
1000.0000 mL | Freq: Once | INTRAVENOUS | Status: AC
  Administered 2015-02-05: 1000 mL via INTRAVENOUS

## 2015-02-05 MED ORDER — PROMETHAZINE HCL 25 MG PO TABS: 25.0000 mg | ORAL_TABLET | Freq: Four times a day (QID) | ORAL | Status: DC | PRN

## 2015-02-05 MED ORDER — ONDANSETRON HCL 4 MG/2ML IJ SOLN
4.0000 mg | Freq: Once | INTRAMUSCULAR | Status: AC
  Administered 2015-02-05: 4 mg via INTRAVENOUS
  Filled 2015-02-05: qty 2

## 2015-02-05 NOTE — ED Provider Notes (Signed)
CSN: [id number]     Arrival date & time 02/05/15  1311 History   First MD Initiated Contact with Patient 02/05/15 1317     Chief Complaint  Patient presents with  . Emesis     (Consider location/radiation/quality/duration/timing/severity/associated sxs/prior Treatment) HPI    PCP: MARSHALL,BERNARD A, MD Blood pressure 161/107, pulse 63, temperature 98.1 F (36.7 C), temperature source Oral, resp. rate 18, last menstrual period 12/27/2014, SpO2 100 %.  Leslie Golden is a 49 y.o.female with a significant PMH of hypertension and anemia presents to the ER with complaints of nausea, vomiting, diarrhea. She works at [employer] and many of her colleagues have gotten sick with the same thing. Her symptoms started 3 days ago and they all started at the same time. She denies taking any over-the-counter medicine or trying any remedies to help with her symptoms. He reports bloating, gas and cramping diffuse within her abdomen and has not been able to keep food down. She denies have any rectal bleeding she says that her stool has been clear and watery. Denies any vaginal discharge or bleeding. Denies any dysuria. She does admit to having very heavy menstrual cycles.  The patient denies having any symptoms of fevers, headaches, weakness, syncope, chest pain, shortness of breath, rash.    Past Medical History  Diagnosis Date  . Hypertension    Past Surgical History  Procedure Laterality Date  . Eye surgery    . Vaginal wound closure / repair     Family History  Problem Relation Age of Onset  . Heart failure Father     Died of a heart attack  . Heart failure Mother     Diet heart attack   History  Substance Use Topics  . Smoking status: Current Every Day Smoker -- .5 years    Types: Cigarettes  . Smokeless tobacco: Not on file  . Alcohol Use: No   OB History    No data available     Review of Systems  10 Systems reviewed and are negative for acute change except as noted in the HPI.     Allergies  Review of patient's allergies indicates no known allergies.  Home Medications   Prior to Admission medications   Medication Sig Start Date End Date Taking? Authorizing Provider  cephALEXin (KEFLEX) 500 MG capsule Take 1 capsule (500 mg total) by mouth 4 (four) times daily. Patient not taking: Reported on 08/19/2014 06/01/14   Mancel Bale, MD  HYDROcodone-acetaminophen (NORCO/VICODIN) 5-325 MG per tablet Take 1-2 tablets by mouth every 6 (six) hours as needed for moderate pain or severe pain. Patient not taking: Reported on 02/05/2015 09/28/14   Roxy Horseman, PA-C  LORazepam (ATIVAN) 1 MG tablet Take 1 tablet (1 mg total) by mouth every 6 (six) hours as needed for anxiety. Patient not taking: Reported on 02/05/2015 09/28/14   Roxy Horseman, PA-C  ondansetron (ZOFRAN) 8 MG tablet Take 1 tablet (8 mg total) by mouth every 8 (eight) hours as needed for nausea or vomiting. Patient not taking: Reported on 08/19/2014 06/01/14   Mancel Bale, MD  oxyCODONE-acetaminophen (PERCOCET/ROXICET) 5-325 MG per tablet Take 1-2 tablets by mouth every 4 (four) hours as needed for moderate pain or severe pain. Patient not taking: Reported on 02/05/2015 08/19/14   Everlene Farrier, PA-C  penicillin v potassium (VEETID) 500 MG tablet Take 1 tablet (500 mg total) by mouth 4 (four) times daily. Patient not taking: Reported on 02/05/2015 08/19/14   Everlene Farrier, PA-C  BP 161/107 mmHg  Pulse 63  Temp(Src) 98.1 F (36.7 C) (Oral)  Resp 18  SpO2 100%  LMP 12/27/2014 (Approximate) Physical Exam  Constitutional: She appears well-developed and well-nourished. No distress.  HENT:  Head: Normocephalic and atraumatic.  Eyes: Pupils are equal, round, and reactive to light.  Neck: Normal range of motion. Neck supple.  Cardiovascular: Normal rate and regular rhythm.   Pulmonary/Chest: Effort normal.  Abdominal: Soft. Bowel sounds are normal. She exhibits distension. She exhibits no fluid wave. There is  no tenderness. There is no rigidity, no rebound and no guarding.  Genitourinary:  Pt refuses rectal exam. Says she checks her stool every time she goes and her diarrhea has been watery and mainly clear.   Neurological: She is alert.  Skin: Skin is warm and dry.  Nursing note and vitals reviewed.   ED Course  Procedures (including critical care time) Labs Review Labs Reviewed  CBC WITH DIFFERENTIAL/PLATELET - Abnormal; Notable for the following:    Hemoglobin 8.4 (*)    HCT 28.4 (*)    MCV 70.0 (*)    MCH 20.7 (*)    MCHC 29.6 (*)    RDW 19.9 (*)    All other components within normal limits  COMPREHENSIVE METABOLIC PANEL - Abnormal; Notable for the following:    ALT 11 (*)    All other components within normal limits  LIPASE, BLOOD - Abnormal; Notable for the following:    Lipase 13 (*)    All other components within normal limits  URINALYSIS, ROUTINE W REFLEX MICROSCOPIC (NOT AT [facility]) - Abnormal; Notable for the following:    APPearance CLOUDY (*)    Nitrite POSITIVE (*)    All other components within normal limits  URINE MICROSCOPIC-ADD ON - Abnormal; Notable for the following:    Squamous Epithelial / LPF MANY (*)    Bacteria, UA MANY (*)    All other components within normal limits  URINE CULTURE  PREGNANCY, URINE    Imaging Review Dg Abd 2 Views  02/05/2015   CLINICAL DATA:  Three-day history of abdominal cramping and vomiting  EXAM: ABDOMEN - 2 VIEW  COMPARISON:  July 22, 2012  FINDINGS: Supine and upright images obtained. There is no bowel dilatation or air-fluid level suggesting obstruction. No free air. There are phleboliths in the pelvis. Lung bases are clear.  IMPRESSION: Bowel gas pattern unremarkable, without obstruction or free air.   Electronically Signed   By: Bretta Bang III M.D.   On: 02/05/2015 15:28     EKG Interpretation None      MDM   Final diagnoses:  Nausea vomiting and diarrhea  Anemia, unspecified anemia type    Her hemoglobin  is 8.4, her baseline runs betweetn 9.9- 9.6, she attributes this to heavy menstrual cycles, denies rectal bleeding and declines rectal exam x 2. Her urine is nitrite positive but appears to be contaminated, will send out urine culture. The rest of her labwork is unremarkable. Negative lipase and CMP. Her xray shows unremarkable bowel gas pattern.  I will prescribe pain medication, nausea medication, and iron supplements. Referral to women for vaginal bleeding.  Referral to GI or PCP for gasteroenteritis.  Medications  sodium chloride 0.9 % bolus 1,000 mL (1,000 mLs Intravenous New Bag/Given 02/05/15 1423)  ondansetron (ZOFRAN) injection 4 mg (4 mg Intravenous Given 02/05/15 1423)  morphine 4 MG/ML injection 4 mg (4 mg Intravenous Given 02/05/15 1452)  metoCLOPramide (REGLAN) injection 10 mg (10 mg Intravenous Given 02/05/15  1452)    49 y.o.Atina T Knotek's evaluation in the Emergency Department is complete. It has been determined that no acute conditions requiring further emergency intervention are present at this time. The patient/guardian have been advised of the diagnosis and plan. We have discussed signs and symptoms that warrant return to the ED, such as changes or worsening in symptoms.  Vital signs are stable at discharge. Filed Vitals:   02/05/15 1536  BP: 161/107  Pulse: 63  Temp:   Resp: 18    Patient/guardian has voiced understanding and agreed to follow-up with the PCP or specialist. She has an appointment with Dr. Gaynell Face next week which she says she will attend.  Pt admits to significant relief from symptoms with interventions given in the ED.   Marlon Pel, PA-C 02/05/15 1608  Bethann Berkshire, MD 02/06/15 1723

## 2015-02-05 NOTE — ED Notes (Signed)
Pt offered 218 mL of sprite, tolerates well, denies nausea no episode of emesis. Also reports pain relief.

## 2015-02-05 NOTE — ED Notes (Signed)
Patient transported to X-ray 

## 2015-02-05 NOTE — Discharge Instructions (Signed)
Anemia, Nonspecific Anemia is a condition in which the concentration of red blood cells or hemoglobin in the blood is below normal. Hemoglobin is a substance in red blood cells that carries oxygen to the tissues of the body. Anemia results in not enough oxygen reaching these tissues.  CAUSES  Common causes of anemia include:   Excessive bleeding. Bleeding may be internal or external. This includes excessive bleeding from periods (in women) or from the intestine.   Poor nutrition.   Chronic kidney, thyroid, and liver disease.  Bone marrow disorders that decrease red blood cell production.  Cancer and treatments for cancer.  HIV, AIDS, and their treatments.  Spleen problems that increase red blood cell destruction.  Blood disorders.  Excess destruction of red blood cells due to infection, medicines, and autoimmune disorders. SIGNS AND SYMPTOMS   Minor weakness.   Dizziness.   Headache.  Palpitations.   Shortness of breath, especially with exercise.   Paleness.  Cold sensitivity.  Indigestion.  Nausea.  Difficulty sleeping.  Difficulty concentrating. Symptoms may occur suddenly or they may develop slowly.  DIAGNOSIS  Additional blood tests are often needed. These help your health care provider determine the best treatment. Your health care provider will check your stool for blood and look for other causes of blood loss.  TREATMENT  Treatment varies depending on the cause of the anemia. Treatment can include:   Supplements of iron, vitamin B12, or folic acid.   Hormone medicines.   A blood transfusion. This may be needed if blood loss is severe.   Hospitalization. This may be needed if there is significant continual blood loss.   Dietary changes.  Spleen removal. HOME CARE INSTRUCTIONS Keep all follow-up appointments. It often takes many weeks to correct anemia, and having your health care provider check on your condition and your response to  treatment is very important. SEEK IMMEDIATE MEDICAL CARE IF:   You develop extreme weakness, shortness of breath, or chest pain.   You become dizzy or have trouble concentrating.  You develop heavy vaginal bleeding.   You develop a rash.   You have bloody or black, tarry stools.   You faint.   You vomit up blood.   You vomit repeatedly.   You have abdominal pain.  You have a fever or persistent symptoms for more than 2-3 days.   You have a fever and your symptoms suddenly get worse.   You are dehydrated.  MAKE SURE YOU:  Understand these instructions.  Will watch your condition.  Will get help right away if you are not doing well or get worse. Document Released: 09/17/2004 Document Revised: 04/12/2013 Document Reviewed: 02/03/2013 [facility] Patient Information 2015 [place], [place]. This information is not intended to replace advice given to you by your health care provider. Make sure you discuss any questions you have with your health care provider. Viral Gastroenteritis Viral gastroenteritis is also known as stomach flu. This condition affects the stomach and intestinal tract. It can cause sudden diarrhea and vomiting. The illness typically lasts 3 to 8 days. Most people develop an immune response that eventually gets rid of the virus. While this natural response develops, the virus can make you quite ill. CAUSES  Many different viruses can cause gastroenteritis, such as rotavirus or noroviruses. You can catch one of these viruses by consuming contaminated food or water. You may also catch a virus by sharing utensils or other personal items with an infected person or by touching a contaminated surface. SYMPTOMS  The most common symptoms are diarrhea and vomiting. These problems can cause a severe loss of body fluids (dehydration) and a body salt (electrolyte) imbalance. Other symptoms may include:  Fever.  Headache.  Fatigue.  Abdominal pain. DIAGNOSIS    Your caregiver can usually diagnose viral gastroenteritis based on your symptoms and a physical exam. A stool sample may also be taken to test for the presence of viruses or other infections. TREATMENT  This illness typically goes away on its own. Treatments are aimed at rehydration. The most serious cases of viral gastroenteritis involve vomiting so severely that you are not able to keep fluids down. In these cases, fluids must be given through an intravenous line (IV). HOME CARE INSTRUCTIONS   Drink enough fluids to keep your urine clear or pale yellow. Drink small amounts of fluids frequently and increase the amounts as tolerated.  Ask your caregiver for specific rehydration instructions.  Avoid:  Foods high in sugar.  Alcohol.  Carbonated drinks.  Tobacco.  Juice.  Caffeine drinks.  Extremely hot or cold fluids.  Fatty, greasy foods.  Too much intake of anything at one time.  Dairy products until 24 to 48 hours after diarrhea stops.  You may consume probiotics. Probiotics are active cultures of beneficial bacteria. They may lessen the amount and number of diarrheal stools in adults. Probiotics can be found in yogurt with active cultures and in supplements.  Wash your hands well to avoid spreading the virus.  Only take over-the-counter or prescription medicines for pain, discomfort, or fever as directed by your caregiver. Do not give aspirin to children. Antidiarrheal medicines are not recommended.  Ask your caregiver if you should continue to take your regular prescribed and over-the-counter medicines.  Keep all follow-up appointments as directed by your caregiver. SEEK IMMEDIATE MEDICAL CARE IF:   You are unable to keep fluids down.  You do not urinate at least once every 6 to 8 hours.  You develop shortness of breath.  You notice blood in your stool or vomit. This may look like coffee grounds.  You have abdominal pain that increases or is concentrated in one  small area (localized).  You have persistent vomiting or diarrhea.  You have a fever.  The patient is a child younger than 3 months, and he or she has a fever.  The patient is a child older than 3 months, and he or she has a fever and persistent symptoms.  The patient is a child older than 3 months, and he or she has a fever and symptoms suddenly get worse.  The patient is a baby, and he or she has no tears when crying. MAKE SURE YOU:   Understand these instructions.  Will watch your condition.  Will get help right away if you are not doing well or get worse. Document Released: 08/10/2005 Document Revised: 11/02/2011 Document Reviewed: 05/27/2011 [facility] Patient Information 2015 [place], [place]. This information is not intended to replace advice given to you by your health care provider. Make sure you discuss any questions you have with your health care provider.

## 2015-02-05 NOTE — ED Notes (Signed)
Pt c/o abd cramping and vomiting x 3 days. Pt states she isnt able to keep anything down.

## 2015-02-08 LAB — URINE CULTURE

## 2015-02-09 NOTE — Progress Notes (Signed)
ED Antimicrobial Stewardship Positive Culture Follow Up   Leslie Golden is an 49 y.o. female who presented to [facility] on 02/05/2015 with a chief complaint of  Chief Complaint  Patient presents with  . Emesis    Recent Results (from the past 720 hour(s))  Urine culture     Status: None   Collection Time: 02/05/15  3:58 PM  Result Value Ref Range Status   Specimen Description URINE, RANDOM  Final   Special Requests NONE  Final   Culture   Final    ESCHERICHIA COLI Performed at [facility]    Report Status 02/08/2015 FINAL  Final   Organism ID, Bacteria ESCHERICHIA COLI  Final      Susceptibility   Escherichia coli - MIC*    AMPICILLIN 8 SENSITIVE Sensitive     CEFAZOLIN <=4 SENSITIVE Sensitive     CEFTRIAXONE <=1 SENSITIVE Sensitive     CIPROFLOXACIN <=0.25 SENSITIVE Sensitive     GENTAMICIN <=1 SENSITIVE Sensitive     LEVOFLOXACIN <=0.12 SENSITIVE Sensitive     NITROFURANTOIN <=16 SENSITIVE Sensitive     TOBRAMYCIN <=1 SENSITIVE Sensitive     TRIMETH/SULFA <=20 SENSITIVE Sensitive     PIP/TAZO <=4 SENSITIVE Sensitive     * ESCHERICHIA COLI    [x]  No treatment needed  49 YOF with abdominal cramping and vomiting x3d. Co-workers had similar illnesses, no urinary complaints. UA not indicative of infection - likely asymptomatic bacteruria - no treatment needed  New antibiotic prescription: No treatment needed  ED Provider: Elson Areas, PA-C  Rolley Sims 02/09/2015, 12:30 PM Infectious Diseases Pharmacist Phone# [phone]

## 2015-02-10 ENCOUNTER — Telehealth (HOSPITAL_COMMUNITY): Payer: Self-pay

## 2015-02-10 NOTE — Telephone Encounter (Signed)
Post ED Visit - Positive Culture Follow-up  Culture report reviewed by antimicrobial stewardship pharmacist: []  Marlou Sa, Pharm.D., BCPS []  Celedonio Miyamoto, Pharm.D., BCPS [x]  Georgina Pillion, Pharm.D., BCPS []  [place], [place].D., BCPS, AAHIVP []  Estella Husk, Pharm.D., BCPS, AAHIVP []  Elder Cyphers, [address].D., BCPS  Positive Urine culture, E Coli Chart reviewed by K. Sofia PA "No treatment for UTI"  Arvid Right 02/10/2015, 6:22 AM

## 2015-05-07 ENCOUNTER — Emergency Department (HOSPITAL_COMMUNITY)
Admission: EM | Admit: 2015-05-07 | Discharge: 2015-05-07 | Disposition: A | Discharge status: Home / Self Care | Payer: 59 | Attending: Emergency Medicine | Admitting: Emergency Medicine

## 2015-05-07 ENCOUNTER — Encounter (HOSPITAL_COMMUNITY): Payer: Self-pay | Admitting: Emergency Medicine

## 2015-05-07 DIAGNOSIS — I10 Essential (primary) hypertension: Secondary | ICD-10-CM | POA: Insufficient documentation

## 2015-05-07 DIAGNOSIS — Z79899 Other long term (current) drug therapy: Secondary | ICD-10-CM | POA: Insufficient documentation

## 2015-05-07 DIAGNOSIS — M7661 Achilles tendinitis, right leg: Secondary | ICD-10-CM

## 2015-05-07 DIAGNOSIS — Z720 Tobacco use: Secondary | ICD-10-CM | POA: Insufficient documentation

## 2015-05-07 MED ORDER — NAPROXEN 500 MG PO TABS: 500.0000 mg | ORAL_TABLET | Freq: Two times a day (BID) | ORAL | Status: DC

## 2015-05-07 MED ORDER — DIAZEPAM 5 MG PO TABS
5.0000 mg | ORAL_TABLET | Freq: Once | ORAL | Status: AC
  Administered 2015-05-07: 5 mg via ORAL
  Filled 2015-05-07: qty 1

## 2015-05-07 MED ORDER — METHOCARBAMOL 750 MG PO TABS: 750.0000 mg | ORAL_TABLET | Freq: Four times a day (QID) | ORAL | Status: DC

## 2015-05-07 NOTE — Discharge Instructions (Signed)
Achilles Tendinitis Achilles tendinitis is inflammation of the tough, cord-like band that attaches the lower muscles of your leg to your heel (Achilles tendon). It is usually caused by overusing the tendon and joint involved.  CAUSES Achilles tendinitis can happen because of:  A sudden increase in exercise or activity (such as running).  Doing the same exercises or activities (such as jumping) over and over.  Not warming up calf muscles before exercising.  Exercising in shoes that are worn out or not made for exercise.  Having arthritis or a bone growth on the back of the heel bone. This can rub against the tendon and hurt the tendon. SIGNS AND SYMPTOMS The most common symptoms are:  Pain in the back of the leg, just above the heel. The pain usually gets worse with exercise and better with rest.  Stiffness or soreness in the back of the leg, especially in the morning.  Swelling of the skin over the Achilles tendon.  Trouble standing on tiptoe. Sometimes, an Achilles tendon tears (ruptures). Symptoms of an Achilles tendon rupture can include:  Sudden, severe pain in the back of the leg.  Trouble putting weight on the foot or walking normally. DIAGNOSIS Achilles tendinitis will be diagnosed based on symptoms and a physical examination. An X-ray may be done to check if another condition is causing your symptoms. An MRI may be ordered if your health care provider suspects you may have completely torn your tendon, which is called an Achilles tendon rupture.  TREATMENT  Achilles tendinitis usually gets better over time. It can take weeks to months to heal completely. Treatment focuses on treating the symptoms and helping the injury heal. HOME CARE INSTRUCTIONS   Rest your Achilles tendon and avoid activities that cause pain.  Apply ice to the injured area:  Put ice in a plastic bag.  Place a towel between your skin and the bag.  Leave the ice on for 20 minutes, 2-3 times a  day  Try to avoid using the tendon (other than gentle range of motion) while the tendon is painful. Do not resume use until instructed by your health care provider. Then begin use gradually. Do not increase use to the point of pain. If pain does develop, decrease use and continue the above measures. Gradually increase activities that do not cause discomfort until you achieve normal use.  Do exercises to make your calf muscles stronger and more flexible. Your health care provider or physical therapist can recommend exercises for you to do.  Wrap your ankle with an elastic bandage or other wrap. This can help keep your tendon from moving too much. Your health care provider will show you how to wrap your ankle correctly.  Only take over-the-counter or prescription medicines for pain, discomfort, or fever as directed by your health care provider. SEEK MEDICAL CARE IF:   Your pain and swelling increase or pain is uncontrolled with medicines.  You develop new, unexplained symptoms or your symptoms get worse.  You are unable to move your toes or foot.  You develop warmth and swelling in your foot.  You have an unexplained temperature. MAKE SURE YOU:   Understand these instructions.  Will watch your condition.  Will get help right away if you are not doing well or get worse. Document Released: 05/20/2005 Document Revised: 05/31/2013 Document Reviewed: 03/22/2013 Exit[employer] Patient Information 2015 [employer]. This information is not intended to replace advice given to you by your health care provider. Make sure you discuss   any questions you have with your health care provider.  

## 2015-05-07 NOTE — ED Notes (Signed)
Pt states that she was hit in the right ankle by a pallet jack at work last Thursday.  Pt states that she has been having pain that radiates up her right leg since this past weekends.  Pt states that she has tried heating pad and taking flexeril but hasnt helped.

## 2015-05-07 NOTE — ED Provider Notes (Signed)
CSN: [id number]     Arrival date & time 05/07/15  1430 History   First MD Initiated Contact with Patient 05/07/15 1742     Chief Complaint  Patient presents with  . Leg Pain     (Consider location/radiation/quality/duration/timing/severity/associated sxs/prior Treatment) HPI Comments: Patient here complaining of pain at her left Achilles tendon times several days. Pain characterized as sharp and worse with standing and flexion of her foot. Has been using Motrin with limited relief. Denies any swelling to her calf. No shortness of breath. Pain better with rest. No prior history of same  Patient is a 49 y.o. female presenting with leg pain. The history is provided by the patient.  Leg Pain   Past Medical History  Diagnosis Date  . Hypertension    Past Surgical History  Procedure Laterality Date  . Eye surgery    . Vaginal wound closure / repair     Family History  Problem Relation Age of Onset  . Heart failure Father     Died of a heart attack  . Heart failure Mother     Diet heart attack   Social History  Substance Use Topics  . Smoking status: Current Every Day Smoker -- .5 years    Types: Cigarettes  . Smokeless tobacco: None  . Alcohol Use: No   OB History    No data available     Review of Systems  All other systems reviewed and are negative.     Allergies  Review of patient's allergies indicates no known allergies.  Home Medications   Prior to Admission medications   Medication Sig Start Date End Date Taking? Authorizing Provider  Cyclobenzaprine HCl (FLEXERIL PO) Take 1 tablet by mouth once.   Yes Historical Provider, MD  cephALEXin (KEFLEX) 500 MG capsule Take 1 capsule (500 mg total) by mouth 4 (four) times daily. Patient not taking: Reported on 08/19/2014 06/01/14   Mancel Bale, MD  ferrous sulfate 325 (65 FE) MG tablet Take 1 tablet (325 mg total) by mouth daily. Patient not taking: Reported on 05/07/2015 02/05/15   Marlon Pel, PA-C    HYDROcodone-acetaminophen (NORCO/VICODIN) 5-325 MG per tablet Take 1-2 tablets by mouth every 6 (six) hours as needed for moderate pain or severe pain. Patient not taking: Reported on 02/05/2015 09/28/14   Roxy Horseman, PA-C  LORazepam (ATIVAN) 1 MG tablet Take 1 tablet (1 mg total) by mouth every 6 (six) hours as needed for anxiety. Patient not taking: Reported on 02/05/2015 09/28/14   Roxy Horseman, PA-C  methocarbamol (ROBAXIN-750) 750 MG tablet Take 1 tablet (750 mg total) by mouth 4 (four) times daily. 05/07/15   Lorre Nick, MD  naproxen (NAPROSYN) 500 MG tablet Take 1 tablet (500 mg total) by mouth 2 (two) times daily. 05/07/15   Lorre Nick, MD  ondansetron (ZOFRAN) 8 MG tablet Take 1 tablet (8 mg total) by mouth every 8 (eight) hours as needed for nausea or vomiting. Patient not taking: Reported on 08/19/2014 06/01/14   Mancel Bale, MD  oxyCODONE-acetaminophen (PERCOCET/ROXICET) 5-325 MG per tablet Take 1-2 tablets by mouth every 4 (four) hours as needed for moderate pain or severe pain. Patient not taking: Reported on 02/05/2015 08/19/14   Everlene Farrier, PA-C  oxyCODONE-acetaminophen (PERCOCET/ROXICET) 5-325 MG per tablet Take 1 tablet by mouth every 6 (six) hours as needed for severe pain. Patient not taking: Reported on 05/07/2015 02/05/15   Marlon Pel, PA-C  penicillin v potassium (VEETID) 500 MG tablet Take 1 tablet (500 mg  total) by mouth 4 (four) times daily. Patient not taking: Reported on 02/05/2015 08/19/14   Everlene Farrier, PA-C  promethazine (PHENERGAN) 25 MG tablet Take 1 tablet (25 mg total) by mouth every 6 (six) hours as needed for nausea or vomiting. Patient not taking: Reported on 05/07/2015 02/05/15   Marlon Pel, PA-C   BP 176/110 mmHg  Pulse 88  Temp(Src) 98.5 F (36.9 C) (Oral)  Resp 18  SpO2 100%  LMP 04/20/2015 Physical Exam  Constitutional: She is oriented to person, place, and time. She appears well-developed and well-nourished.  Non-toxic appearance.  No distress.  HENT:  Head: Normocephalic and atraumatic.  Eyes: Conjunctivae, EOM and lids are normal. Pupils are equal, round, and reactive to light.  Neck: Normal range of motion. Neck supple. No tracheal deviation present. No thyroid mass present.  Cardiovascular: Normal rate, regular rhythm and normal heart sounds.  Exam reveals no gallop.   No murmur heard. Pulmonary/Chest: Effort normal and breath sounds normal. No stridor. No respiratory distress. She has no decreased breath sounds. She has no wheezes. She has no rhonchi. She has no rales.  Abdominal: Soft. Normal appearance and bowel sounds are normal. She exhibits no distension. There is no tenderness. There is no rebound and no CVA tenderness.  Musculoskeletal: Normal range of motion. She exhibits no edema or tenderness.       Legs: Neurological: She is alert and oriented to person, place, and time. She has normal strength. No cranial nerve deficit or sensory deficit. GCS eye subscore is 4. GCS verbal subscore is 5. GCS motor subscore is 6.  Skin: Skin is warm and dry. No abrasion and no rash noted.  Psychiatric: She has a normal mood and affect. Her speech is normal and behavior is normal.  Nursing note and vitals reviewed.   ED Course  Procedures (including critical care time) Labs Review Labs Reviewed - No data to display  Imaging Review No results found. I have personally reviewed and evaluated these images and lab results as part of my medical decision-making.   EKG Interpretation None      MDM   Final diagnoses:  Achilles tendinitis of right lower extremity    Patient to be treated for Achilles tendinitis. No concern for DVT    Lorre Nick, MD 05/07/15 1754

## 2015-06-24 ENCOUNTER — Encounter (HOSPITAL_COMMUNITY): Payer: Self-pay | Admitting: Emergency Medicine

## 2015-06-24 ENCOUNTER — Emergency Department (HOSPITAL_COMMUNITY)
Admission: EM | Admit: 2015-06-24 | Discharge: 2015-06-24 | Disposition: A | Discharge status: Home / Self Care | Payer: 59 | Attending: Emergency Medicine | Admitting: Emergency Medicine

## 2015-06-24 ENCOUNTER — Emergency Department (HOSPITAL_COMMUNITY): Payer: 59

## 2015-06-24 DIAGNOSIS — J069 Acute upper respiratory infection, unspecified: Secondary | ICD-10-CM | POA: Insufficient documentation

## 2015-06-24 DIAGNOSIS — R05 Cough: Secondary | ICD-10-CM

## 2015-06-24 DIAGNOSIS — Z79899 Other long term (current) drug therapy: Secondary | ICD-10-CM | POA: Insufficient documentation

## 2015-06-24 DIAGNOSIS — R110 Nausea: Secondary | ICD-10-CM

## 2015-06-24 DIAGNOSIS — R059 Cough, unspecified: Secondary | ICD-10-CM

## 2015-06-24 DIAGNOSIS — Z720 Tobacco use: Secondary | ICD-10-CM | POA: Insufficient documentation

## 2015-06-24 DIAGNOSIS — K5900 Constipation, unspecified: Secondary | ICD-10-CM | POA: Insufficient documentation

## 2015-06-24 DIAGNOSIS — G43909 Migraine, unspecified, not intractable, without status migrainosus: Secondary | ICD-10-CM | POA: Insufficient documentation

## 2015-06-24 DIAGNOSIS — I10 Essential (primary) hypertension: Secondary | ICD-10-CM | POA: Insufficient documentation

## 2015-06-24 DIAGNOSIS — Z3202 Encounter for pregnancy test, result negative: Secondary | ICD-10-CM | POA: Insufficient documentation

## 2015-06-24 DIAGNOSIS — D649 Anemia, unspecified: Secondary | ICD-10-CM | POA: Insufficient documentation

## 2015-06-24 DIAGNOSIS — R52 Pain, unspecified: Secondary | ICD-10-CM

## 2015-06-24 LAB — COMPREHENSIVE METABOLIC PANEL
ALT: 13 U/L — ABNORMAL LOW (ref 14–54)
AST: 27 U/L (ref 15–41)
Albumin: 4.2 g/dL (ref 3.5–5.0)
Alkaline Phosphatase: 74 U/L (ref 38–126)
Anion gap: 5 (ref 5–15)
BILIRUBIN TOTAL: 0.2 mg/dL — AB (ref 0.3–1.2)
BUN: 8 mg/dL (ref 6–20)
CO2: 26 mmol/L (ref 22–32)
CREATININE: 0.86 mg/dL (ref 0.44–1.00)
Calcium: 9.1 mg/dL (ref 8.9–10.3)
Chloride: 107 mmol/L (ref 101–111)
GFR calc Af Amer: >60 mL/min (ref >60)
Glucose, Bld: 104 mg/dL — ABNORMAL HIGH (ref 65–99)
Potassium: 3.8 mmol/L (ref 3.5–5.1)
Sodium: 138 mmol/L (ref 135–145)
Total Protein: 8.3 g/dL — ABNORMAL HIGH (ref 6.5–8.1)

## 2015-06-24 LAB — CBC
HCT: 31.2 % — ABNORMAL LOW (ref 36.0–46.0)
Hemoglobin: 9.3 g/dL — ABNORMAL LOW (ref 12.0–15.0)
MCH: 21.4 pg — ABNORMAL LOW (ref 26.0–34.0)
MCHC: 29.8 g/dL — ABNORMAL LOW (ref 30.0–36.0)
MCV: 71.7 fL — AB (ref 78.0–100.0)
Platelets: 330 10*3/uL (ref 150–400)
RBC: 4.35 MIL/uL (ref 3.87–5.11)
RDW: 19.2 % — AB (ref 11.5–15.5)
WBC: 8.3 10*3/uL (ref 4.0–10.5)

## 2015-06-24 LAB — URINALYSIS, ROUTINE W REFLEX MICROSCOPIC
BILIRUBIN URINE: NEGATIVE
GLUCOSE, UA: NEGATIVE mg/dL
HGB URINE DIPSTICK: NEGATIVE
KETONES UR: NEGATIVE mg/dL
Leukocytes, UA: NEGATIVE
NITRITE: NEGATIVE
PH: 7.5 (ref 5.0–8.0)
Protein, ur: NEGATIVE mg/dL
Specific Gravity, Urine: 1.018 (ref 1.005–1.030)
Urobilinogen, UA: 1 mg/dL (ref 0.0–1.0)

## 2015-06-24 LAB — DIFFERENTIAL
BASOS PCT: 1 %
Basophils Absolute: 0.1 10*3/uL (ref 0.0–0.1)
Eosinophils Absolute: 0.1 10*3/uL (ref 0.0–0.7)
Eosinophils Relative: 2 %
LYMPHS PCT: 22 %
Lymphs Abs: 1.9 10*3/uL (ref 0.7–4.0)
MONO ABS: 0.5 10*3/uL (ref 0.1–1.0)
Monocytes Relative: 5 %
NEUTROS ABS: 6.1 10*3/uL (ref 1.7–7.7)
NEUTROS PCT: 70 %

## 2015-06-24 LAB — PREGNANCY, URINE: Preg Test, Ur: NEGATIVE

## 2015-06-24 MED ORDER — IBUPROFEN 800 MG PO TABS: 800.0000 mg | ORAL_TABLET | Freq: Three times a day (TID) | ORAL | Status: DC

## 2015-06-24 MED ORDER — ONDANSETRON HCL 4 MG/2ML IJ SOLN
4.0000 mg | Freq: Once | INTRAMUSCULAR | Status: AC
  Administered 2015-06-24: 4 mg via INTRAVENOUS
  Filled 2015-06-24: qty 2

## 2015-06-24 MED ORDER — DIPHENHYDRAMINE HCL 50 MG/ML IJ SOLN
25.0000 mg | Freq: Once | INTRAMUSCULAR | Status: AC
  Administered 2015-06-24: 25 mg via INTRAVENOUS
  Filled 2015-06-24: qty 1

## 2015-06-24 MED ORDER — PROCHLORPERAZINE MALEATE 10 MG PO TABS
10.0000 mg | ORAL_TABLET | Freq: Once | ORAL | Status: AC
  Administered 2015-06-24: 10 mg via ORAL
  Filled 2015-06-24: qty 1

## 2015-06-24 MED ORDER — ONDANSETRON 4 MG PO TBDP: 4.0000 mg | ORAL_TABLET | Freq: Three times a day (TID) | ORAL | Status: DC | PRN

## 2015-06-24 MED ORDER — BENZONATATE 100 MG PO CAPS: 100.0000 mg | ORAL_CAPSULE | Freq: Three times a day (TID) | ORAL | Status: DC

## 2015-06-24 MED ORDER — DEXAMETHASONE SODIUM PHOSPHATE 10 MG/ML IJ SOLN
10.0000 mg | Freq: Once | INTRAMUSCULAR | Status: AC
  Administered 2015-06-24: 10 mg via INTRAVENOUS
  Filled 2015-06-24: qty 1

## 2015-06-24 MED ORDER — SODIUM CHLORIDE 0.9 % IV BOLUS (SEPSIS)
1000.0000 mL | Freq: Once | INTRAVENOUS | Status: AC
  Administered 2015-06-24: 1000 mL via INTRAVENOUS

## 2015-06-24 MED ORDER — PROPRANOLOL HCL 1 MG/ML IV SOLN
1.0000 mg | Freq: Once | INTRAVENOUS | Status: AC
  Administered 2015-06-24: 1 mg via INTRAVENOUS
  Filled 2015-06-24: qty 1

## 2015-06-24 NOTE — ED Notes (Signed)
Verbalized understanding discharge instructions. In no acute distress.   

## 2015-06-24 NOTE — ED Notes (Signed)
PA now at bedside evaluating pt. Will hold IV /meds until eval is complete.

## 2015-06-24 NOTE — ED Provider Notes (Signed)
CSN: [id number]     Arrival date & time 06/24/15  1153 History   First MD Initiated Contact with Patient 06/24/15 1504     Chief Complaint  Patient presents with  . Migraine  . Cough  . Nasal Congestion     (Consider location/radiation/quality/duration/timing/severity/associated sxs/prior Treatment) HPI   Leslie Golden is a 49 y.o. female, pt with history of HTN and anemia, presenting with abdominal cramping with constipation and pain with BM beginning 3 days ago. Pt states she then began having productive cough with clear sputum, nasal congestion, N/V/D, and itchiness to her skin. Pt has had poor oral intake. Pt states she has a history of anemia and migraines and that her current headache is consistent with previous headaches. No thunder clap headache, no visual disturbances, no worst headache of her life. Headache is bilateral, 8/10, and throbbing. Currently denies abdominal pain, chest pain, shortness of breath, fever/chills, constipation, hematochezia, or any other pain or complaints. Pt states she hasn't taken her BP medicine today. Pt hasn't taken anything to help with her symptoms.    Past Medical History  Diagnosis Date  . Hypertension    Past Surgical History  Procedure Laterality Date  . Eye surgery    . Vaginal wound closure / repair     Family History  Problem Relation Age of Onset  . Heart failure Father     Died of a heart attack  . Heart failure Mother     Diet heart attack   Social History  Substance Use Topics  . Smoking status: Current Every Day Smoker -- .5 years    Types: Cigarettes  . Smokeless tobacco: None  . Alcohol Use: No   OB History    No data available     Review of Systems  Constitutional: Negative for fever, chills, diaphoresis and unexpected weight change.  Respiratory: Positive for cough. Negative for chest tightness and shortness of breath.   Cardiovascular: Negative for chest pain, palpitations and leg swelling.  Gastrointestinal:  Negative for nausea, vomiting, abdominal pain, diarrhea and constipation.  Genitourinary: Negative for dysuria and flank pain.  Musculoskeletal: Positive for myalgias. Negative for back pain.  Skin: Negative for color change and pallor.  Neurological: Positive for headaches. Negative for dizziness, syncope, weakness and light-headedness.  All other systems reviewed and are negative.     Allergies  Review of patient's allergies indicates no known allergies.  Home Medications   Prior to Admission medications   Medication Sig Start Date End Date Taking? Authorizing Provider  ferrous sulfate 325 (65 FE) MG tablet Take 1 tablet (325 mg total) by mouth daily. 02/05/15  Yes Tiffany Neva [place], PA-C  ibuprofen (ADVIL,MOTRIN) 200 MG tablet Take 600 mg by mouth 2 (two) times daily as needed for headache or moderate pain.   Yes Historical Provider, MD  LORazepam (ATIVAN) 1 MG tablet Take 1 tablet (1 mg total) by mouth every 6 (six) hours as needed for anxiety. 09/28/14  Yes Roxy Horsemanobert Browning, PA-C  benzonatate (TESSALON) 100 MG capsule Take 1 capsule (100 mg total) by mouth every 8 (eight) hours. 06/24/15   Shawn C Joy, PA-C  ibuprofen (ADVIL,MOTRIN) 800 MG tablet Take 1 tablet (800 mg total) by mouth 3 (three) times daily. 06/24/15   Shawn C Joy, PA-C  ondansetron (ZOFRAN ODT) 4 MG disintegrating tablet Take 1 tablet (4 mg total) by mouth every 8 (eight) hours as needed for nausea or vomiting. 06/24/15   Shawn C Joy, PA-C   BP 172/106  mmHg  Pulse 70  Temp(Src) 98.3 F (36.8 C) (Oral)  Resp 22  SpO2 100%  LMP 06/10/2015 (Approximate) Physical Exam  Constitutional: She appears well-developed and well-nourished. No distress.  HENT:  Head: Normocephalic and atraumatic.  Eyes: Conjunctivae are normal. Pupils are equal, round, and reactive to light.  Cardiovascular: Normal rate, regular rhythm and normal heart sounds.   Pulmonary/Chest: Effort normal and breath sounds normal. No respiratory distress.   Abdominal: Soft. Bowel sounds are normal. There is generalized tenderness. There is no CVA tenderness.  Musculoskeletal: She exhibits no edema or tenderness.  Neurological: She is alert.  Skin: Skin is warm and dry. She is not diaphoretic.  Nursing note and vitals reviewed.   ED Course  Procedures (including critical care time) Labs Review Labs Reviewed  COMPREHENSIVE METABOLIC PANEL - Abnormal; Notable for the following:    Glucose, Bld 104 (*)    Total Protein 8.3 (*)    ALT 13 (*)    Total Bilirubin 0.2 (*)    All other components within normal limits  CBC - Abnormal; Notable for the following:    Hemoglobin 9.3 (*)    HCT 31.2 (*)    MCV 71.7 (*)    MCH 21.4 (*)    MCHC 29.8 (*)    RDW 19.2 (*)    All other components within normal limits  URINALYSIS, ROUTINE W REFLEX MICROSCOPIC (NOT AT [facility]) - Abnormal; Notable for the following:    APPearance CLOUDY (*)    All other components within normal limits  DIFFERENTIAL  PREGNANCY, URINE    Imaging Review Dg Chest 2 View  06/24/2015  CLINICAL DATA:  Cough, congestion, fever and body aches. EXAM: CHEST - 2 VIEW COMPARISON:  12/27/2012 FINDINGS: The heart size and mediastinal contours are within normal limits. There is no evidence of pulmonary edema, consolidation, pneumothorax, nodule or pleural fluid. The visualized skeletal structures are unremarkable. IMPRESSION: No active disease. Electronically Signed   By: Irish Lack M.D.   On: 06/24/2015 13:13   I have personally reviewed and evaluated these images and lab results as part of my medical decision-making.   EKG Interpretation None      MDM   Final diagnoses:  Migraine without status migrainosus, not intractable, unspecified migraine type  Nausea  Body aches  Cough  Upper respiratory infection    Leslie Golden presents with productive cough, muscle aches, N/V/D for that past 3 days.   Pt was taking propranolol  for HTN, but hasn't taken it for at  least two months because she can't afford it. Will order home HTN meds, IV fluids, migraine cocktail.  CXR shows no abnormalities. Suspect viral URI and migraine vs HTN headache. 4:34 PM Pt states she feels much better. Nausea is gone, headache is now 7/10 and more manageable. Pt to be reevaluated in about 30 minutes and then discharged. Communicated plan of care with pt, who agreed to plan. 5:14 PM Pt reevaluated, states her pain is about 6/10 and expresses a desire for discharge. Pt has no new symptoms and appears stable for discharge. Pt advised to return should anything get worse. Pt was advised to follow up with PCP for chronic BP management.  Anselm Pancoast, PA-C 06/24/15 1716  Pt was told that she would have to follow up with a PCP in about a week. Pt agrees to do this and adds that she can do this now because she now has insurance.   Anselm Pancoast, PA-C 06/24/15  1758  Bethann Berkshire, MD 06/24/15 [phone]

## 2015-06-24 NOTE — Discharge Instructions (Signed)
You have been seen today for a migraine, cough, body aches, and nausea. Your imaging and lab tests showed no abnormalities. Follow up with PCP as needed. Return to ED should symptoms worsen.   Emergency Department Resource Guide 1) Find a Doctor and Pay Out of Pocket Although you won't have to find out who is covered by your insurance plan, it is a good idea to ask around and get recommendations. You will then need to call the office and see if the doctor you have chosen will accept you as a new patient and what types of options they offer for patients who are self-pay. Some doctors offer discounts or will set up payment plans for their patients who do not have insurance, but you will need to ask so you aren't surprised when you get to your appointment.  2) Contact Your Local Health Department Not all health departments have doctors that can see patients for sick visits, but many do, so it is worth a call to see if yours does. If you don't know where your local health department is, you can check in your phone book. The CDC also has a tool to help you locate your state's health department, and many state websites also have listings of all of their local health departments.  3) Find a Walk-in Clinic If your illness is not likely to be very severe or complicated, you may want to try a walk in clinic. These are popping up all over the country in pharmacies, drugstores, and shopping centers. They're usually staffed by nurse practitioners or physician assistants that have been trained to treat common illnesses and complaints. They're usually fairly quick and inexpensive. However, if you have serious medical issues or chronic medical problems, these are probably not your best option.  No Primary Care Doctor: - Call Health Connect at  [phone] - they can help you locate a primary care doctor that  accepts your insurance, provides certain services, etc. - Physician Referral Service- [phone]  Chronic  Pain Problems: Organization         Address  Phone   Notes  [facility]  [phone] Patients need to be referred by their primary care doctor.   Medication Assistance: Organization         Address  Phone   Notes  [facility] [address] [place], [place] [address] [phone]) [phone] --Must be a resident of [facility] [place] -- Must have NO insurance coverage whatsoever (no Medicaid/ Medicare, etc.) -- The pt. MUST have a primary care doctor that directs their care regularly and follows them in the community   MedAssist  [phone]   [employer]  [phone]    Agencies that provide inexpensive medical care: Organization         Address  Phone   Notes  [facility]  [phone]   [facility]    [phone]   [facility] [address] [place], [place] [address] [phone]) [phone]   [facility] [address], [place] [phone]   [facility]anned Parenthood    [phone]   [facility]    [phone]   [facility]  [address], [place] Phone:  [phone], Fax:  [phone]) [phone] Hours of Operation:  9 am - 6 pm, M-F.  Also accepts Medicaid/Medicare and self-pay.  [facility]  [address], [place] Phone: [phone], Fax: [phone]. Hours of Operation:  8:30 am - 5:30 pm, M-F.  Also accepts Medicaid and self-pay.  °[facility] [place] [address], [place] Phone: [phone]   °[facility] [address], [place], [place] [phone], Ext. [phone] Mondays & Thursdays: 7-9 AM.  First 15 patients are seen on a first come, first serve basis. °  ° °Medicaid-accepting [place] Providers: ° °Organization         Address  Phone   Notes  °[facility] [address], [place] [phone] Also  accepts self-pay patients.  °[facility] [address], [place] ° [phone]   °[facility] [address], [place] [phone]   °[facility] [address] [place] [address], [place] [phone]   °Veita Bland [address], [place]  ° [phone] Only accepts [place] Access Medicaid patients after they have their name applied to their card.  ° °Self-Pay (no insurance) in [place]: ° °Organization         Address  Phone   Notes  °Sickle Cell Patients, [facility] [address], [place] [phone]   °[facility] [address], [place] [phone]   °[facility] [place] ° [address], [place] [phone]   °[facility]/Dr. Osei-Bonsu ° [address] [place] [address], [place] or [address], [place] [phone] Phone number for both [place] and [place] locations is the same.  °[facility] [address], [place] [phone]   °[facility] [address] [place] [address], [place] or [address] [phone] °([phone]) [phone]   °[facility] [address], [place] [phone], phone; [phone], fax Sees patients 1st and 3rd Saturday of every month.  Must not qualify for public or private insurance (i.e. Medicaid, Medicare, [place] Health Choice, Veterans' Benefits) • Household income should be no more than 200% of the poverty level •The clinic cannot treat you if you are pregnant or think you are pregnant • Sexually transmitted diseases are not treated at the clinic.  ° ° °Dental Care: °Organization         Address  Phone  Notes  °[place] [facility] [address], [place] [phone]) [phone] Accepts children up to age 21 who are enrolled in Medicaid or [place] Health Choice; pregnant  women with a Medicaid card; and children who have applied for Medicaid or [place] Health Choice, but were declined, whose parents can pay a reduced fee at time of service.  °[place] [facility] [place]  [address], [place] [phone]) [phone] Accepts children up to age 21 who are enrolled in Medicaid or [place] Health Choice; pregnant women with a Medicaid card; and children who have applied for Medicaid or  Health Choice, but were declined, whose parents can pay a reduced fee at time of service.  °[facility] ° [address],  [phone] Patients are seen by appointment only. Walk-ins are not accepted. [facility] will see patients 18 years of age and older. °Monday - Tuesday (8am-5pm) °Most Wednesdays (8:30-5pm) °$30 per visit, cash only  °[facility]  [address], [facility] [phone] Patients are seen by appointment only. Walk-ins are not accepted. [place] will see patients 60 years of age and older. One Wednesday Evening (Monthly: Volunteer Based).  $30 per visit, cash only  [place]  [phone] for adults; Children under age 4, call Graduate Pediatric Dentistry at [phone]. Children aged 49-14, please call [phone] to request a pediatric application.  Dental services are provided in all areas of dental care including fillings, crowns and bridges, complete and partial dentures, implants, gum treatment, root canals, and extractions. Preventive care is also provided. Treatment is provided to both adults and children. Patients are selected via a lottery and there is often a waiting list.   [facility] [address] [place]  [phone] www.drcivils.com   [facility] [address] [place], [place] [phone], Ext. [phone] Second and Fourth Thursday of each month, opens at 6:30 AM; Clinic ends at 9 AM.  Patients are  seen on a first-come first-served basis, and a limited number are seen during each clinic.   [facility]  [address] Hillard Danker [place], [place] [phone]   Eligibility Requirements You must have lived in [place], [place], or [place] counties for at least the last three months.   You cannot be eligible for state or federal sponsored [employer], including [employer], [place], or [employer].   You generally cannot be eligible for healthcare insurance through your employer.    How to apply: Eligibility screenings are held every Tuesday and Wednesday afternoon from 1:00 pm until 4:00 pm. You do not need an appointment for the interview!  [facility] [address] [place], [place]   [place] Department  [place]  [phone]    Behavioral Health Resources in the Community: Intensive Outpatient Programs Organization         Address  Phone  Notes  [facility] [place]. [address], [place], [place] [phone]   [facility] [address], [place], [place]   ADS: [facility] [address] [address], [place], [place]   [place] [address],  [place], [place] or [phone]   Substance Abuse Resources Organization         Address  Phone  Notes  [facility] Drug Services  [phone]   [place]  [phone]   [facility] [place]   Chinita Pester  [phone]   Residential & Outpatient Substance Abuse Program  [phone]   Psychological Services Organization         Address  Phone  Notes  [facility] [place]  [place]  [phone]   [place] [address] [place] [phone] or [phone]    Mobile Crisis  Teams Organization         Address  Phone  Notes  [facility], Mobile Crisis Care Unit  [phone]   [facility]  [address] [place], [place]   [facility] [address] [place] [phone]    Self-Help/Support Groups Organization         Address  Phone             Notes  Mental  [facility] of [place] - variety of support groups  [place] Call for more information  Narcotics Anonymous (NA), [facility] [address], [employer] [place]  2 meetings at this location   Special educational needs teacher         Address  Phone  Notes  [facility] [place],    [place]  [phone]   [facility]  [address], [place] [address], [place], [place]   [place], [place] [phone] Admissions: 8am-3pm M-F  Incentives Substance [place] [address],    [place], [place] [phone]   [facility] [address] [place], [place], [place]   [facility] [address]  [place], [place]   [facility] - Intensive Outpatient [address], [place], [place]   [facility] [place]  [place], [place] [phone] or [phone]   [facility] ([facility]) [address] [place], [place] Accepts Medicaid  [facility] [place] [address],  [place] [phone] Substance Abuse/Addiction Treatment   [facility] Organization         Address  Phone  Notes  [facility]  [phone]   Domenic Schwab, PhD [address] Arlis Port[place], [place]   [phone] or [phone]   [place], [place] [phone]   [facility] [address], [place], [place] [phone]  Insurance/Medicaid/sponsorship through [facility] Families [address] [place] [address]                                    [place], [place] [phone] [place] [address], [place] [phone]    Dr. Adele Schilder  [phone]   [facility] Dept. 1) [address] [place] 2) [place] 3)  [address] [address], [place] [phone]  [phone]   [place] [phone] or [phone] (After Hours)

## 2015-06-24 NOTE — Progress Notes (Signed)
CM spoke with pt who confirms [place] resident with no pcp.  CM discussed and provided written information for uninsured accepting pcps, discussed the importance of pcp vs EDP services for f/u care, www.needymeds.org, www.goodrx.com, discounted pharmacies and other [place] resources such as [facility] , [facility], affordable care act, financial assistance, uninsured dental services, [place] med assist, DSS and  health department  Reviewed resources for [place] uninsured accepting pcps like [facility], family medicine at [address], [facility] [place], [facility], local urgent care centers, [facility], [facility], general medical clinics, family services of the [facility], [facility] urgent care plus others, medication resources, [facility] out patient pharmacies and housing Pt voiced understanding and appreciation of resources provided   Provided [facility] contact information Pt did not agreed to a referral Cm completed referral states she will contact them as needed  Cm reviewed how in detail to use goodrx, needymeds.org and [place] med assist for her htn medication Pt very appreciative

## 2015-06-24 NOTE — ED Notes (Addendum)
Pt states migraine, nasal congestion, emesis, cough, generalized aching, irritated/itching skin since Friday. Productive cough, clear lung sounds in triage.

## 2015-09-13 ENCOUNTER — Encounter (HOSPITAL_COMMUNITY): Payer: Self-pay | Admitting: *Deleted

## 2015-09-13 ENCOUNTER — Emergency Department (HOSPITAL_COMMUNITY)
Admission: EM | Admit: 2015-09-13 | Discharge: 2015-09-13 | Disposition: A | Discharge status: Home / Self Care | Payer: Self-pay | Attending: Emergency Medicine | Admitting: Emergency Medicine

## 2015-09-13 DIAGNOSIS — L989 Disorder of the skin and subcutaneous tissue, unspecified: Secondary | ICD-10-CM

## 2015-09-13 DIAGNOSIS — Z79899 Other long term (current) drug therapy: Secondary | ICD-10-CM | POA: Insufficient documentation

## 2015-09-13 DIAGNOSIS — L708 Other acne: Secondary | ICD-10-CM | POA: Insufficient documentation

## 2015-09-13 DIAGNOSIS — F17210 Nicotine dependence, cigarettes, uncomplicated: Secondary | ICD-10-CM | POA: Insufficient documentation

## 2015-09-13 DIAGNOSIS — I10 Essential (primary) hypertension: Secondary | ICD-10-CM | POA: Insufficient documentation

## 2015-09-13 DIAGNOSIS — R51 Headache: Secondary | ICD-10-CM | POA: Insufficient documentation

## 2015-09-13 LAB — I-STAT CHEM 8, ED
BUN: 6 mg/dL (ref 6–20)
CREATININE: 0.8 mg/dL (ref 0.44–1.00)
Calcium, Ion: 1.14 mmol/L (ref 1.12–1.23)
Chloride: 104 mmol/L (ref 101–111)
Glucose, Bld: 89 mg/dL (ref 65–99)
HEMATOCRIT: 33 % — AB (ref 36.0–46.0)
HEMOGLOBIN: 11.2 g/dL — AB (ref 12.0–15.0)
Potassium: 3.8 mmol/L (ref 3.5–5.1)
SODIUM: 140 mmol/L (ref 135–145)
TCO2: 25 mmol/L (ref 0–100)

## 2015-09-13 MED ORDER — ONDANSETRON 4 MG PO TBDP
4.0000 mg | ORAL_TABLET | Freq: Once | ORAL | Status: AC
  Administered 2015-09-13: 4 mg via ORAL
  Filled 2015-09-13: qty 1

## 2015-09-13 MED ORDER — HYDROCODONE-ACETAMINOPHEN 5-325 MG PO TABS: 1.0000 | ORAL_TABLET | ORAL | Status: DC | PRN

## 2015-09-13 MED ORDER — DOXYCYCLINE HYCLATE 100 MG PO CAPS: 100.0000 mg | ORAL_CAPSULE | Freq: Two times a day (BID) | ORAL | Status: DC

## 2015-09-13 MED ORDER — HYDROCODONE-ACETAMINOPHEN 5-325 MG PO TABS
1.0000 | ORAL_TABLET | Freq: Once | ORAL | Status: AC
  Administered 2015-09-13: 1 via ORAL
  Filled 2015-09-13: qty 1

## 2015-09-13 MED ORDER — AMLODIPINE BESYLATE 5 MG PO TABS
5.0000 mg | ORAL_TABLET | Freq: Once | ORAL | Status: AC
  Administered 2015-09-13: 5 mg via ORAL
  Filled 2015-09-13: qty 1

## 2015-09-13 MED ORDER — AMLODIPINE BESYLATE 5 MG PO TABS: 5.0000 mg | ORAL_TABLET | Freq: Every day | ORAL | Status: DC

## 2015-09-13 MED ORDER — HYDROCHLOROTHIAZIDE 25 MG PO TABS: 25.0000 mg | ORAL_TABLET | Freq: Every day | ORAL | Status: DC

## 2015-09-13 MED ORDER — CLINDAMYCIN PHOSPHATE 1 % EX GEL: Freq: Two times a day (BID) | CUTANEOUS | Status: DC

## 2015-09-13 NOTE — ED Notes (Signed)
Pt ambulating independently w/ steady gait on d/c in no acute distress, A&Ox4. D/c instructions reviewed w/ pt and family - pt and family deny any further questions or concerns at present. Rx given x5  

## 2015-09-13 NOTE — ED Notes (Signed)
Pt w/ hx of ance however has noted a progressively worsening bump to left cheek - pt states area now appears to be abscessed. Denies fever.

## 2015-09-13 NOTE — ED Provider Notes (Signed)
CSN: [id number]     Arrival date & time 09/13/15  1525 History  By signing my name below, I, Tanda Rockers, attest that this documentation has been prepared under the direction and in the presence of Rayn Shorb, [place]. Electronically Signed: Tanda Rockers, ED Scribe. 09/13/2015. 4:00 PM.   No chief complaint on file.  The history is provided by the patient. No language interpreter was used.     HPI Comments: Leslie Golden is a 50 y.o. female who presents to the Emergency Department complaining of gradual onset, constant, area of redness, swelling, and pain to the left cheek x 1 week. Pt states the area started out as a pimple but has grown in size and began draining thick pus last night. Pt reports that she can feel the pain on the inside of her left mouth as well. She also complains of a mild headache. She has been taking Ibuprofen and Advil PM without relief. 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 Denies fever, chills, or any other associated symptoms.   Pt is also notably hypertensive to 198/129 in triage. She denies headache, chest pain, SOB, diaphoresis, visual disturbance. States she used to be on propanolol but has not taken it in months due to finances.   Past Medical History  Diagnosis Date  . Hypertension    Past Surgical History  Procedure Laterality Date  . Eye surgery    . Vaginal wound closure / repair     Family History  Problem Relation Age of Onset  . Heart failure Father     Died of a heart attack  . Heart failure Mother     Diet heart attack   Social History  Substance Use Topics  . Smoking status: Current Every Day Smoker -- .5 years    Types: Cigarettes  . Smokeless tobacco: Not on file  . Alcohol Use: No   OB History    No data available     Review of Systems  Constitutional: Negative for fever and chills.  Skin: Positive for color change.       Swelling and pain to left cheek  with drainage  Neurological: Positive for headaches.  All other systems reviewed and are negative.  Allergies  Review of patient's allergies indicates no known allergies.  Home Medications   Prior to Admission medications   Medication Sig Start Date End Date Taking? Authorizing Provider  benzonatate (TESSALON) 100 MG capsule Take 1 capsule (100 mg total) by mouth every 8 (eight) hours. 06/24/15   Shawn C Joy, PA-C  ferrous sulfate 325 (65 FE) MG tablet Take 1 tablet (325 mg total) by mouth daily. 02/05/15   Tiffany Neva Seat, PA-C  ibuprofen (ADVIL,MOTRIN) 200 MG tablet Take 600 mg by mouth 2 (two) times daily as needed for headache or moderate pain.    Historical Provider, MD  ibuprofen (ADVIL,MOTRIN) 800 MG tablet Take 1 tablet (800 mg total) by mouth 3 (three) times daily. 06/24/15   Shawn C Joy, PA-C  LORazepam (ATIVAN) 1 MG tablet Take 1 tablet (1 mg total) by mouth every 6 (six) hours as needed for anxiety. 09/28/14   Roxy Horseman, PA-C  ondansetron (ZOFRAN ODT) 4 MG disintegrating tablet Take 1 tablet (4 mg total) by mouth every 8 (eight) hours as needed for nausea or vomiting. 06/24/15   Anselm Pancoast, PA-C   Triage Vitals:  BP 198/129 mmHg  Pulse 76  Temp(Src) 98.7 F (37.1 C) (Oral)  Resp 16  SpO2 99%   Physical Exam  Constitutional: She is oriented to person, place, and time. She appears well-developed and well-nourished. No distress.  HENT:  Head: Normocephalic and atraumatic.  Mouth/Throat: Mucous membranes are normal. No oral lesions. No trismus in the jaw. No dental abscesses.  Eyes: Conjunctivae and EOM are normal.  Neck: Neck supple. No tracheal deviation present.  Cardiovascular: Normal rate.   Pulmonary/Chest: Effort normal. No respiratory distress.  Musculoskeletal: Normal range of motion.  Neurological: She is alert and oriented to person, place, and time.  Skin: Skin is warm and dry.  Moderate cystic/inflammatory acne on both cheeks. No truncal acne. ~1cm  fluctuant, tender nodule on left cheek. No active drainage or bleeding. No erythema.   Psychiatric: She has a normal mood and affect. Her behavior is normal.  Nursing note and vitals reviewed.  Filed Vitals:   09/13/15 1547 09/13/15 1744  BP: 198/129 173/122  Pulse: 76 78  Temp: 98.7 F (37.1 C)   TempSrc: Oral   Resp: 16 16  SpO2: 99% 99%     ED Course  Procedures (including critical care time)  DIAGNOSTIC STUDIES: Oxygen Saturation is 99% on RA, normal by my interpretation.    COORDINATION OF CARE: 3:59 PM-Discussed treatment plan which includes Rx antibiotics and antibiotic cream with pt at bedside and pt agreed to plan.   Labs Review Labs Reviewed - No data to display  Imaging Review No results found.   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 Her current lesion appears to be consistent with this. I discussed with pt that given her pain and the larger size of this particular lesion we could attempt i&d, though it is prominently visible on her face and I suspect it will scar. She is otherwise afebrile, not tachycardic or hypotensive, and has no trismus or dysphagia. I discussed with pt that we could trial outpatient treatment of cystic/inflammtory acne with short course of PO antibiotics with topical clindamycin and if current lesions worsens or new symptoms arise to return to the ER. She verbalized agreement with this plan.  Regarding HTN, pt is asymptomatic at this time. Will check istat chem 8 to r/o renal insufficiency. Discussed with pt that I will give her norvasc here and rx for same. Due to finances, if she is unable to afford Norvasc I will also give her a rx for HCTZ to hold on to but would ideally like to try a non-diuretic first.    BP improved. Will give rx for norvasc  but just in case will give HCTZ for her to take instead if norvasc is too expensive. Will d/c as above. ER return precautions given.   Carlene Coria, PA-C 09/13/15 1755  Mancel Bale, MD 09/13/15 [phone]

## 2015-09-13 NOTE — Discharge Instructions (Signed)
You were seen in the ER today for evaluation of a facial abscess. As we discussed, the spot you are concerned about appears to be a flare up of your existing acne. I will give you a short course of oral antibiotics and a prescription for antibiotic ointment. As we discussed, return to the ER for new or worsening symptoms such as enlargement of the area, fever, difficulty swallowing, etc.  Your blood pressure was high today. I will give you a prescription for Norvasc. If this is too expensive, I have also given you a prescription for HCTZ instead.   Take medications as prescribed. Return to the emergency room for worsening condition or new concerning symptoms. Follow up with your regular doctor. If you don't have a regular doctor use one of the numbers below to establish a primary care doctor.   Emergency Department Resource Guide 1) Find a Doctor and Pay Out of Pocket Although you won't have to find out who is covered by your insurance plan, it is a good idea to ask around and get recommendations. You will then need to call the office and see if the doctor you have chosen will accept you as a new patient and what types of options they offer for patients who are self-pay. Some doctors offer discounts or will set up payment plans for their patients who do not have insurance, but you will need to ask so you aren't surprised when you get to your appointment.  2) Contact Your Local Health Department Not all health departments have doctors that can see patients for sick visits, but many do, so it is worth a call to see if yours does. If you don't know where your local health department is, you can check in your phone book. The CDC also has a tool to help you locate your state's health department, and many state websites also have listings of all of their local health departments.  3) Find a Walk-in Clinic If your illness is not likely to be very severe or complicated, you may want to try a walk in clinic.  These are popping up all over the country in pharmacies, drugstores, and shopping centers. They're usually staffed by nurse practitioners or physician assistants that have been trained to treat common illnesses and complaints. They're usually fairly quick and inexpensive. However, if you have serious medical issues or chronic medical problems, these are probably not your best option.  No Primary Care Doctor: - Call Health Connect at  [phone] - they can help you locate a primary care doctor that  accepts your insurance, provides certain services, etc. - Physician Referral [phone]  Emergency Department Resource Guide 1) Find a Doctor and Pay Out of Pocket Although you won't have to find out who is covered by your insurance plan, it is a good idea to ask around and get recommendations. You will then need to call the office and see if the doctor you have chosen will accept you as a new patient and what types of options they offer for patients who are self-pay. Some doctors offer discounts or will set up payment plans for their patients who do not have insurance, but you will need to ask so you aren't surprised when you get to your appointment.  2) Contact Your Local Health Department Not all health departments have doctors that can see patients for sick visits, but many do, so it is worth a call to see if yours does. If you don't know where your  local health department is, you can check in your phone book. The CDC also has a tool to help you locate your state's health department, and many state websites also have listings of all of their local health departments.  3) Find a Walk-in Clinic If your illness is not likely to be very severe or complicated, you may want to try a walk in clinic. These are popping up all over the country in pharmacies, drugstores, and shopping centers. They're usually staffed by nurse practitioners or physician assistants that have been trained to treat common  illnesses and complaints. They're usually fairly quick and inexpensive. However, if you have serious medical issues or chronic medical problems, these are probably not your best option.  No Primary Care Doctor: - Call Health Connect at  [phone] - they can help you locate a primary care doctor that  accepts your insurance, provides certain services, etc. - Physician Referral Service- [phone]  Chronic Pain Problems: Organization         Address  Phone   Notes  [facility]  [phone] Patients need to be referred by their primary care doctor.   Medication Assistance: Organization         Address  Phone   Notes  [facility] Medication [facility] [address] [place] [place], [place] [address] [phone] --Must be a resident of [facility] -- Must have NO insurance coverage whatsoever (no Medicaid/ Medicare, etc.) -- The pt. MUST have a primary care doctor that directs their care regularly and follows them in the community   [facility]  [phone]   [employer]  [phone]    Agencies that provide inexpensive medical care: Organization         Address  Phone   Notes  [facility]  [phone]   [facility]    [phone]   [facility] [address] [place], [place] [address] [phone]   [facility] [address] [place] [place] [phone]   [facility]    [phone]   [facility]    [phone]   [facility]  [address], [place] Phone:  [phone], Fax:  [phone] Hours of Operation:  9 am - 6 pm, M-F.  Also accepts Medicaid/Medicare and self-pay.  [facility]  [address], [place] Phone: [phone], Fax: [phone]. Hours of Operation:  8:30 am - 5:30 pm, M-F.  Also accepts Medicaid and self-pay.  [facility] [place]  [address] [place] Phone: [phone]   [facility] [address] Natasha Bence [place], [place] [phone], Ext. [phone] Mondays & Thursdays: 7-9 AM.  First 15 patients are seen on a first come, first serve basis.    Medicaid-accepting [facility] Providers:  Organization         Address  Phone   Notes  [facility] [address],  [phone] Also accepts self-pay patients.  [facility] [address] Laurell Josephs [place], [place]  [phone]   [facility] [address], [place] [phone]   [facility] [address], [place] [phone]   Renaye Rakers [address], [place]   [phone] Only accepts [place] patients after  they have their name applied to their card.   Self-Pay (no insurance) in [facility]:  Organization         Address  Phone   Notes  Sickle Cell Patients, [facility] [address] [place], [place] [phone]   [facility] [address] [place], [place] [phone]   [facility]  [address], [place] [phone]   [facility]/Dr. Osei-Bonsu  [address], [place] or [address], [place] [phone] Phone number for both [place] and [place] locations is the same.  [facility] [address], [place] [phone]   [facility] [address] [place] or [address] [phone]   [facility] [address] [place] [phone], phone; [phone], fax Sees patients 1st and 3rd Saturday of every month.  Must not qualify for public or private insurance (i.e. Medicaid, Medicare,  Health Choice, Veterans' Benefits)  Household income should be no more than 200% of the  poverty level The clinic cannot treat you if you are pregnant or think you are pregnant  Sexually transmitted diseases are not treated at the clinic.

## 2015-11-28 ENCOUNTER — Encounter (HOSPITAL_COMMUNITY): Payer: Self-pay | Admitting: *Deleted

## 2015-11-28 ENCOUNTER — Emergency Department (HOSPITAL_COMMUNITY): Payer: Self-pay

## 2015-11-28 ENCOUNTER — Inpatient Hospital Stay (HOSPITAL_COMMUNITY)
Admission: EM | Admit: 2015-11-28 | Discharge: 2015-11-30 | DRG: 392 | Disposition: A | Discharge status: Home / Self Care | Payer: Self-pay | Attending: Internal Medicine | Admitting: Internal Medicine

## 2015-11-28 DIAGNOSIS — F17210 Nicotine dependence, cigarettes, uncomplicated: Secondary | ICD-10-CM | POA: Diagnosis present

## 2015-11-28 DIAGNOSIS — F12988 Cannabis use, unspecified with other cannabis-induced disorder: Secondary | ICD-10-CM

## 2015-11-28 DIAGNOSIS — D509 Iron deficiency anemia, unspecified: Secondary | ICD-10-CM | POA: Diagnosis present

## 2015-11-28 DIAGNOSIS — Z720 Tobacco use: Secondary | ICD-10-CM | POA: Diagnosis present

## 2015-11-28 DIAGNOSIS — R64 Cachexia: Secondary | ICD-10-CM | POA: Diagnosis present

## 2015-11-28 DIAGNOSIS — T39395A Adverse effect of other nonsteroidal anti-inflammatory drugs [NSAID], initial encounter: Secondary | ICD-10-CM | POA: Diagnosis present

## 2015-11-28 DIAGNOSIS — K529 Noninfective gastroenteritis and colitis, unspecified: Principal | ICD-10-CM | POA: Diagnosis present

## 2015-11-28 DIAGNOSIS — Z681 Body mass index (BMI) 19 or less, adult: Secondary | ICD-10-CM

## 2015-11-28 DIAGNOSIS — I10 Essential (primary) hypertension: Secondary | ICD-10-CM | POA: Diagnosis present

## 2015-11-28 DIAGNOSIS — N926 Irregular menstruation, unspecified: Secondary | ICD-10-CM | POA: Diagnosis present

## 2015-11-28 DIAGNOSIS — F419 Anxiety disorder, unspecified: Secondary | ICD-10-CM | POA: Diagnosis present

## 2015-11-28 DIAGNOSIS — R112 Nausea with vomiting, unspecified: Secondary | ICD-10-CM

## 2015-11-28 DIAGNOSIS — F1210 Cannabis abuse, uncomplicated: Secondary | ICD-10-CM | POA: Diagnosis present

## 2015-11-28 LAB — CBC
HEMATOCRIT: 27.8 % — AB (ref 36.0–46.0)
Hemoglobin: 8.3 g/dL — ABNORMAL LOW (ref 12.0–15.0)
MCH: 20.8 pg — ABNORMAL LOW (ref 26.0–34.0)
MCHC: 29.9 g/dL — ABNORMAL LOW (ref 30.0–36.0)
MCV: 69.7 fL — ABNORMAL LOW (ref 78.0–100.0)
PLATELETS: 315 10*3/uL (ref 150–400)
RBC: 3.99 MIL/uL (ref 3.87–5.11)
RDW: 19.9 % — AB (ref 11.5–15.5)
WBC: 8.5 10*3/uL (ref 4.0–10.5)

## 2015-11-28 LAB — COMPREHENSIVE METABOLIC PANEL WITH GFR
ALT: 13 U/L — ABNORMAL LOW (ref 14–54)
AST: 24 U/L (ref 15–41)
Albumin: 3.3 g/dL — ABNORMAL LOW (ref 3.5–5.0)
Alkaline Phosphatase: 56 U/L (ref 38–126)
Anion gap: 7 (ref 5–15)
BUN: 12 mg/dL (ref 6–20)
CO2: 24 mmol/L (ref 22–32)
Calcium: 8.6 mg/dL — ABNORMAL LOW (ref 8.9–10.3)
Chloride: 109 mmol/L (ref 101–111)
Creatinine, Ser: 0.94 mg/dL (ref 0.44–1.00)
GFR calc Af Amer: >60 mL/min (ref >60)
GFR calc non Af Amer: >60 mL/min (ref >60)
Glucose, Bld: 90 mg/dL (ref 65–99)
Potassium: 4 mmol/L (ref 3.5–5.1)
Sodium: 140 mmol/L (ref 135–145)
Total Bilirubin: 0.4 mg/dL (ref 0.3–1.2)
Total Protein: 6.6 g/dL (ref 6.5–8.1)

## 2015-11-28 LAB — URINALYSIS, ROUTINE W REFLEX MICROSCOPIC
Bilirubin Urine: NEGATIVE
Glucose, UA: NEGATIVE mg/dL
Hgb urine dipstick: NEGATIVE
KETONES UR: NEGATIVE mg/dL
LEUKOCYTES UA: NEGATIVE
NITRITE: NEGATIVE
PH: 7.5 (ref 5.0–8.0)
PROTEIN: NEGATIVE mg/dL
Specific Gravity, Urine: 1.009 (ref 1.005–1.030)

## 2015-11-28 LAB — RAPID URINE DRUG SCREEN, HOSP PERFORMED
Amphetamines: NOT DETECTED
Barbiturates: NOT DETECTED
Benzodiazepines: NOT DETECTED
Cocaine: NOT DETECTED
Opiates: NOT DETECTED
Tetrahydrocannabinol: POSITIVE — AB

## 2015-11-28 LAB — LIPASE, BLOOD: Lipase: 25 U/L (ref 11–51)

## 2015-11-28 LAB — I-STAT BETA HCG BLOOD, ED (MC, WL, AP ONLY): I-stat hCG, quantitative: <5 m[IU]/mL (ref <5)

## 2015-11-28 MED ORDER — ONDANSETRON HCL 4 MG PO TABS
4.0000 mg | ORAL_TABLET | Freq: Four times a day (QID) | ORAL | Status: DC | PRN
  Administered 2015-11-29: 4 mg via ORAL
  Filled 2015-11-28: qty 1

## 2015-11-28 MED ORDER — METOPROLOL TARTRATE 12.5 MG HALF TABLET
12.5000 mg | ORAL_TABLET | Freq: Two times a day (BID) | ORAL | Status: DC
  Administered 2015-11-29 – 2015-11-30 (×4): 12.5 mg via ORAL
  Filled 2015-11-28 (×4): qty 1

## 2015-11-28 MED ORDER — FENTANYL CITRATE (PF) 100 MCG/2ML IJ SOLN
25.0000 ug | Freq: Once | INTRAMUSCULAR | Status: AC
  Administered 2015-11-28: 25 ug via INTRAVENOUS
  Filled 2015-11-28: qty 2

## 2015-11-28 MED ORDER — SODIUM CHLORIDE 0.9 % IV SOLN
INTRAVENOUS | Status: DC
  Administered 2015-11-29 – 2015-11-30 (×2): 1000 mL via INTRAVENOUS

## 2015-11-28 MED ORDER — NICOTINE 14 MG/24HR TD PT24
14.0000 mg | MEDICATED_PATCH | Freq: Every day | TRANSDERMAL | Status: DC
  Filled 2015-11-28 (×3): qty 1

## 2015-11-28 MED ORDER — OXYCODONE-ACETAMINOPHEN 5-325 MG PO TABS
ORAL_TABLET | ORAL | Status: AC
  Filled 2015-11-28: qty 1

## 2015-11-28 MED ORDER — SODIUM CHLORIDE 0.9 % IV BOLUS (SEPSIS): 500.0000 mL | Freq: Once | INTRAVENOUS | Status: DC

## 2015-11-28 MED ORDER — METOPROLOL TARTRATE 1 MG/ML IV SOLN
5.0000 mg | Freq: Once | INTRAVENOUS | Status: AC
  Administered 2015-11-28: 5 mg via INTRAVENOUS
  Filled 2015-11-28: qty 5

## 2015-11-28 MED ORDER — SODIUM CHLORIDE 0.9 % IV BOLUS (SEPSIS)
500.0000 mL | Freq: Once | INTRAVENOUS | Status: AC
  Administered 2015-11-28: 500 mL via INTRAVENOUS

## 2015-11-28 MED ORDER — OXYCODONE-ACETAMINOPHEN 5-325 MG PO TABS
1.0000 | ORAL_TABLET | ORAL | Status: AC | PRN
  Administered 2015-11-28 – 2015-11-29 (×2): 1 via ORAL
  Filled 2015-11-28: qty 1

## 2015-11-28 MED ORDER — AMLODIPINE BESYLATE 5 MG PO TABS
5.0000 mg | ORAL_TABLET | Freq: Every day | ORAL | Status: DC
  Administered 2015-11-29 – 2015-11-30 (×3): 5 mg via ORAL
  Filled 2015-11-28 (×3): qty 1

## 2015-11-28 MED ORDER — SODIUM CHLORIDE 0.9% FLUSH
3.0000 mL | Freq: Two times a day (BID) | INTRAVENOUS | Status: DC
  Administered 2015-11-29: 3 mL via INTRAVENOUS

## 2015-11-28 MED ORDER — PANTOPRAZOLE SODIUM 40 MG IV SOLR: 40.0000 mg | INTRAVENOUS | Status: DC

## 2015-11-28 MED ORDER — ONDANSETRON HCL 4 MG/2ML IJ SOLN
4.0000 mg | Freq: Four times a day (QID) | INTRAMUSCULAR | Status: DC | PRN
  Administered 2015-11-29 – 2015-11-30 (×3): 4 mg via INTRAVENOUS
  Filled 2015-11-28 (×3): qty 2

## 2015-11-28 MED ORDER — LORAZEPAM 2 MG/ML IJ SOLN
1.0000 mg | Freq: Once | INTRAMUSCULAR | Status: AC
  Administered 2015-11-28: 1 mg via INTRAVENOUS
  Filled 2015-11-28: qty 1

## 2015-11-28 MED ORDER — PANTOPRAZOLE SODIUM 40 MG IV SOLR
40.0000 mg | Freq: Once | INTRAVENOUS | Status: AC
  Administered 2015-11-28: 40 mg via INTRAVENOUS
  Filled 2015-11-28: qty 40

## 2015-11-28 NOTE — ED Notes (Signed)
Pt met by nurse and security outside and told she needed to take the IV out of her arm if she was leaving and she stated she just went to smoke.  Pt is on the way back to room at this time.  Pod E RN made aware of same.

## 2015-11-28 NOTE — ED Notes (Addendum)
Pt unhooked herself from cardiac monitor and this emt went to check on pt, Pt requesting to go outside and smoke, this emt informed pt that since she is a pt that she would be discharged if she left. Pt requesting to speak with the doctor at this time. Pt also included that she had seen "A hospital employee wheel a random white pt to the smoking section outside and smoked and wheeled her back in" Pt informed that our hospital is a smoke free campus.Callie/Emilie RN Aware.

## 2015-11-28 NOTE — ED Provider Notes (Signed)
CSN: [id number]     Arrival date & time 11/28/15  1207 History   First MD Initiated Contact with Patient 11/28/15 1652     Chief Complaint  Patient presents with  . Abdominal Pain  . Emesis      HPI  Expand All Collapse All   Pt c/o generalized abd pain onset x 5 worse in the umbilical area, pt reports burning sensation in the stomach, pt reports x 4 vomiting episodes & x3 diarrhea episodes, A&O x4.Patient admits to heavy marijuana abuse.  Has been admitted for in a retractable vomiting in the past.          Past Medical History  Diagnosis Date  . Hypertension    Past Surgical History  Procedure Laterality Date  . Eye surgery    . Vaginal wound closure / repair     Family History  Problem Relation Age of Onset  . CAD Father   . Hypertension Father   . Hypertension Mother   . Hypertension Sister    Social History  Substance Use Topics  . Smoking status: Current Every Day Smoker -- 0.25 packs/day for .5 years    Types: Cigarettes  . Smokeless tobacco: None  . Alcohol Use: No   OB History    No data available     Review of Systems  Constitutional: Negative for fever and chills.  Gastrointestinal: Positive for nausea and vomiting.      Allergies  Review of patient's allergies indicates no known allergies.  Home Medications   Prior to Admission medications   Medication Sig Start Date End Date Taking? Authorizing Provider  amLODipine (NORVASC) 5 MG tablet Take 1 tablet (5 mg total) by mouth daily. 09/13/15  Yes Ace Gins Sam, PA-C  ibuprofen (ADVIL,MOTRIN) 800 MG tablet Take 1 tablet (800 mg total) by mouth 3 (three) times daily. 06/24/15  Yes Shawn C Joy, PA-C  benzonatate (TESSALON) 100 MG capsule Take 1 capsule (100 mg total) by mouth every 8 (eight) hours. Patient not taking: Reported on 09/13/2015 06/24/15   Shawn C Joy, PA-C  clindamycin (CLINDAGEL) 1 % gel Apply topically 2 (two) times daily. 09/13/15   Ace Gins Sam, PA-C  doxycycline (VIBRAMYCIN) 100 MG  capsule Take 1 capsule (100 mg total) by mouth 2 (two) times daily. 09/13/15   Ace Gins Sam, PA-C  ferrous sulfate 325 (65 FE) MG tablet Take 1 tablet (325 mg total) by mouth daily. Patient not taking: Reported on 09/13/2015 02/05/15   Marlon Pel, PA-C  hydrochlorothiazide (HYDRODIURIL) 25 MG tablet Take 1 tablet (25 mg total) by mouth daily. 09/13/15   Carlene Coria, PA-C  HYDROcodone-acetaminophen (NORCO/VICODIN) 5-325 MG tablet Take 1 tablet by mouth every 4 (four) hours as needed. 09/13/15   Ace Gins Sam, PA-C  LORazepam (ATIVAN) 1 MG tablet Take 1 tablet (1 mg total) by mouth every 6 (six) hours as needed for anxiety. Patient not taking: Reported on 09/13/2015 09/28/14   Roxy Horseman, PA-C  ondansetron (ZOFRAN ODT) 4 MG disintegrating tablet Take 1 tablet (4 mg total) by mouth every 8 (eight) hours as needed for nausea or vomiting. Patient not taking: Reported on 09/13/2015 06/24/15   Shawn C Joy, PA-C   BP 174/103 mmHg  Pulse 65  Temp(Src) 98.1 F (36.7 C) (Oral)  Resp 13  Ht  (1.676 m)  Wt 113 lb (51.256 kg)  BMI 18.25 kg/m2  SpO2 100%  LMP 11/11/2015 Physical Exam  Constitutional: She is oriented to person, place,  and time. She appears well-developed and well-nourished. No distress.  HENT:  Head: Normocephalic and atraumatic.  Eyes: Pupils are equal, round, and reactive to light.  Neck: Normal range of motion.  Cardiovascular: Normal rate and intact distal pulses.   Pulmonary/Chest: No respiratory distress.  Abdominal: Normal appearance and bowel sounds are normal. She exhibits no distension. There is tenderness in the epigastric area. There is no rigidity, no rebound and no guarding.  Musculoskeletal: Normal range of motion.  Neurological: She is alert and oriented to person, place, and time. No cranial nerve deficit.  Skin: Skin is warm and dry. No rash noted.  Psychiatric: She has a normal mood and affect. Her behavior is normal.  Nursing note and vitals  reviewed.   ED Course  Procedures (including critical care time) Medications  oxyCODONE-acetaminophen (PERCOCET/ROXICET) 5-325 MG per tablet 1 tablet (1 tablet Oral Given 11/28/15 1632)  oxyCODONE-acetaminophen (PERCOCET/ROXICET) 5-325 MG per tablet (not administered)  metoprolol (LOPRESSOR) injection 5 mg (not administered)  metoprolol (LOPRESSOR) injection 5 mg (5 mg Intravenous Given 11/28/15 1749)  sodium chloride 0.9 % bolus 500 mL (0 mLs Intravenous Stopped 11/28/15 1924)  LORazepam (ATIVAN) injection 1 mg (1 mg Intravenous Given 11/28/15 1910)  fentaNYL (SUBLIMAZE) injection 25 mcg (25 mcg Intravenous Given 11/28/15 1911)  pantoprazole (PROTONIX) injection 40 mg (40 mg Intravenous Given 11/28/15 2002)    Labs Review Labs Reviewed  COMPREHENSIVE METABOLIC PANEL - Abnormal; Notable for the following:    Calcium 8.6 (*)    Albumin 3.3 (*)    ALT 13 (*)    All other components within normal limits  CBC - Abnormal; Notable for the following:    Hemoglobin 8.3 (*)    HCT 27.8 (*)    MCV 69.7 (*)    MCH 20.8 (*)    MCHC 29.9 (*)    RDW 19.9 (*)    All other components within normal limits  URINE RAPID DRUG SCREEN, HOSP PERFORMED - Abnormal; Notable for the following:    Tetrahydrocannabinol POSITIVE (*)    All other components within normal limits  LIPASE, BLOOD  URINALYSIS, ROUTINE W REFLEX MICROSCOPIC (NOT AT [facility])  I-STAT BETA HCG BLOOD, ED ([facility], [facility], [facility] ONLY)    Imaging Review Dg Abd Acute W/chest  11/28/2015  CLINICAL DATA:  Productive cough for 2 days. Night sweats. Emesis today. EXAM: DG ABDOMEN ACUTE W/ 1V CHEST COMPARISON:  06/24/2015 FINDINGS: There is no evidence of dilated bowel loops or free intraperitoneal air. No radiopaque calculi or other significant radiographic abnormality is seen. Heart size and mediastinal contours are within normal limits. Moderate unchanged hyperinflation. The lungs are clear. The pulmonary vasculature is normal. IMPRESSION: Negative abdominal  radiographs.  No acute cardiopulmonary disease. Electronically Signed   By: Ellery Plunkaniel R Mitchell M.D.   On: 11/28/2015 20:03   I have personally reviewed and evaluated these images and lab results as part of my medical decision-making.    MDM   Final diagnoses:  Cannabinoid hyperemesis syndrome (HCC)        Nelva Nayobert Lydia Meng, MD 11/28/15 2202

## 2015-11-28 NOTE — H&P (Signed)
[facility] History and Physical  Leslie Golden EAV:[id number] DOB: 1965/10/24 DOA: 11/28/2015  Referring physician: Nelva Nay, M.D. PCP: Kathreen Cosier, MD   Chief Complaint: Abdominal pain.  HPI: Leslie Golden is a 50 y.o. female with a past medical history hypertension, iron deficiency anemia, depression, anxiety, tobacco abuse disorder, cannabis use disorder who comes into the emergency department due to abdominal pain, diarrhea, nausea and vomiting since Sunday.  Per patient, on Sunday she had several episodes of diarrhea, nausea and emesis. This was followed by burning abdominal pain for which she was taking ibuprofen, which she states worsen her pain. She denies fever, chills, sick contacts or travel history. She thinks that this was caused by the consumption of a sausage patty. She denies urinary symptoms.   Patient initial blood pressure in the emergency department was 191/106 mmHg. She is supposed to take amlodipine daily, but has not taken this medication in a while. She denies chest pain, palpitations, dizziness, diaphoresis, pitting edema of the lower extremities.  Patient smokes tobacco cigarettes and she uses Cannabis frequently. She says that she smokes 2 Cannabis cigarettes a day, which she stays she uses to manage her depression and anxiety.   Workup in the ER is significant for a positive urine toxicology for THC and anemia.   Review of Systems:  Constitutional:  No weight loss, night sweats, Fevers, chills, fatigue.  HEENT:  No headaches, Difficulty swallowing,Tooth/dental problems,Sore throat,  No sneezing, itching, ear ache, nasal congestion, post nasal drip,  Cardio-vascular:  No chest pain, Orthopnea, PND, swelling in lower extremities, anasarca, dizziness, palpitations  GI:  As above mentioned. Resp:  No shortness of breath with exertion or at rest. No excess mucus, no productive cough, No non-productive cough, No coughing up of blood.No change in  color of mucus.No wheezing.No chest wall deformity  Skin:  no rash or lesions.  GU:  no dysuria, change in color of urine, no urgency or frequency. No flank pain.  Musculoskeletal:  No joint pain or swelling. No decreased range of motion. No back pain.  Psych:  No change in mood or affect. No depression or anxiety. No memory loss.   Past Medical History  Diagnosis Date  . Hypertension    Past Surgical History  Procedure Laterality Date  . Eye surgery    . Vaginal wound closure / repair     Social History:  reports that she has been smoking Cigarettes.  She has a .125 pack-year smoking history. She does not have any smokeless tobacco history on file. She reports that she uses illicit drugs (Marijuana). She reports that she does not drink alcohol.  No Known Allergies  Family History  Problem Relation Age of Onset  . CAD Father   . Hypertension Father   . Hypertension Mother   . Hypertension Sister      Prior to Admission medications   Medication Sig Start Date End Date Taking? Authorizing Provider  amLODipine (NORVASC) 5 MG tablet Take 1 tablet (5 mg total) by mouth daily. 09/13/15  Yes Ace Gins Sam, PA-C  ibuprofen (ADVIL,MOTRIN) 800 MG tablet Take 1 tablet (800 mg total) by mouth 3 (three) times daily. 06/24/15  Yes Shawn C Joy, PA-C  benzonatate (TESSALON) 100 MG capsule Take 1 capsule (100 mg total) by mouth every 8 (eight) hours. Patient not taking: Reported on 09/13/2015 06/24/15   Shawn C Joy, PA-C  clindamycin (CLINDAGEL) 1 % gel Apply topically 2 (two) times daily. 09/13/15   Ace Gins  Sam, PA-C  doxycycline (VIBRAMYCIN) 100 MG capsule Take 1 capsule (100 mg total) by mouth 2 (two) times daily. 09/13/15   Ace Gins Sam, PA-C  ferrous sulfate 325 (65 FE) MG tablet Take 1 tablet (325 mg total) by mouth daily. Patient not taking: Reported on 09/13/2015 02/05/15   Marlon Pel, PA-C  hydrochlorothiazide (HYDRODIURIL) 25 MG tablet Take 1 tablet (25 mg total) by mouth daily.  09/13/15   Carlene Coria, PA-C  HYDROcodone-acetaminophen (NORCO/VICODIN) 5-325 MG tablet Take 1 tablet by mouth every 4 (four) hours as needed. 09/13/15   Ace Gins Sam, PA-C  LORazepam (ATIVAN) 1 MG tablet Take 1 tablet (1 mg total) by mouth every 6 (six) hours as needed for anxiety. Patient not taking: Reported on 09/13/2015 09/28/14   Roxy Horseman, PA-C  ondansetron (ZOFRAN ODT) 4 MG disintegrating tablet Take 1 tablet (4 mg total) by mouth every 8 (eight) hours as needed for nausea or vomiting. Patient not taking: Reported on 09/13/2015 06/24/15   Anselm Pancoast, PA-C   Physical Exam: Filed Vitals:   11/28/15 1848 11/28/15 1915 11/28/15 2045 11/28/15 2215  BP: 191/106 158/111 174/103 187/109  Pulse: 77 69 65 66  Temp:      TempSrc:      Resp: Height:      Weight:      SpO2: 100% 100% 100% 100%    Wt Readings from Last 3 Encounters:  11/28/15 51.256 kg (113 lb)  08/19/14 49.896 kg (110 lb)  07/27/13 55.747 kg (122 lb 14.4 oz)    General:  Appears calm and comfortable Eyes: PERRL, normal lids, irises & conjunctiva ENT: grossly normal hearing, lips & tongue Neck: no LAD, masses or thyromegaly Cardiovascular: RRR, no m/r/g. No LE edema. Telemetry: SR, no arrhythmias  Respiratory: CTA bilaterally, no w/r/r. Normal respiratory effort. Abdomen: Bowel sounds positive, soft, + epigastric tenderness, no guarding, no rebound. Skin: no rash or induration seen on limited exam Musculoskeletal: grossly normal tone BUE/BLE Psychiatric: grossly normal mood and affect, speech fluent and appropriate Neurologic: Awake, alert, oriented 4, grossly non-focal.          Labs on Admission:  Basic Metabolic Panel:  Recent Labs Lab 11/28/15 1300  NA 140  K 4.0  CL 109  CO2 24  GLUCOSE 90  BUN 12  CREATININE 0.94  CALCIUM 8.6*   Liver Function Tests:  Recent Labs Lab 11/28/15 1300  AST 24  ALT 13*  ALKPHOS 56  BILITOT 0.4  PROT 6.6  ALBUMIN 3.3*    Recent Labs Lab  11/28/15 1300  LIPASE 25   CBC:  Recent Labs Lab 11/28/15 1300  WBC 8.5  HGB 8.3*  HCT 27.8*  MCV 69.7*  PLT 315    Radiological Exams on Admission: Dg Abd Acute W/chest  11/28/2015  CLINICAL DATA:  Productive cough for 2 days. Night sweats. Emesis today. EXAM: DG ABDOMEN ACUTE W/ 1V CHEST COMPARISON:  06/24/2015 FINDINGS: There is no evidence of dilated bowel loops or free intraperitoneal air. No radiopaque calculi or other significant radiographic abnormality is seen. Heart size and mediastinal contours are within normal limits. Moderate unchanged hyperinflation. The lungs are clear. The pulmonary vasculature is normal. IMPRESSION: Negative abdominal radiographs.  No acute cardiopulmonary disease. Electronically Signed   By: Ellery Plunk M.D.   On: 11/28/2015 20:03    EKG: Independently reviewed EKG: Independently reviewed. Vent. rate 63 BPM PR interval 177 ms QRS duration 82 ms QT/QTc 492/504 ms P-R-T  axes 88 27 74 Sinus rhythm Nonspecific repol abnormality, diffuse leads Minimal ST elevation, inferior leads  Assessment/Plan Principal Problem:   Acute gastroenteritis Admit to telemetry. Gentle IV hydration. Pantoprazole IV. Clear liquids diet.  Active Problems:   HTN (hypertension) Resume amlodipine. Monitor blood pressure.    Tobacco use Nicotine replacement therapy ordered.    Anxiety disorder   Cannabis abuse Advised to consider ceasing its use and follow-up with behavioral health.    Iron deficiency anemia Check anemia profile.   Code Status: Full code. DVT Prophylaxis: SCDs. Family Communication:  Disposition Plan: Admit for IV hydration and blood pressure control.  Time spent: About 70 minutes were used in the presence of this admission.  Bobette Moavid Manuel Ortiz, MD [facility] Pager [phone].

## 2015-11-28 NOTE — ED Notes (Addendum)
Received call from nurse first that patient had made her way out to smoke via wheelchair, IV still in place. Encouraged to call security. PA&MD made aware.

## 2015-11-28 NOTE — Care Management (Signed)
ED CM reviewed patient's record. Patient has had 3 ED visits within the past 6 months. No PCP or health insurance listed. [facility] at [facility]  for follow up and to establish care. Information placed on AVS.

## 2015-11-28 NOTE — ED Notes (Signed)
Pt c/o generalized abd pain onset x 5 worse in the umbilical area, pt reports burning sensation in the stomach, pt reports x 4 vomiting episodes & x3 diarrhea episodes, A&O x4

## 2015-11-28 NOTE — ED Notes (Signed)
Patient transported to X-ray 

## 2015-11-28 NOTE — ED Notes (Signed)
Pt seen being pushed onside by friend.  Nurse in [facility] called and advised that pt was in waiting room and headed out the door.  Security called and also advised of situation.

## 2015-11-29 ENCOUNTER — Inpatient Hospital Stay: Payer: Self-pay

## 2015-11-29 DIAGNOSIS — D509 Iron deficiency anemia, unspecified: Secondary | ICD-10-CM

## 2015-11-29 DIAGNOSIS — I10 Essential (primary) hypertension: Secondary | ICD-10-CM

## 2015-11-29 DIAGNOSIS — K529 Noninfective gastroenteritis and colitis, unspecified: Principal | ICD-10-CM

## 2015-11-29 DIAGNOSIS — Z720 Tobacco use: Secondary | ICD-10-CM

## 2015-11-29 LAB — RETICULOCYTES
RBC.: 3.58 MIL/uL — AB (ref 3.87–5.11)
RETIC COUNT ABSOLUTE: 43 10*3/uL (ref 19.0–186.0)
Retic Ct Pct: 1.2 % (ref 0.4–3.1)

## 2015-11-29 LAB — IRON AND TIBC
IRON: 12 ug/dL — AB (ref 28–170)
Saturation Ratios: 3 % — ABNORMAL LOW (ref 10.4–31.8)
TIBC: 440 ug/dL (ref 250–450)
UIBC: 428 ug/dL

## 2015-11-29 LAB — CBC WITH DIFFERENTIAL/PLATELET
Basophils Absolute: 0 10*3/uL (ref 0.0–0.1)
Basophils Relative: 0 %
EOS PCT: 3 %
Eosinophils Absolute: 0.3 10*3/uL (ref 0.0–0.7)
HEMATOCRIT: 25 % — AB (ref 36.0–46.0)
HEMOGLOBIN: 7.8 g/dL — AB (ref 12.0–15.0)
LYMPHS ABS: 3 10*3/uL (ref 0.7–4.0)
Lymphocytes Relative: 34 %
MCH: 21.8 pg — AB (ref 26.0–34.0)
MCHC: 31.2 g/dL (ref 30.0–36.0)
MCV: 69.8 fL — AB (ref 78.0–100.0)
Monocytes Absolute: 0.5 10*3/uL (ref 0.1–1.0)
Monocytes Relative: 6 %
NEUTROS ABS: 5.1 10*3/uL (ref 1.7–7.7)
Neutrophils Relative %: 57 %
Platelets: 268 10*3/uL (ref 150–400)
RBC: 3.58 MIL/uL — ABNORMAL LOW (ref 3.87–5.11)
RDW: 20.2 % — ABNORMAL HIGH (ref 11.5–15.5)
WBC: 8.9 10*3/uL (ref 4.0–10.5)

## 2015-11-29 LAB — COMPREHENSIVE METABOLIC PANEL
ALK PHOS: 56 U/L (ref 38–126)
ALT: 11 U/L — AB (ref 14–54)
ANION GAP: 9 (ref 5–15)
AST: 20 U/L (ref 15–41)
Albumin: 3.3 g/dL — ABNORMAL LOW (ref 3.5–5.0)
BILIRUBIN TOTAL: 0.4 mg/dL (ref 0.3–1.2)
BUN: 9 mg/dL (ref 6–20)
CALCIUM: 8.1 mg/dL — AB (ref 8.9–10.3)
CO2: 22 mmol/L (ref 22–32)
CREATININE: 0.82 mg/dL (ref 0.44–1.00)
Chloride: 105 mmol/L (ref 101–111)
GFR calc non Af Amer: >60 mL/min (ref >60)
GLUCOSE: 90 mg/dL (ref 65–99)
Potassium: 3.6 mmol/L (ref 3.5–5.1)
Sodium: 136 mmol/L (ref 135–145)
TOTAL PROTEIN: 6.4 g/dL — AB (ref 6.5–8.1)

## 2015-11-29 LAB — VITAMIN B12: Vitamin B-12: 369 pg/mL (ref 180–914)

## 2015-11-29 LAB — FOLATE: FOLATE: 12.9 ng/mL (ref >5.9)

## 2015-11-29 LAB — FERRITIN: FERRITIN: 7 ng/mL — AB (ref 11–307)

## 2015-11-29 MED ORDER — LORAZEPAM 2 MG/ML IJ SOLN
1.0000 mg | INTRAMUSCULAR | Status: DC | PRN
  Administered 2015-11-29 – 2015-11-30 (×3): 1 mg via INTRAVENOUS
  Filled 2015-11-29 (×3): qty 1

## 2015-11-29 MED ORDER — MORPHINE SULFATE (PF) 2 MG/ML IV SOLN
2.0000 mg | Freq: Once | INTRAVENOUS | Status: AC
  Administered 2015-11-29: 2 mg via INTRAVENOUS
  Filled 2015-11-29: qty 1

## 2015-11-29 MED ORDER — SUCRALFATE 1 GM/10ML PO SUSP
1.0000 g | Freq: Three times a day (TID) | ORAL | Status: DC
  Administered 2015-11-29 – 2015-11-30 (×4): 1 g via ORAL
  Filled 2015-11-29 (×5): qty 10

## 2015-11-29 MED ORDER — HYDRALAZINE HCL 25 MG PO TABS
25.0000 mg | ORAL_TABLET | Freq: Three times a day (TID) | ORAL | Status: DC
  Administered 2015-11-29 – 2015-11-30 (×4): 25 mg via ORAL
  Filled 2015-11-29 (×4): qty 1

## 2015-11-29 MED ORDER — ACETAMINOPHEN 325 MG PO TABS: 650.0000 mg | ORAL_TABLET | Freq: Four times a day (QID) | ORAL | Status: DC | PRN

## 2015-11-29 MED ORDER — SODIUM CHLORIDE 0.9 % IV SOLN
510.0000 mg | Freq: Once | INTRAVENOUS | Status: AC
  Administered 2015-11-29: 510 mg via INTRAVENOUS
  Filled 2015-11-29: qty 17

## 2015-11-29 MED ORDER — TRAMADOL HCL 50 MG PO TABS
50.0000 mg | ORAL_TABLET | Freq: Four times a day (QID) | ORAL | Status: DC | PRN
  Administered 2015-11-29 – 2015-11-30 (×5): 50 mg via ORAL
  Filled 2015-11-29 (×5): qty 1

## 2015-11-29 MED ORDER — FERROUS SULFATE 325 (65 FE) MG PO TABS
325.0000 mg | ORAL_TABLET | Freq: Every day | ORAL | Status: DC
  Administered 2015-11-30: 325 mg via ORAL
  Filled 2015-11-29: qty 1

## 2015-11-29 MED ORDER — PANTOPRAZOLE SODIUM 40 MG IV SOLR
40.0000 mg | Freq: Two times a day (BID) | INTRAVENOUS | Status: DC
  Administered 2015-11-29 – 2015-11-30 (×3): 40 mg via INTRAVENOUS
  Filled 2015-11-29 (×3): qty 40

## 2015-11-29 MED ORDER — GI COCKTAIL ~~LOC~~
30.0000 mL | Freq: Once | ORAL | Status: AC
  Administered 2015-11-29: 30 mL via ORAL
  Filled 2015-11-29: qty 30

## 2015-11-29 MED ORDER — ENSURE ENLIVE PO LIQD
237.0000 mL | Freq: Two times a day (BID) | ORAL | Status: DC
  Administered 2015-11-29 – 2015-11-30 (×2): 237 mL via ORAL

## 2015-11-29 NOTE — Progress Notes (Signed)
11/29/2015 10:56 AM  Spoke to patient regarding care concerns and assisted in service recovery.   PACCAR Incyanne Hill BSN, RN-BC, [employer] [facility] Phone [phone]

## 2015-11-29 NOTE — Progress Notes (Signed)
Initial Nutrition Assessment  DOCUMENTATION CODES:   Underweight  INTERVENTION:  Provide Ensure Enlive po BID, each supplement provides 350 kcal and 20 grams of protein.  Encourage adequate PO intake.   NUTRITION DIAGNOSIS:   Inadequate oral intake related to poor appetite as evidenced by per patient/family report  GOAL:   Patient will meet greater than or equal to 90% of their needs  MONITOR:   PO intake, Supplement acceptance, Weight trends, Labs, I & O's  REASON FOR ASSESSMENT:    (Low BMI)    ASSESSMENT:   50 y.o. female with a past medical history hypertension, iron deficiency anemia, depression, anxiety, tobacco abuse disorder, cannabis use disorder who comes into the emergency department due to abdominal pain, diarrhea, nausea and vomiting since Sunday.  Diet has just been advanced to a soft diet. Pt reports appetite has been improving and she is ready for her solid food at lunch. Pt reports poor po intake with little to no po over the past 2-3 days (since Wednesday). Pt reports prior to current sickness, she usually eats well with at least 3 meals a day. Usual body weight reported to be ~125 lbs. Pt with a reported 12% weight loss from usual body weight. No recent weight recorded over the past year, thus unable to determine accuracy of weight. Pt reports she has consumed Ensure in the past, however mostly recently has stopped drinking them. RD to order Ensure for current admission to aid in caloric and protein needs. Pt encouraged to eat her food at meals.   Nutrition-Focused physical exam completed. Findings are no fat depletion, moderate muscle depletion, and no edema.   Labs and medications reviewed.   Diet Order:  DIET SOFT Room service appropriate?: Yes; Fluid consistency:: Thin  Skin:  Reviewed, no issues  Last BM:  Unknown  Height:   Ht Readings from Last 1 Encounters:  11/28/15 5\' 6"  (1.676 m)    Weight:   Wt Readings from Last 1 Encounters:   11/28/15 110 lb 10.7 oz (50.2 kg)    Ideal Body Weight:  59 kg  BMI:  Body mass index is 17.87 kg/(m^2).  Estimated Nutritional Needs:   Kcal:  1700-1900  Protein:  75-85 grams  Fluid:  1.7 - 1.9 L/day  EDUCATION NEEDS:   No education needs identified at this time  Leslie SmilingStephanie Juanita Streight, MS, RD, LDN Pager # [phone] After hours/ weekend pager # [phone]

## 2015-11-29 NOTE — Progress Notes (Signed)
[facility] PROGRESS NOTE  VIRDIA ZIESMER ZOX:[id number] DOB: Aug 18, 1966 DOA: 11/28/2015 PCP: No primary care provider on file.  Brief Summary  Leslie Golden is a 50 y.o. female with a past medical history hypertension, iron deficiency anemia, depression, anxiety, tobacco abuse disorder, cannabis use disorder who comes into the emergency department due to abdominal pain, diarrhea, nausea and vomiting since Sunday.  Workup in the ER is significant for a positive urine toxicology for THC and anemia.  Assessment/Plan  Acute gastroenteritis and I suspect some antibiotic or NSAID-induced esophagitis/gastritis -  Advance diet -  Increase to PPI BID -  Start carafate and trial of GI cocktail -  Tylenol with ultram for breakthrough symptoms -  Avoid NSAIDS   HTN (hypertension) -  Continue amlodipine -  Add hydralazine -  Continue metoprolol   Tobacco use Nicotine replacement therapy ordered.   Anxiety disorder  Cannabis abuse Advised to consider ceasing its use and follow-up with behavioral health.   Iron deficiency anemia -  Give feraheme and resume oral iron  Diet:  soft Access:  PIV IVF:  Yes  Proph:  lovenox  Code Status: full Family Communication: patient alone Disposition Plan: pending tolerating a diet   Consultants:  none  Procedures:  none  Antibiotics:  none   HPI/Subjective:  Ongoing epigastric discomfort. She continues to have some nausea and vomiting but has not had a bowel movement since yesterday morning.    Objective: Filed Vitals:   11/28/15 2230 11/28/15 2353 11/29/15 0638 11/29/15 0812  BP: 180/108 189/110 185/106 165/94  Pulse: 64 64 58 59  Temp:  98.4 F (36.9 C) 98.6 F (37 C) 97.5 F (36.4 C)  TempSrc:   Oral Oral  Resp: Height:   (1.676 m)    Weight:  50.2 kg (110 lb 10.7 oz)    SpO2: 100% 100% 100% 100%    Intake/Output Summary (Last 24 hours) at 11/29/15 1724 Last data filed at 11/29/15 1410  Gross  per 24 hour  Intake 1183.33 ml  Output      0 ml  Net 1183.33 ml   Filed Weights   11/28/15 1246 11/28/15 2353  Weight: 51.256 kg (113 lb) 50.2 kg (110 lb 10.7 oz)   Body mass index is 17.87 kg/(m^2).  Exam:   General:  Cachectic appearing adult female, No acute distress  HEENT:  NCAT, MMM  Cardiovascular:  RRR, nl S1, S2 no mrg, 2+ pulses, warm extremities  Respiratory:  CTAB, no increased WOB  Abdomen:   NABS, soft, nondistended, mild tenderness in epigastric area without rebound or guarding  MSK:   Normal tone and bulk, no LEE  Neuro:  Grossly intact  Data Reviewed: Basic Metabolic Panel:  Recent Labs Lab 11/28/15 1300 11/29/15 0541  NA 140 136  K 4.0 3.6  CL 109 105  CO2 24 22  GLUCOSE 90 90  BUN 12 9  CREATININE 0.94 0.82  CALCIUM 8.6* 8.1*   Liver Function Tests:  Recent Labs Lab 11/28/15 1300 11/29/15 0541  AST 24 20  ALT 13* 11*  ALKPHOS 56 56  BILITOT 0.4 0.4  PROT 6.6 6.4*  ALBUMIN 3.3* 3.3*    Recent Labs Lab 11/28/15 1300  LIPASE 25   No results for input(s): AMMONIA in the last 168 hours. CBC:  Recent Labs Lab 11/28/15 1300 11/29/15 0541  WBC 8.5 8.9  NEUTROABS  --  5.1  HGB 8.3* 7.8*  HCT 27.8* 25.0*  MCV 69.7* 69.8*  PLT 315 268    No results found for this or any previous visit (from the past 240 hour(s)).   Studies: Dg Abd Acute W/chest  11/28/2015  CLINICAL DATA:  Productive cough for 2 days. Night sweats. Emesis today. EXAM: DG ABDOMEN ACUTE W/ 1V CHEST COMPARISON:  06/24/2015 FINDINGS: There is no evidence of dilated bowel loops or free intraperitoneal air. No radiopaque calculi or other significant radiographic abnormality is seen. Heart size and mediastinal contours are within normal limits. Moderate unchanged hyperinflation. The lungs are clear. The pulmonary vasculature is normal. IMPRESSION: Negative abdominal radiographs.  No acute cardiopulmonary disease. Electronically Signed   By: Ellery Plunkaniel R Mitchell M.D.    On: 11/28/2015 20:03    Scheduled Meds: . amLODipine  5 mg Oral Daily  . feeding supplement (ENSURE ENLIVE)  237 mL Oral BID BM  . [START ON 11/30/2015] ferrous sulfate  325 mg Oral Q breakfast  . hydrALAZINE  25 mg Oral TID  . metoprolol tartrate  12.5 mg Oral BID  . nicotine  14 mg Transdermal Daily  . pantoprazole (PROTONIX) IV  40 mg Intravenous BID  . sodium chloride flush  3 mL Intravenous Q12H  . sucralfate  1 g Oral TID WC & HS   Continuous Infusions: . sodium chloride 1,000 mL (11/29/15 0016)    Principal Problem:   Acute gastroenteritis Active Problems:   HTN (hypertension)   Tobacco use   Anxiety disorder   Cannabis abuse   Iron deficiency anemia    Time spent: 30 min    Johnie Stadel, [facility]  [facility] Pager [phone]. If 7PM-7AM, please contact night-coverage at [email or web address], password [email or web address] 11/29/2015, 5:24 PM  LOS: 0 days

## 2015-11-29 NOTE — Care Management Note (Signed)
Case Management Note  Patient Details  Name: Leslie Golden MRN: [id number] Date of Birth: 07/13/1966  Subjective/Objective:          CM following for progression and d/c planning.          Action/Plan: 11/29/2015 Noted that ED CM had [facility], November 29, 2015 @ 4pm. This CM notified [facility] of pt inpt status and attempted to schedule another appointment however [facility] have no [facility] available at this time. Will attempt to reschedule on Monday.  Expected Discharge Date:  11/29/15               Expected Discharge Plan:  Home/Self Care  In-House Referral:  Clinical Social Work  Discharge planning Services  CM Consult, Medication Assistance  Post Acute Care Choice:    Choice offered to:     DME Arranged:    DME Agency:     HH Arranged:    HH Agency:     Status of Service:  In process, will continue to follow  Medicare Important Message Given:    Date Medicare IM Given:    Medicare IM give by:    Date Additional Medicare IM Given:    Additional Medicare Important Message give by:     If discussed at Long Length of Stay Meetings, dates discussed:    Additional Comments:  Starlyn SkeansRoyal, Jennessa Trigo U, RN 11/29/2015, 3:40 PM

## 2015-11-30 DIAGNOSIS — F1210 Cannabis abuse, uncomplicated: Secondary | ICD-10-CM

## 2015-11-30 DIAGNOSIS — F411 Generalized anxiety disorder: Secondary | ICD-10-CM

## 2015-11-30 LAB — COMPREHENSIVE METABOLIC PANEL
ALBUMIN: 3.4 g/dL — AB (ref 3.5–5.0)
ALK PHOS: 56 U/L (ref 38–126)
ALT: 12 U/L — AB (ref 14–54)
ANION GAP: 9 (ref 5–15)
AST: 22 U/L (ref 15–41)
BILIRUBIN TOTAL: 0.7 mg/dL (ref 0.3–1.2)
BUN: 7 mg/dL (ref 6–20)
CALCIUM: 8.8 mg/dL — AB (ref 8.9–10.3)
CO2: 22 mmol/L (ref 22–32)
CREATININE: 0.9 mg/dL (ref 0.44–1.00)
Chloride: 105 mmol/L (ref 101–111)
GFR calc Af Amer: >60 mL/min (ref >60)
GFR calc non Af Amer: >60 mL/min (ref >60)
GLUCOSE: 86 mg/dL (ref 65–99)
Potassium: 4.2 mmol/L (ref 3.5–5.1)
Sodium: 136 mmol/L (ref 135–145)
TOTAL PROTEIN: 6.9 g/dL (ref 6.5–8.1)

## 2015-11-30 LAB — CBC WITH DIFFERENTIAL/PLATELET
Basophils Absolute: 0 10*3/uL (ref 0.0–0.1)
Basophils Relative: 0 %
Eosinophils Absolute: 0.2 10*3/uL (ref 0.0–0.7)
Eosinophils Relative: 3 %
HEMATOCRIT: 27.8 % — AB (ref 36.0–46.0)
HEMOGLOBIN: 8.3 g/dL — AB (ref 12.0–15.0)
LYMPHS ABS: 2.7 10*3/uL (ref 0.7–4.0)
Lymphocytes Relative: 33 %
MCH: 20.8 pg — AB (ref 26.0–34.0)
MCHC: 29.9 g/dL — AB (ref 30.0–36.0)
MCV: 69.5 fL — ABNORMAL LOW (ref 78.0–100.0)
MONOS PCT: 8 %
Monocytes Absolute: 0.6 10*3/uL (ref 0.1–1.0)
NEUTROS ABS: 4.5 10*3/uL (ref 1.7–7.7)
NEUTROS PCT: 56 %
Platelets: 313 10*3/uL (ref 150–400)
RBC: 4 MIL/uL (ref 3.87–5.11)
RDW: 19.9 % — ABNORMAL HIGH (ref 11.5–15.5)
WBC: 8 10*3/uL (ref 4.0–10.5)

## 2015-11-30 MED ORDER — TRAMADOL HCL 50 MG PO TABS: 50.0000 mg | ORAL_TABLET | Freq: Four times a day (QID) | ORAL | Status: DC | PRN

## 2015-11-30 MED ORDER — FERROUS SULFATE 325 (65 FE) MG PO TABS: 325.0000 mg | ORAL_TABLET | Freq: Two times a day (BID) | ORAL | Status: DC

## 2015-11-30 MED ORDER — PANTOPRAZOLE SODIUM 40 MG PO TBEC: 40.0000 mg | DELAYED_RELEASE_TABLET | Freq: Two times a day (BID) | ORAL | Status: DC

## 2015-11-30 MED ORDER — LORAZEPAM 1 MG PO TABS: 1.0000 mg | ORAL_TABLET | Freq: Four times a day (QID) | ORAL | Status: DC | PRN

## 2015-11-30 MED ORDER — SUCRALFATE 1 GM/10ML PO SUSP: 1.0000 g | Freq: Three times a day (TID) | ORAL | Status: DC

## 2015-11-30 MED ORDER — LISINOPRIL 10 MG PO TABS: 10.0000 mg | ORAL_TABLET | Freq: Every day | ORAL | Status: DC

## 2015-11-30 MED ORDER — CARVEDILOL 25 MG PO TABS: 25.0000 mg | ORAL_TABLET | Freq: Two times a day (BID) | ORAL | Status: DC

## 2015-11-30 MED ORDER — AMLODIPINE BESYLATE 5 MG PO TABS: 5.0000 mg | ORAL_TABLET | Freq: Every day | ORAL | Status: DC

## 2015-11-30 MED ORDER — ONDANSETRON 4 MG PO TBDP: 4.0000 mg | ORAL_TABLET | Freq: Three times a day (TID) | ORAL | Status: DC | PRN

## 2015-11-30 NOTE — Care Management (Signed)
CM spoke with patient concerning follow up and establishing PCP. Patient confirmed being without health insurance and PCP. [facility]. HFU scheduled on Monday 4/10 at 2pm, provided patient with clinic information.  Information placed on AVS, Teach back done patient verbalized No further questions or concerns verbalized. No further CM  needs  Identified.

## 2015-11-30 NOTE — Progress Notes (Signed)
   11/30/2015   To Whom It May Concern,  Leslie Golden was [facility] from 11/28/2015 to 11/30/2015.  She may return to work without restrictions on 12/02/15.    Sincerely,    Renae FickleMacKenzie Izela Altier, MD [facility] [address] [place], [place]  [id number]  Ph:    [phone] Fax:  [phone]

## 2015-11-30 NOTE — Discharge Summary (Signed)
Physician Discharge Summary  Leslie Golden AVW:[id number] DOB: 04-Mar-1966 DOA: 11/28/2015  PCP: No primary care provider on file.  Admit date: 11/28/2015 Discharge date: 11/30/2015  Recommendations for Outpatient Follow-up:  1. Follow up with primary care doctor regarding in 2 weeks for anemia, gastritis, and hypertension  Discharge Diagnoses:  Principal Problem:   Acute gastroenteritis Active Problems:   HTN (hypertension)   Tobacco use   Anxiety disorder   Cannabis abuse   Iron deficiency anemia   Discharge Condition: stable, improved  Diet recommendation: soft, bland  Wt Readings from Last 3 Encounters:  11/30/15 51.5 kg (113 lb 8.6 oz)  08/19/14 49.896 kg (110 lb)  07/27/13 55.747 kg (122 lb 14.4 oz)    History of present illness:  Leslie Golden is a 50 y.o. female with a past medical history hypertension, iron deficiency anemia, depression, anxiety, tobacco abuse disorder, cannabis use disorder who comes into the emergency department due to abdominal pain, diarrhea, nausea and vomiting since Sunday. Workup in the ER is significant for a positive urine toxicology for THC and anemia.  Hospital Course:   Acute gastroenteritis and may have some superimposed NSAID-induced esophagitis/gastritis.  She was started on IV PPI with carafate and GI cocktail and was given tylenol and ultram for pain.  With IVF and antiemetics, she was able to tolerate a bland diet.  Her vomiting decreased and she had no further diarrhea after admission.  She was advised to avoid NSAIDs and follow up with her primary care doctor in a few weeks to make sure her symptoms have fully resolved.  DDx includes cyclic vomiting from [facility] use or IBS given her history of anxiety.      HTN (hypertension), started on norvasc, carvedilol, and lisinopril.   Tobacco use, counseled cessation and advised nicotine patches when she was ready to quit.   Anxiety disorder, given as needed benzodiazepines during hospitalization  [facility]  Advised to talk to her primary care doctor about starting SSRI or SNRI.     Cannabis abuse, advised to consider ceasing its use and follow-up with behavioral health.   Iron deficiency anemia, hemoglobin stable around /dl.  Given feraheme and resumed oral iron.  Did not require blood transfusion  Irregular menses, advised to talk to her primary care doctor.  Consultants:  none  Procedures:  none  Antibiotics:  none  Discharge Exam: Filed Vitals:   11/30/15 0607 11/30/15 0906  BP: 159/88 166/99  Pulse: 61 67  Temp: 98.6 F (37 C) 98.6 F (37 C)  Resp: 18 18   Filed Vitals:   11/29/15 0812 11/29/15 2112 11/30/15 0607 11/30/15 0906  BP: 165/94 165/91 159/88 166/99  Pulse: 59 58 61 67  Temp: 97.5 F (36.4 C) 98.8 F (37.1 C) 98.6 F (37 C) 98.6 F (37 C)  TempSrc: Oral Oral Oral Oral  Resp: Height:      Weight:   51.5 kg (113 lb 8.6 oz)   SpO2: 100% 100% 100% 100%    General: Cachectic appearing adult female, No acute distress  HEENT: NCAT, MMM  Cardiovascular: RRR, nl S1, S2 no mrg, 2+ pulses, warm extremities  Respiratory: CTAB, no increased WOB  Abdomen: NABS, soft, nondistended, mild tenderness in epigastric area without rebound or guarding  MSK: Normal tone and bulk, no LEE  Neuro: Grossly intact  Discharge Instructions      Discharge Instructions    Call MD for:  difficulty breathing, headache or visual disturbances    Complete by:  As directed      Call MD for:  extreme fatigue    Complete by:  As directed      Call MD for:  persistant dizziness or light-headedness    Complete by:  As directed      Call MD for:  persistant nausea and vomiting    Complete by:  As directed      Call MD for:  severe uncontrolled pain    Complete by:  As directed      Call MD for:  temperature >100.4    Complete by:  As directed      Diet - low sodium heart healthy    Complete by:   As directed      Discharge instructions    Complete by:  As directed   Please eat bland foods.  I have given new prescriptions for your nausea and anxiety medications.  Please talk to your primary care doctor about your anxiety.  I have also prescribed several medications for blood pressure and resumed your iron.  Your doctor will need to check your blood pressure and check your blood work again at your next visit.     Increase activity slowly    Complete by:  As directed             Medication List    STOP taking these medications        benzonatate 100 MG capsule  Commonly known as:  TESSALON     clindamycin 1 % gel  Commonly known as:  CLINDAGEL     doxycycline 100 MG capsule  Commonly known as:  VIBRAMYCIN     hydrochlorothiazide 25 MG tablet  Commonly known as:  HYDRODIURIL     HYDROcodone-acetaminophen 5-325 MG tablet  Commonly known as:  NORCO/VICODIN     ibuprofen 800 MG tablet  Commonly known as:  ADVIL,MOTRIN      TAKE these medications        amLODipine 5 MG tablet  Commonly known as:  NORVASC  Take 1 tablet (5 mg total) by mouth daily.     carvedilol 25 MG tablet  Commonly known as:  COREG  Take 1 tablet (25 mg total) by mouth 2 (two) times daily with a meal.     ferrous sulfate 325 (65 FE) MG tablet  Take 1 tablet (325 mg total) by mouth 2 (two) times daily with a meal.     lisinopril 10 MG tablet  Commonly known as:  PRINIVIL,ZESTRIL  Take 1 tablet (10 mg total) by mouth daily.     LORazepam 1 MG tablet  Commonly known as:  ATIVAN  Take 1 tablet (1 mg total) by mouth every 6 (six) hours as needed for anxiety.     ondansetron 4 MG disintegrating tablet  Commonly known as:  ZOFRAN ODT  Take 1 tablet (4 mg total) by mouth every 8 (eight) hours as needed for nausea or vomiting.     pantoprazole 40 MG tablet  Commonly known as:  PROTONIX  Take 1 tablet (40 mg total) by mouth 2 (two) times daily before a meal.     sucralfate 1 GM/10ML suspension   Commonly known as:  CARAFATE  Take 10 mLs (1 g total) by mouth 4 (four) times daily -  with meals and at bedtime.     traMADol 50 MG tablet  Commonly known as:  ULTRAM  Take 1 tablet (  50 mg total) by mouth every 6 (six) hours as needed for severe pain.       Follow-up Information    [facility] On 11/29/2015.   Why:  Appointment scheduled for follow up and to establ[facility]ih [facility] Friday 4/7 at 4pm. Any new Prescriiption can be filled at the clinic's pharmacy at this time as well   Contact information:   [address] [facility]  [place] [id number] [phone]       The results of significant diagnostics from this hospitalization (including imaging, microbiology, ancillary and laboratory) are listed below for reference.    Significant Diagnostic Studies: Dg Abd Acute W/chest  11/28/2015  CLINICAL DATA:  Productive cough for 2 days. Night sweats. Emesis today. EXAM: DG ABDOMEN ACUTE W/ 1V CHEST COMPARISON:  06/24/2015 FINDINGS: There is no evidence of dilated bowel loops or free intraperitoneal air. No radiopaque calculi or other significant radiographic abnormality is seen. Heart size and mediastinal contours are within normal limits. Moderate unchanged hyperinflation. The lungs are clear. The pulmonary vasculature is normal. IMPRESSION: Negative abdominal radiographs.  No acute cardiopulmonary disease. Electronically Signed   By: Ellery Plunkaniel R Mitchell M.D.   On: 11/28/2015 20:03    Microbiology: No results found for this or any previous visit (from the past 240 hour(s)).   Labs: Basic Metabolic Panel:  Recent Labs Lab 11/28/15 1300 11/29/15 0541 11/30/15 0621  NA 140 136 136  K 4.0 3.6 4.2  CL 109 105 105  CO2 24 22 22   GLUCOSE 90 90 86  BUN 12 9 7   CREATININE 0.94 0.82 0.90  CALCIUM 8.6* 8.1* 8.8*   Liver Function Tests:  Recent Labs Lab 11/28/15 1300 11/29/15 0541  11/30/15 0621  AST 24 20 22   ALT 13* 11* 12*  ALKPHOS 56 56 56  BILITOT 0.4 0.4 0.7  PROT 6.6 6.4* 6.9  ALBUMIN 3.3* 3.3* 3.4*    Recent Labs Lab 11/28/15 1300  LIPASE 25   No results for input(s): AMMONIA in the last 168 hours. CBC:  Recent Labs Lab 11/28/15 1300 11/29/15 0541 11/30/15 0621  WBC 8.5 8.9 8.0  NEUTROABS  --  5.1 4.5  HGB 8.3* 7.8* 8.3*  HCT 27.8* 25.0* 27.8*  MCV 69.7* 69.8* 69.5*  PLT 315 268 313   Cardiac Enzymes: No results for input(s): CKTOTAL, CKMB, CKMBINDEX, TROPONINI in the last 168 hours. BNP: BNP (last 3 results) No results for input(s): BNP in the last 8760 hours.  ProBNP (last 3 results) No results for input(s): PROBNP in the last 8760 hours.  CBG: No results for input(s): GLUCAP in the last 168 hours.  Time coordinating discharge: 35 minutes  Signed:  Heran Campau  [facility] 11/30/2015, 12:20 PM

## 2015-11-30 NOTE — Progress Notes (Signed)
Patient discharge teaching given, including activity, diet, follow-up appoints, and medications. Patient verbalized understanding of all discharge instructions. IV access was d/c'd. Vitals are stable. Skin is intact except as charted in most recent assessments. Pt to be escorted out by NT, to be driven home by family.  Orlando Thalmann, MBA, BSN, RN 

## 2015-12-02 ENCOUNTER — Inpatient Hospital Stay: Payer: Self-pay

## 2015-12-04 ENCOUNTER — Encounter: Payer: Self-pay | Admitting: Internal Medicine

## 2015-12-04 ENCOUNTER — Ambulatory Visit: Payer: Self-pay | Attending: Internal Medicine | Admitting: Internal Medicine

## 2015-12-04 VITALS — BP 121/85 | HR 70 | Temp 98.1°F | Resp 18 | Ht 66.0 in | Wt 112.4 lb

## 2015-12-04 DIAGNOSIS — Z8249 Family history of ischemic heart disease and other diseases of the circulatory system: Secondary | ICD-10-CM | POA: Insufficient documentation

## 2015-12-04 DIAGNOSIS — F17200 Nicotine dependence, unspecified, uncomplicated: Secondary | ICD-10-CM | POA: Insufficient documentation

## 2015-12-04 DIAGNOSIS — F1299 Cannabis use, unspecified with unspecified cannabis-induced disorder: Secondary | ICD-10-CM | POA: Insufficient documentation

## 2015-12-04 DIAGNOSIS — D649 Anemia, unspecified: Secondary | ICD-10-CM | POA: Insufficient documentation

## 2015-12-04 DIAGNOSIS — D509 Iron deficiency anemia, unspecified: Secondary | ICD-10-CM

## 2015-12-04 DIAGNOSIS — R1011 Right upper quadrant pain: Secondary | ICD-10-CM

## 2015-12-04 DIAGNOSIS — I10 Essential (primary) hypertension: Secondary | ICD-10-CM | POA: Insufficient documentation

## 2015-12-04 DIAGNOSIS — Z79899 Other long term (current) drug therapy: Secondary | ICD-10-CM | POA: Insufficient documentation

## 2015-12-04 DIAGNOSIS — Z79891 Long term (current) use of opiate analgesic: Secondary | ICD-10-CM | POA: Insufficient documentation

## 2015-12-04 MED ORDER — FERROUS SULFATE 325 (65 FE) MG PO TABS: 325.0000 mg | ORAL_TABLET | Freq: Two times a day (BID) | ORAL | Status: DC

## 2015-12-04 NOTE — Progress Notes (Signed)
Patient is here for HFU for Abdominal Pain  Patient complains of right upper abdominal pain being present currently scaled at an 8. Pain is described as contractions and is constant.  Patient was unable to take medications this morning due to vomiting. Patient has eaten once today.  Patient states he cycle last for 2/3 weeks with spotting after a full cycle and clots.  Patient experienced clots this morning after water douching.  [facility] note-  Pt went to hospital and discharged 4/8 She has had abdominal pain since 4/2 She also admits to weight loss but she cannot quantify recent weight  She continues to have intermittent RUQ pain- cramps for 20 minutes and resolves spontaneously. Not necessarily related to food intake but she does admit that food causes her stomach to burn.She has some n/v but is able to maintain fluid and food intake.  Anemia- she says she has "always" been anemic. Has a regular menstrual cycle but bleeds a lot. Also has intermittent spotting between her menstrual cycles Reviewed labs- clearly iron deficient.  Past Medical History  Diagnosis Date  . Hypertension     Social History   Social History  . Marital Status: Single    Spouse Name: N/A  . Number of Children: N/A  . Years of Education: N/A   Occupational History  . Not on file.   Social History Main Topics  . Smoking status: Current Every Day Smoker -- 0.25 packs/day for .5 years    Types: Cigarettes  . Smokeless tobacco: Not on file  . Alcohol Use: No  . Drug Use: Yes    Special: Marijuana  . Sexual Activity: Not Currently   Other Topics Concern  . 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 Eye surgery    . Vaginal wound closure / repair      Family History   Problem Relation Age of Onset  . CAD Father   . Hypertension Father   . Hypertension Mother   . Hypertension Sister     No Known Allergies  Current Outpatient Prescriptions on File Prior to Visit  Medication Sig Dispense Refill  . amLODipine (NORVASC) 5 MG tablet Take 1 tablet (5 mg total) by mouth daily. 30 tablet 0  . carvedilol (COREG) 25 MG tablet Take 1 tablet (25 mg total) by mouth 2 (two) times daily with a meal. 60 tablet 0  . ferrous sulfate 325 (65 FE) MG tablet Take 1 tablet (325 mg total) by mouth 2 (two) times daily with a meal. 60 tablet 0  . lisinopril (PRINIVIL,ZESTRIL) 10 MG tablet Take 1 tablet (10 mg total) by mouth daily. 30 tablet 0  . LORazepam (ATIVAN) 1 MG tablet Take 1 tablet (1 mg total) by mouth every 6 (six) hours as needed for anxiety. 7 tablet 0  . ondansetron (ZOFRAN ODT) 4 MG disintegrating tablet Take 1 tablet (4 mg total) by mouth every 8 (eight) hours as needed for nausea or vomiting. 20 tablet 0  . traMADol (ULTRAM) 50 MG tablet Take 1 tablet (50 mg total) by mouth every 6 (six) hours as needed for severe pain. 20 tablet 0  . pantoprazole (PROTONIX) 40  MG tablet Take 1 tablet (40 mg total) by mouth 2 (two) times daily before a meal. (Patient not taking: Reported on 12/04/2015) 60 tablet 0  . sucralfate (CARAFATE) 1 GM/10ML suspension Take 10 mLs (1 g total) by mouth 4 (four) times daily -  with meals and at bedtime. (Patient not taking: Reported on 12/04/2015) 420 mL 0   No current facility-administered medications on file prior to visit.   i have reviewed meds- she is prescribed all of the above meds but only takes intermittently.   patient denies chest pain, shortness of breath, orthopnea. Denies lower extremity edema, abdominal pain, change in appetite, change in bowel movements. Patient denies rashes, musculoskeletal complaints. No other specific complaints in a complete review of systems.   BP 121/85 mmHg  Pulse 70  Temp(Src) 98.1 F (36.7 C)  (Oral)  Resp 18  Ht  (1.676 m)  Wt 112 lb 6.4 oz (50.984 kg)  BMI 18.15 kg/m2  SpO2 100%  LMP 11/30/2015   NAD-  Pale conjunctiva Chest-dta cv- reg rate abd- soft, nt nd No masses No guarding + BS  extr- no edema.  A/p abdominlal pain- location and description are consistent with GB disease. Will check ultrasound  Anemia- likely menstrual related- i don't think abd pain is related. Refer GYN Start FeSO4. Side efects discussed

## 2015-12-06 ENCOUNTER — Ambulatory Visit (HOSPITAL_COMMUNITY): Payer: Self-pay

## 2015-12-13 ENCOUNTER — Ambulatory Visit (HOSPITAL_COMMUNITY): Payer: MEDICAID

## 2016-02-19 ENCOUNTER — Encounter: Payer: Self-pay | Admitting: Obstetrics & Gynecology

## 2016-04-07 ENCOUNTER — Emergency Department (HOSPITAL_COMMUNITY): Payer: Self-pay

## 2016-04-07 ENCOUNTER — Encounter (HOSPITAL_COMMUNITY): Payer: Self-pay | Admitting: Emergency Medicine

## 2016-04-07 ENCOUNTER — Emergency Department (HOSPITAL_COMMUNITY)
Admission: EM | Admit: 2016-04-07 | Discharge: 2016-04-07 | Disposition: A | Discharge status: Home / Self Care | Payer: Self-pay | Attending: Emergency Medicine | Admitting: Emergency Medicine

## 2016-04-07 DIAGNOSIS — F1290 Cannabis use, unspecified, uncomplicated: Secondary | ICD-10-CM | POA: Insufficient documentation

## 2016-04-07 DIAGNOSIS — R0602 Shortness of breath: Secondary | ICD-10-CM | POA: Insufficient documentation

## 2016-04-07 DIAGNOSIS — I10 Essential (primary) hypertension: Secondary | ICD-10-CM | POA: Insufficient documentation

## 2016-04-07 DIAGNOSIS — Z79899 Other long term (current) drug therapy: Secondary | ICD-10-CM | POA: Insufficient documentation

## 2016-04-07 DIAGNOSIS — R002 Palpitations: Secondary | ICD-10-CM | POA: Insufficient documentation

## 2016-04-07 DIAGNOSIS — F17210 Nicotine dependence, cigarettes, uncomplicated: Secondary | ICD-10-CM | POA: Insufficient documentation

## 2016-04-07 LAB — URINALYSIS, ROUTINE W REFLEX MICROSCOPIC
BILIRUBIN URINE: NEGATIVE
Glucose, UA: NEGATIVE mg/dL
HGB URINE DIPSTICK: NEGATIVE
Ketones, ur: NEGATIVE mg/dL
Leukocytes, UA: NEGATIVE
NITRITE: NEGATIVE
PROTEIN: NEGATIVE mg/dL
SPECIFIC GRAVITY, URINE: 1.005 (ref 1.005–1.030)
pH: 6.5 (ref 5.0–8.0)

## 2016-04-07 LAB — I-STAT TROPONIN, ED: Troponin i, poc: 0.01 ng/mL (ref 0.00–0.08)

## 2016-04-07 LAB — RAPID URINE DRUG SCREEN, HOSP PERFORMED
AMPHETAMINES: NOT DETECTED
BENZODIAZEPINES: NOT DETECTED
Barbiturates: NOT DETECTED
Cocaine: NOT DETECTED
OPIATES: NOT DETECTED
Tetrahydrocannabinol: POSITIVE — AB

## 2016-04-07 LAB — BASIC METABOLIC PANEL
Anion gap: 5 (ref 5–15)
BUN: 11 mg/dL (ref 6–20)
CHLORIDE: 106 mmol/L (ref 101–111)
CO2: 27 mmol/L (ref 22–32)
Calcium: 8.4 mg/dL — ABNORMAL LOW (ref 8.9–10.3)
Creatinine, Ser: 1.14 mg/dL — ABNORMAL HIGH (ref 0.44–1.00)
GFR calc non Af Amer: 56 mL/min — ABNORMAL LOW (ref >60)
Glucose, Bld: 97 mg/dL (ref 65–99)
POTASSIUM: 3.5 mmol/L (ref 3.5–5.1)
SODIUM: 138 mmol/L (ref 135–145)

## 2016-04-07 LAB — CBC
HEMATOCRIT: 29.4 % — AB (ref 36.0–46.0)
Hemoglobin: 9.6 g/dL — ABNORMAL LOW (ref 12.0–15.0)
MCH: 26.6 pg (ref 26.0–34.0)
MCHC: 32.7 g/dL (ref 30.0–36.0)
MCV: 81.4 fL (ref 78.0–100.0)
Platelets: 264 10*3/uL (ref 150–400)
RBC: 3.61 MIL/uL — AB (ref 3.87–5.11)
RDW: 16.7 % — ABNORMAL HIGH (ref 11.5–15.5)
WBC: 12.6 10*3/uL — AB (ref 4.0–10.5)

## 2016-04-07 LAB — POC URINE PREG, ED: PREG TEST UR: NEGATIVE

## 2016-04-07 LAB — MAGNESIUM: MAGNESIUM: 1.7 mg/dL (ref 1.7–2.4)

## 2016-04-07 MED ORDER — SODIUM CHLORIDE 0.9 % IV BOLUS (SEPSIS)
1000.0000 mL | Freq: Once | INTRAVENOUS | Status: AC
  Administered 2016-04-07: 1000 mL via INTRAVENOUS

## 2016-04-07 MED ORDER — CHLORDIAZEPOXIDE HCL 25 MG PO CAPS
50.0000 mg | ORAL_CAPSULE | Freq: Once | ORAL | Status: AC
  Administered 2016-04-07: 50 mg via ORAL
  Filled 2016-04-07: qty 2

## 2016-04-07 MED ORDER — KETOROLAC TROMETHAMINE 30 MG/ML IJ SOLN
30.0000 mg | Freq: Once | INTRAMUSCULAR | Status: AC
  Administered 2016-04-07: 30 mg via INTRAVENOUS
  Filled 2016-04-07: qty 1

## 2016-04-07 MED ORDER — POTASSIUM CHLORIDE CRYS ER 20 MEQ PO TBCR
40.0000 meq | EXTENDED_RELEASE_TABLET | Freq: Once | ORAL | Status: AC
  Administered 2016-04-07: 40 meq via ORAL
  Filled 2016-04-07: qty 2

## 2016-04-07 MED ORDER — MAGNESIUM SULFATE 2 GM/50ML IV SOLN
2.0000 g | Freq: Once | INTRAVENOUS | Status: AC
  Administered 2016-04-07: 2 g via INTRAVENOUS
  Filled 2016-04-07: qty 50

## 2016-04-07 NOTE — ED Notes (Signed)
Brought from home via EMS.  Pt states she woke up at 2230 with feelings of her heart racing, dry mouth, and rapid difficult breathing.  Pt states it felt as if she had been running.  States it has happened in the past but it hasn't lasted this long and has centralized chest pain increasing with palpation.  In route, respirations were WNL and non-labored.  Also complains of some tingling in her legs.

## 2016-04-07 NOTE — ED Notes (Signed)
Bed: [id number] Expected date:  Expected time:  Means of arrival:  Comments: COPD exacerbation

## 2016-04-07 NOTE — ED Triage Notes (Signed)
Pt non compliant with meds since April.  States they "make her feel bad".

## 2016-04-07 NOTE — ED Provider Notes (Signed)
[facility]-EMERGENCY DEPT Provider Note   CSN: [id number] Arrival date & time: 04/07/16  0200  By signing my name below, I, Rosario AdieWilliam Andrew Hiatt, attest that this documentation has been prepared under the direction and in the presence of Tomasita CrumbleAdeleke Jina Olenick, MD. Electronically Signed: Rosario AdieWilliam Andrew Hiatt, ED Scribe. 04/07/16. 3:21 AM.  History   Chief Complaint Chief Complaint  Patient presents with  . Chest Pain  . Shortness of Breath   The history is provided by the patient. No language interpreter was used.     HPI Comments: Leslie Golden is a 50 y.o. female with a PMhx of HTN, anemia, and anxiety, who presents to the Emergency Department complaining of sudden onset, unchanged, intermittent episodes of SOB with associated chest pressure and palpitations x ~6 hours. Pt additionally reports that her left leg was tingling in the ambulance en route. Per pt, she woke up ~6 hours ago with her first episode and it "felt like an anxiety attack". During the episode she notes having SOB, palpations, dry mouth, and heavy chest pressure. At that time she states that she walked outside to calm her symptoms, and as they resolved she would try to lay down again, which she states would bring back her symptoms. Her chest pain is exacerbated w/ deep inspirations. No sick contact with similar symptoms. Prior to her episodes, she reports that she was at baseline. Denies vomiting, diarrhea, or any other associated symptoms.   Past Medical History:  Diagnosis Date  . Hypertension    Patient Active Problem List   Diagnosis Date Noted  . Iron deficiency anemia 11/28/2015  . Anxiety disorder 07/30/2013  . Marijuana dependence (HCC) 07/25/2012  . HTN (hypertension) 07/22/2012  . Substance induced mood disorder (HCC) 07/22/2012  . Tobacco use 07/22/2012   Past Surgical History:  Procedure Laterality Date  . EYE SURGERY    . VAGINAL WOUND CLOSURE / REPAIR     OB History    No data available     Home Medications     Prior to Admission medications   Medication Sig Start Date End Date Taking? Authorizing Provider  amLODipine (NORVASC) 5 MG tablet Take 1 tablet (5 mg total) by mouth daily. Patient not taking: Reported on 04/07/2016 11/30/15   Renae FickleMackenzie Short, MD  carvedilol (COREG) 25 MG tablet Take 1 tablet (25 mg total) by mouth 2 (two) times daily with a meal. Patient not taking: Reported on 04/07/2016 11/30/15   Renae FickleMackenzie Short, MD  ferrous sulfate 325 (65 FE) MG tablet Take 1 tablet (325 mg total) by mouth 2 (two) times daily with a meal. Patient not taking: Reported on 04/07/2016 12/04/15   Lindley MagnusBruce H Swords, MD  lisinopril (PRINIVIL,ZESTRIL) 10 MG tablet Take 1 tablet (10 mg total) by mouth daily. Patient not taking: Reported on 04/07/2016 11/30/15   Renae FickleMackenzie Short, MD  LORazepam (ATIVAN) 1 MG tablet Take 1 tablet (1 mg total) by mouth every 6 (six) hours as needed for anxiety. Patient not taking: Reported on 04/07/2016 11/30/15   Renae FickleMackenzie Short, MD  ondansetron (ZOFRAN ODT) 4 MG disintegrating tablet Take 1 tablet (4 mg total) by mouth every 8 (eight) hours as needed for nausea or vomiting. Patient not taking: Reported on 04/07/2016 11/30/15   Renae FickleMackenzie Short, MD  pantoprazole (PROTONIX) 40 MG tablet Take 1 tablet (40 mg total) by mouth 2 (two) times daily before a meal. Patient not taking: Reported on 12/04/2015 11/30/15   Renae FickleMackenzie Short, MD  sucralfate (CARAFATE) 1 GM/10ML suspension Take 10 mLs (  1 g total) by mouth 4 (four) times daily -  with meals and at bedtime. Patient not taking: Reported on 12/04/2015 11/30/15   Renae Fickle, MD  traMADol (ULTRAM) 50 MG tablet Take 1 tablet (50 mg total) by mouth every 6 (six) hours as needed for severe pain. Patient not taking: Reported on 04/07/2016 11/30/15   Renae Fickle, MD   Family History Family History  Problem Relation Age of Onset  . CAD Father   . Hypertension Father   . Hypertension Mother   . Hypertension Sister    Social History Social History    Substance Use Topics  . Smoking status: Current Every Day Smoker    Packs/day: 0.50    Types: Cigarettes  . Smokeless tobacco: Never Used  . Alcohol use No   Allergies   Review of patient's allergies indicates no known allergies.  Review of Systems Review of Systems 10 Systems reviewed and all are negative for acute change except as noted in the HPI.  Physical Exam Updated Vital Signs BP 183/93 (BP Location: Left Arm)   Pulse 64   Temp 97.9 F (36.6 C) (Oral)   Resp 14   Ht 5\' 6"  (1.676 m)   Wt 120 lb (54.4 kg)   LMP 02/21/2016   SpO2 100%   BMI 19.37 kg/m   Physical Exam  Constitutional: She is oriented to person, place, and time. She appears well-developed and well-nourished. No distress.  HENT:  Head: Normocephalic and atraumatic.  Nose: Nose normal.  Mouth/Throat: Oropharynx is clear and moist. No oropharyngeal exudate.  Eyes: Conjunctivae and EOM are normal. Pupils are equal, round, and reactive to light. No scleral icterus.  Neck: Normal range of motion. Neck supple. No JVD present. No tracheal deviation present. No thyromegaly present.  Cardiovascular: Normal rate, regular rhythm and normal heart sounds.  Exam reveals no gallop and no friction rub.   No murmur heard. Pulmonary/Chest: Effort normal and breath sounds normal. No respiratory distress. She has no wheezes. She exhibits no tenderness.  Abdominal: Soft. Bowel sounds are normal. She exhibits no distension and no mass. There is no tenderness. There is no rebound and no guarding.  Musculoskeletal: Normal range of motion. She exhibits no edema or tenderness.  Lymphadenopathy:    She has no cervical adenopathy.  Neurological: She is alert and oriented to person, place, and time. No cranial nerve deficit. She exhibits normal muscle tone.  Skin: Skin is warm and dry. No rash noted. No erythema. No pallor.  Nursing note and vitals reviewed.  ED Treatments / Results  DIAGNOSTIC STUDIES: Oxygen Saturation is  100% on RA, normal by my interpretation.   COORDINATION OF CARE: 3:00 AM-Discussed next steps with pt. Pt verbalized understanding and is agreeable with the plan.   Labs (all labs ordered are listed, but only abnormal results are displayed) Labs Reviewed  BASIC METABOLIC PANEL - Abnormal; Notable for the following:       Result Value   Creatinine, Ser 1.14 (*)    Calcium 8.4 (*)    GFR calc non Af Amer 56 (*)    All other components within normal limits  CBC - Abnormal; Notable for the following:    WBC 12.6 (*)    RBC 3.61 (*)    Hemoglobin 9.6 (*)    HCT 29.4 (*)    RDW 16.7 (*)    All other components within normal limits  URINE RAPID DRUG SCREEN, HOSP PERFORMED - Abnormal; Notable for the following:  Tetrahydrocannabinol POSITIVE (*)    All other components within normal limits  MAGNESIUM  URINALYSIS, ROUTINE W REFLEX MICROSCOPIC (NOT AT [facility])  I-STAT TROPOININ, ED  POC URINE PREG, ED    EKG  EKG Interpretation  Date/Time:  Tuesday April 07 2016 02:08:07 EDT Ventricular Rate:  74 PR Interval:    QRS Duration: 98 QT Interval:  452 QTC Calculation: 502 R Axis:   71 Text Interpretation:  Sinus rhythm Interpretation limited secondary to artifact Prolonged QT No significant change since last tracing Confirmed by Erroll Lunani, Waino Mounsey Ayokunle [phone]([id number]) on 04/07/2016 2:44:53 AM       Radiology Dg Chest 2 View  Result Date: 04/07/2016 CLINICAL DATA:  Acute onset of upper mid chest pain and shortness of breath. Initial encounter. EXAM: CHEST  2 VIEW COMPARISON:  Chest radiograph performed 11/28/2015 FINDINGS: The lungs are well-aerated and clear. There is no evidence of focal opacification, pleural effusion or pneumothorax. The heart is borderline normal in size. No acute osseous abnormalities are seen. IMPRESSION: No acute cardiopulmonary process seen. Electronically Signed   By: Roanna RaiderJeffery  Chang M.D.   On: 04/07/2016 03:28    Procedures Procedures (including critical care  time)  Medications Ordered in ED Medications  chlordiazePOXIDE (LIBRIUM) capsule 50 mg (not administered)  magnesium sulfate IVPB 2 g 50 mL (not administered)  potassium chloride SA (K-DUR,KLOR-CON) CR tablet 40 mEq (not administered)  sodium chloride 0.9 % bolus 1,000 mL (1,000 mLs Intravenous New Bag/Given 04/07/16 0326)  ketorolac (TORADOL) 30 MG/ML injection 30 mg (30 mg Intravenous Given 04/07/16 0327)     Initial Impression / Assessment and Plan / ED Course  I have reviewed the triage vital signs and the nursing notes.  Pertinent labs & imaging results that were available during my care of the patient were reviewed by me and considered in my medical decision making (see chart for details).  Clinical Course    Patient presents to the ED for palpitations.  EKG shows prolonged Qt.  Potassium and magnesium are slightly low, will replace them.  She was given IVF as well.  She was given librium for possible anxiety.  No signs of ACS.  She is PERC negative.  She was educated that this also could have been an arrhythmia and the need to follow up with PCP for further care.  She demonstrates good understanding.  She appears well and in NAD.  Vs remain within her normal limits and she is safe for DC.  Final Clinical Impressions(s) / ED Diagnoses   Final diagnoses:  None    New Prescriptions New Prescriptions   No medications on file      I personally performed the services described in this documentation, which was scribed in my presence. The recorded information has been reviewed and is accurate.      Tomasita CrumbleAdeleke Kynesha Guerin, MD 04/07/16 [phone]

## 2016-04-07 NOTE — ED Notes (Signed)
Pt verbalized understanding of discharge instructions.  Ambulatory and independent at discharge. 

## 2016-04-07 NOTE — ED Notes (Signed)
MD at bedside. 

## 2016-04-07 NOTE — ED Notes (Signed)
Patient transported to X-ray 

## 2016-07-09 IMAGING — CR DG ABDOMEN 2V
2 series · 2 of 2 positions shown · non-contrast
Comparison: July 22, 2012

CLINICAL DATA: Three-day history of abdominal cramping and vomiting

EXAM:
ABDOMEN - 2 VIEW

[w abdomen upright]
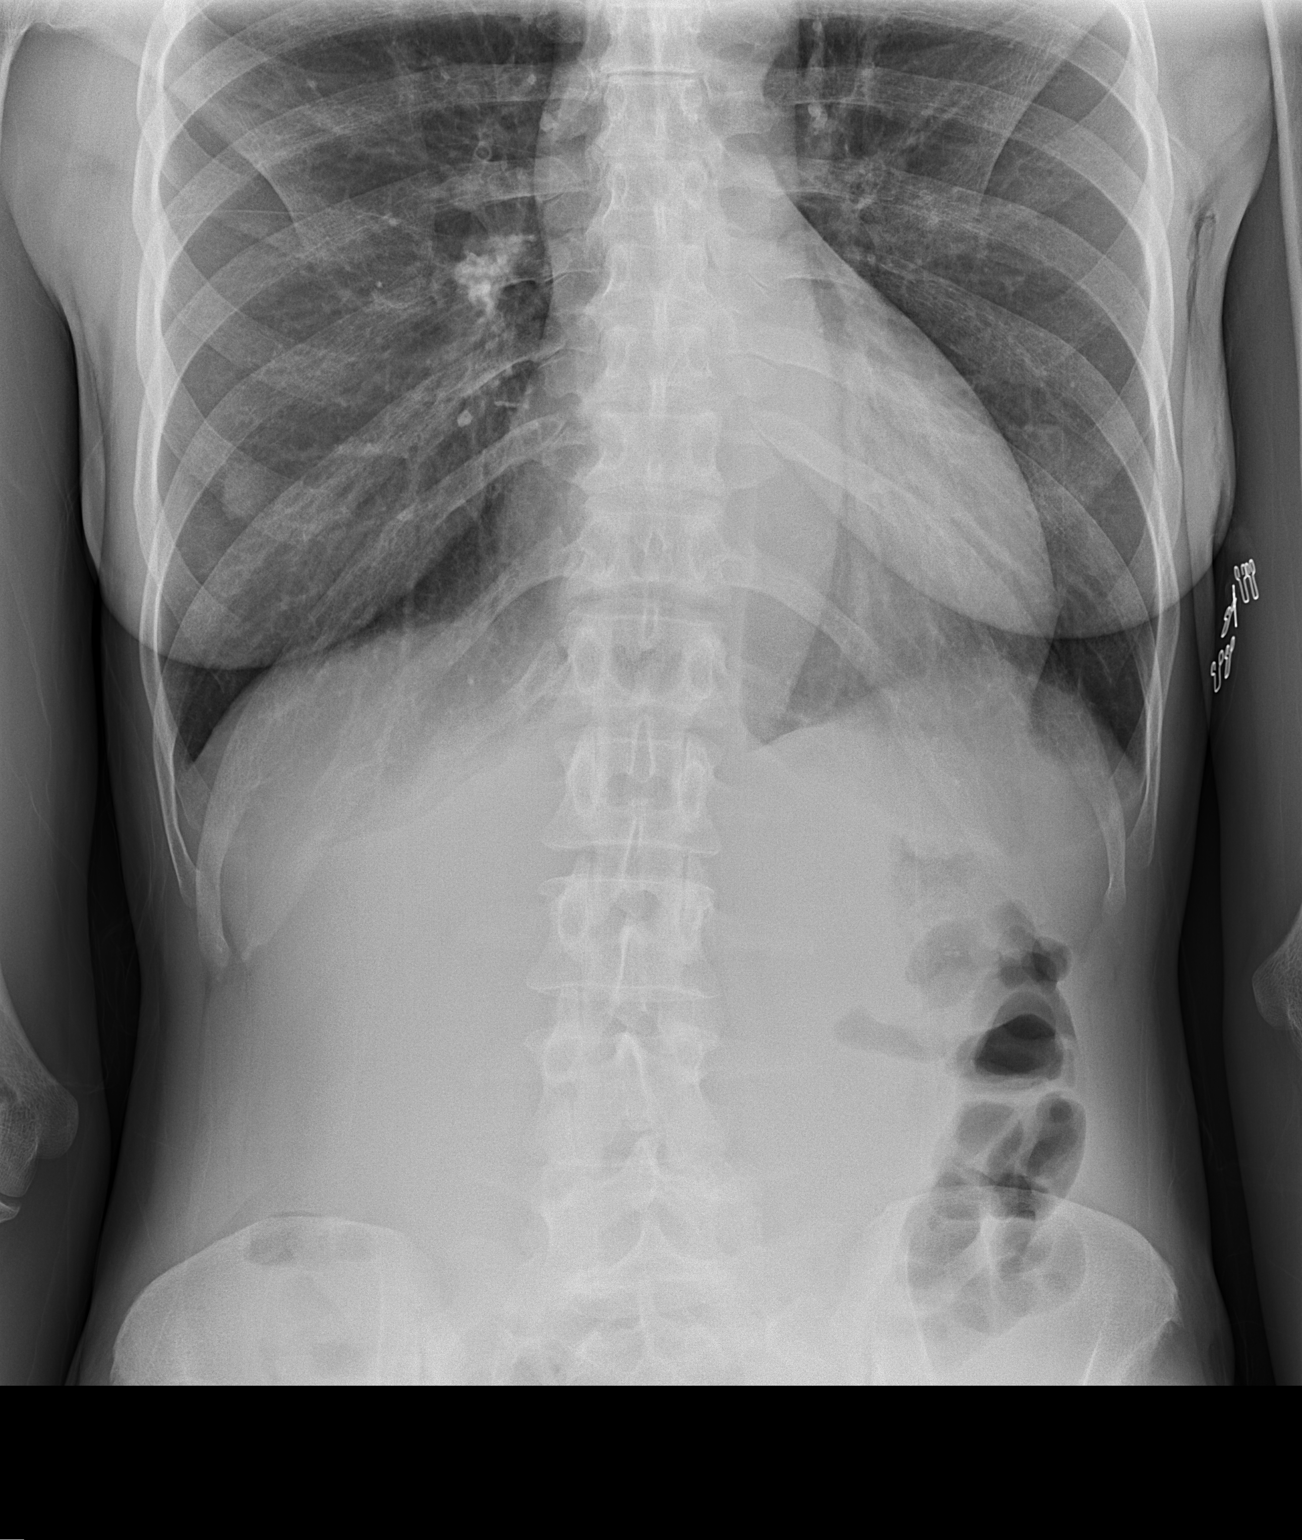

[t abdomen supine]
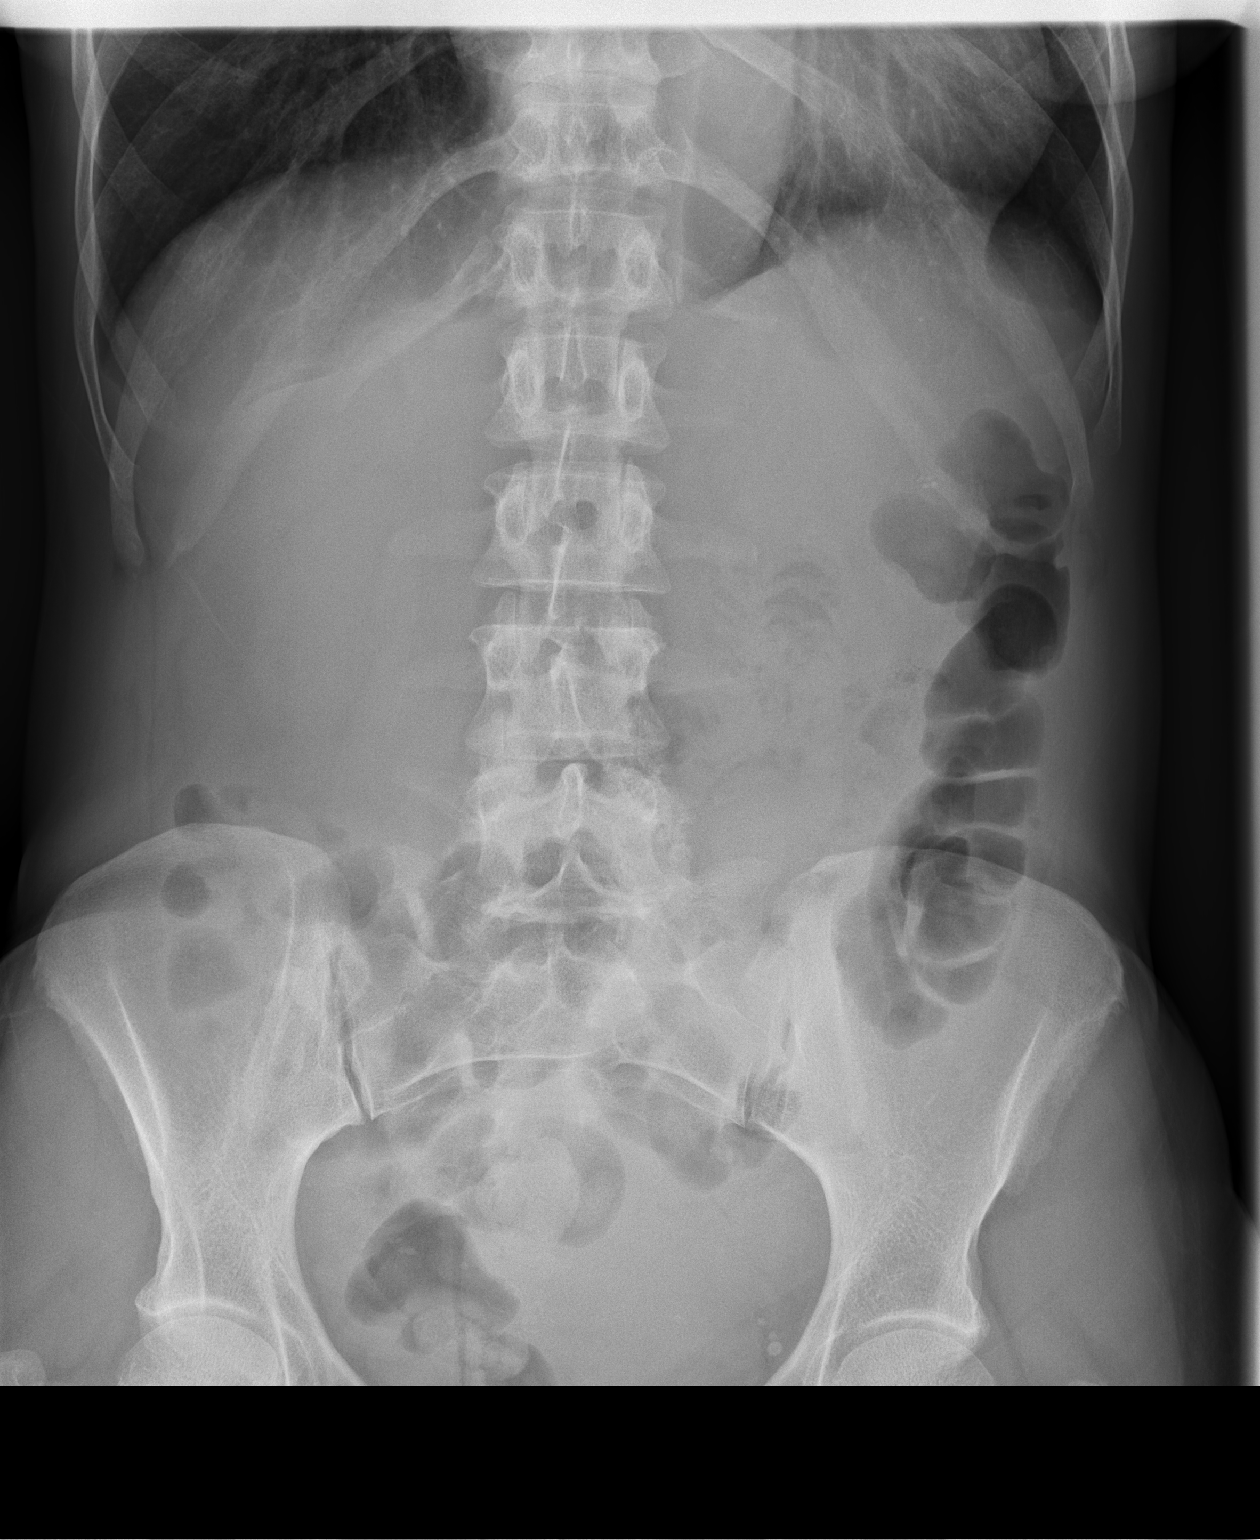

[2 of 2 positions shown; findings below may reference images not displayed]

FINDINGS: Supine and upright images obtained. There is no bowel dilatation or
air-fluid level suggesting obstruction. No free air. There are
phleboliths in the pelvis. Lung bases are clear.
IMPRESSION: Bowel gas pattern unremarkable, without obstruction or free air.

## 2016-10-26 ENCOUNTER — Emergency Department: Admit: 2016-10-26 | Discharge: 2016-10-26

## 2016-10-26 ENCOUNTER — Inpatient Hospital Stay: Admit: 2016-10-26 | Discharge: 2016-10-26

## 2016-10-26 DIAGNOSIS — I10 Essential (primary) hypertension: Principal | ICD-10-CM

## 2016-10-26 DIAGNOSIS — F17210 Nicotine dependence, cigarettes, uncomplicated: Secondary | ICD-10-CM

## 2016-10-26 DIAGNOSIS — M25521 Pain in right elbow: Principal | ICD-10-CM

## 2016-10-26 DIAGNOSIS — M25522 Pain in left elbow: Secondary | ICD-10-CM

## 2016-10-26 MED ORDER — PROPRANOLOL HCL 10 MG PO TABS
Freq: Every day | ORAL
Start: 2016-10-26 — End: 2016-10-26

## 2016-10-26 MED ORDER — LIDOCAINE HCL URETHRAL/MUCOSAL 2 % EX GEL
0 refills | Status: CP
Start: 2016-10-26 — End: ?

## 2016-10-26 MED ORDER — IBUPROFEN 800 MG PO TABS
800 mg | Freq: Four times a day (QID) | ORAL | 0 refills | Status: CP | PRN
Start: 2016-10-26 — End: ?

## 2016-10-26 MED ORDER — METHOCARBAMOL 500 MG PO TABS
500 mg | Freq: Four times a day (QID) | ORAL | 0 refills | Status: CP
Start: 2016-10-26 — End: ?

## 2016-10-26 MED ORDER — KETOROLAC TROMETHAMINE 30 MG/ML IJ SOLN
30 mg | Freq: Once | INTRAMUSCULAR | Status: CP
Start: 2016-10-26 — End: ?

## 2016-10-26 MED ORDER — PROPRANOLOL HCL 10 MG PO TABS
10 mg | Freq: Two times a day (BID) | ORAL | 3 refills | Status: CP
Start: 2016-10-26 — End: ?

## 2016-10-26 NOTE — ED Notes
Bed: ED-23  Expected date:   Expected time:   Means of arrival:   Comments:  2 adc

## 2016-10-26 NOTE — ED Notes
Time of discharge: 1230  PM., Patient discharged to  Home.  Patient discharged  ambulatory. to exit with belongings in  Stable condition.  Patient escorted by  friend., Written discharge instructions given to  patient.  Patient/recipient  verbalizes discharge instructions.

## 2016-10-26 NOTE — ED Triage Notes
Pt to room 23 with c/o bilateral arm pain since January. Pt states that the pain started in her right elbow first and is now present in the left present also. Pt states that pain radiates to her hands and c/o tingling/weakness in grip. Pt states that her job requires repetitive movements with her arms. Pt states that she was diagnosed with bursitis in January when the pain initially started in the right arm. Pt is AAOx4. NAD noted. Pt speaks in clear, complete sentences. Call light in reach.

## 2016-10-27 NOTE — ED Provider Notes
BMI (Calculated) --              Physical Exam   Constitutional: She is oriented to person, place, and time. She appears well-developed and well-nourished. No distress.   HENT:   Head: Normocephalic and atraumatic.   Nose: Nose normal.   Mouth/Throat: Oropharynx is clear and moist.   Eyes: Conjunctivae are normal. Pupils are equal, round, and reactive to light.   Neck: Neck supple. No tracheal deviation present.   Cardiovascular: Normal rate, regular rhythm and normal heart sounds.    Pulses:       Radial pulses are 2+ on the right side, and 2+ on the left side.   Pulmonary/Chest: Effort normal and breath sounds normal. No stridor. No respiratory distress. She has no wheezes. She has no rales.   Abdominal: Soft. There is no tenderness. There is no rebound and no guarding.   Musculoskeletal: She exhibits no edema.        Right shoulder: Normal.        Left shoulder: Normal.        Right elbow: She exhibits normal range of motion, no swelling and no deformity. Tenderness found. Medial epicondyle and lateral epicondyle tenderness noted.        Left elbow: She exhibits normal range of motion, no swelling, no effusion, no deformity and no laceration. Tenderness found. Medial epicondyle and lateral epicondyle tenderness noted.        Right wrist: Normal.        Left wrist: Normal.   Neurological: She is alert and oriented to person, place, and time. She has normal strength. No sensory deficit. She exhibits normal muscle tone. Coordination normal.   Skin: Skin is warm and dry. No rash noted. She is not diaphoretic.   Psychiatric: She has a normal mood and affect. Her behavior is normal. Judgment and thought content normal.   Nursing note and vitals reviewed.      Differential DDx: arthritis, bursitis, tendonitis, other    Is this an Emergent Medical Condition? No - Non-Emergent  409.901 FS  641.19 FS  [phone] (16) FS    ED Workup   Procedures    Labs:  - - No data to display      Imaging (Read by ED Provider):

## 2016-10-27 NOTE — ED Provider Notes
History   No chief complaint on file.      Patient is a 51 y.o. female presenting with elbow pain. The history is provided by the patient. No language interpreter was used.   Elbow Pain   Location:  Elbow  Elbow location:  L elbow and R elbow  Injury: no    Pain details:     Quality:  Sharp and shooting    Radiates to:  R forearm and L forearm    Severity:  Moderate    Onset quality:  Gradual    Timing:  Intermittent    Progression:  Worsening  Dislocation: no    Worsened by:  Movement  Ineffective treatments:  Rest and acetaminophen  Associated symptoms: no back pain, no decreased range of motion, no fatigue, no fever, no muscle weakness, no neck pain, no numbness, no stiffness, no swelling and no tingling        Allergies not on file    Patient's Medications    No medications on file       No past medical history on file.    No past surgical history on file.    No family history on file.    Social History     Social History   ? Marital status: Single     Spouse name: N/A   ? Number of children: N/A   ? Years of education: N/A     Social History Main Topics   ? Smoking status: Not on file   ? Smokeless tobacco: Not on file   ? Alcohol use Not on file   ? Drug use: Not on file   ? Sexual activity: Not on file     Other Topics Concern   ? Not on file     Social History Narrative       Review of Systems   Constitutional: Negative for fever, chills and fatigue.   HENT: Negative for neck pain.    Respiratory: Negative for cough, chest tightness and wheezing.    Gastrointestinal: Negative for nausea, vomiting and abdominal pain.   Musculoskeletal: Negative for back pain, joint swelling and stiffness.   Skin: Negative for color change, pallor, rash and wound.   Neurological: Negative for dizziness, weakness and light-headedness.   All other systems reviewed and are negative.      Physical Exam     ED Triage Vitals   BP --    Pulse --    Resp --    Temp --    Temp src --    Height --    Weight --    SpO2 --

## 2016-10-27 NOTE — ED Attestation Note
Attestation   The patient was seen exclusively by the PA/ARNP, and was not seen by me. I was immediately available in the Emergency Department for consultation as needed.             Perry, Paul K, MD  10/27/16 0616

## 2016-10-27 NOTE — ED Provider Notes
{  Imaging findings:[phone]}      EKG (Read by ED Provider):  not applicable        ED Course & Re-Evaluation     ED Course   pt has no insurance. Will refill BP medicine which she hasn't taken for months and provide contacts for follow up with [facility] card.     MDM   Decide to obtain history from someone other than the patient: No    Decide to obtain previous medical records: No    Clinical Lab Test(s): N/A    Diagnostic Tests (Radiology, EKG): Ordered and Reviewed    Independent Visualization (ED US, Wet Prep, Other): No    Discussed patient with NON-ED Provider: None      ED Disposition   ED Disposition: No ED Disposition Set      ED Clinical Impression   ED Clinical Impression:   No Clinical Impression Set      ED Patient Status   Patient Status:   Good        ED Medical Evaluation Initiated   Medical Evaluation Initiated:   Yes, filed at 10/26/16 1053  by Stefano GaulNicoloso, Francesca, PA-C

## 2016-10-27 NOTE — ED Provider Notes
History     Chief Complaint   Patient presents with   ? Arm Pain       Patient is a 51 y.o. female presenting with elbow pain. The history is provided by the patient. No language interpreter was used.   Elbow Pain   Location:  Elbow  Elbow location:  L elbow and R elbow  Injury: no    Pain details:     Quality:  Sharp and shooting    Radiates to:  R forearm and L forearm    Severity:  Moderate    Onset quality:  Gradual    Timing:  Intermittent    Progression:  Worsening  Dislocation: no    Worsened by:  Movement  Ineffective treatments:  Rest and acetaminophen  Associated symptoms: no back pain, no decreased range of motion, no fatigue, no fever, no muscle weakness, no neck pain, no numbness, no stiffness, no swelling and no tingling        No Known Allergies    Discharge Medication List as of 10/26/2016 12:27 PM      START taking these medications    Details   ibuprofen (ADVIL,MOTRIN) 800 MG PO Tablet Take 1 tablet by mouth every 6 hours as needed for pain.Disp-30 tablet, R-0, Normal      lidocaine (XYLOCAINE) 2 % EX Gel Apply topically as neededDisp-30 mL, R-0, Normal      methocarbamol (ROBAXIN) 500 MG PO Tablet Take 1 tablet by mouth 4 times daily.Disp-20 tablet, R-0, Normal         CONTINUE these medications which have CHANGED    Details   propranolol (INDERAL) 10 MG PO Tablet Take 1 tablet by mouth 2 times daily for 30 days.Disp-60 tablet, R-3, Normal             Past Medical History:   Diagnosis Date   ? Hypertension        Past Surgical History:   Procedure Laterality Date   ? EYE SURGERY         History reviewed. No pertinent family history.    Social History     Social History   ? Marital status: Single     Spouse name: N/A   ? Number of children: N/A   ? Years of education: N/A     Social History Main Topics   ? Smoking status: Current Every Day Smoker     Packs/day: 0.50     Types: Cigarettes   ? Smokeless tobacco: Never Used   ? Alcohol use No   ? Drug use: None   ? Sexual activity: Not Asked

## 2016-10-27 NOTE — ED Provider Notes
Per Radiology: Xr Elbow Left Min 3 Views    Result Date: 10/26/2016  4 views of the left elbow were obtained without prior studies for comparison.  No fractures or dislocations are seen.  No radiopaque foreign bodies or joint effusions are seen.  The visualized soft tissues are unremarkable.     Impression: No significant abnormality of the left elbow. Read By Florencia Reasons- Kristin Taylor M.D.  Electronically Verified By - Florencia ReasonsKristin Taylor M.D.  Released Date Time - 10/26/2016 12:11 PM  Resident -     Per Radiology: Xr Elbow Right Min 3 Views    Result Date: 10/26/2016  4 views of the right elbow were obtained without prior studies for comparison.  No fractures or  dislocations are seen.  No radiopaque foreign bodies or joint effusions are seen.  The visualized soft tissues are unremarkable.     Impression: No significant abnormality of the right elbow. Read By Florencia Reasons- Kristin Taylor M.D.  Electronically Verified By - Florencia ReasonsKristin Taylor M.D.  Released Date Time - 10/26/2016 12:11 PM  Resident -         EKG (Read by ED Provider):  not applicable        ED Course & Re-Evaluation     ED Course   pt has no insurance. Will refill BP medicine which she hasn't taken for months and provide contacts for follow up with [facility] card. Suggested elbow sleeves while working. Ice and rest. Return if any new or worse symptoms.     MDM   Decide to obtain history from someone other than the patient: No    Decide to obtain previous medical records: No    Clinical Lab Test(s): N/A    Diagnostic Tests (Radiology, EKG): Ordered and Reviewed    Independent Visualization (ED US, Wet Prep, Other): No    Discussed patient with NON-ED Provider: None      ED Disposition   ED Disposition: No ED Disposition Set      ED Clinical Impression   ED Clinical Impression:   No Clinical Impression Set      ED Patient Status   Patient Status:   Good        ED Medical Evaluation Initiated   Medical Evaluation Initiated:   Yes, filed at 10/26/16 1053  by Stefano GaulNicoloso, Francesca, PA-C

## 2016-10-27 NOTE — ED Provider Notes
Left elbow: She exhibits normal range of motion, no swelling, no effusion, no deformity and no laceration. Tenderness found. Medial epicondyle and lateral epicondyle tenderness noted.        Right wrist: Normal.        Left wrist: Normal.   Neurological: She is alert and oriented to person, place, and time. She has normal strength. No sensory deficit. She exhibits normal muscle tone. Coordination normal.   Skin: Skin is warm and dry. No rash noted. She is not diaphoretic.   Psychiatric: She has a normal mood and affect. Her behavior is normal. Judgment and thought content normal.   Nursing note and vitals reviewed.      Differential DDx: arthritis, bursitis, tendonitis, other    Is this an Emergent Medical Condition? No - Non-Emergent  409.901 FS  641.19 FS  627.732 (16) FS    ED Workup   Procedures    Labs:  - - No data to display      Imaging (Read by ED Provider):  Per Radiology: Xr Elbow Left Min 3 Views    Result Date: 10/26/2016  4 views of the left elbow were obtained without prior studies for comparison.  No fractures or dislocations are seen.  No radiopaque foreign bodies or joint effusions are seen.  The visualized soft tissues are unremarkable.     Impression: No significant abnormality of the left elbow. Read By Florencia Reasons- Kristin Taylor M.D.  Electronically Verified By - Florencia ReasonsKristin Taylor M.D.  Released Date Time - 10/26/2016 12:11 PM  Resident -     Per Radiology: Xr Elbow Right Min 3 Views    Result Date: 10/26/2016  4 views of the right elbow were obtained without prior studies for comparison.  No fractures or  dislocations are seen.  No radiopaque foreign bodies or joint effusions are seen.  The visualized soft tissues are unremarkable.     Impression: No significant abnormality of the right elbow. Read By Florencia Reasons- Kristin Taylor M.D.  Electronically Verified By - Florencia ReasonsKristin Taylor M.D.  Released Date Time - 10/26/2016 12:11 PM  Resident -         EKG (Read by ED Provider):  not applicable        ED Course & Re-Evaluation

## 2016-10-27 NOTE — ED Provider Notes
ED Course   pt has no insurance. Will refill BP medicine which she hasn't taken for months and provide contacts for follow up with [facility] card. Suggested elbow sleeves while working. Ice and rest. Return if any new or worse symptoms.     MDM   Decide to obtain history from someone other than the patient: No    Decide to obtain previous medical records: No    Clinical Lab Test(s): N/A    Diagnostic Tests (Radiology, EKG): Ordered and Reviewed    Independent Visualization (ED US, Wet Prep, Other): No    Discussed patient with NON-ED Provider: None      ED Disposition   ED Disposition: Discharge      ED Clinical Impression   ED Clinical Impression:   Pain of both elbows      ED Patient Status   Patient Status:   Good        ED Medical Evaluation Initiated   Medical Evaluation Initiated:   Yes, filed at 10/26/16 1053  by Stefano GaulNicoloso, Francesca, PA-C

## 2016-10-27 NOTE — ED Provider Notes
Other Topics Concern   ? None     Social History Narrative   ? None       Review of Systems   Constitutional: Negative for fever, chills and fatigue.   HENT: Negative for neck pain.    Respiratory: Negative for cough, chest tightness and wheezing.    Gastrointestinal: Negative for nausea, vomiting and abdominal pain.   Musculoskeletal: Negative for back pain, joint swelling and stiffness.   Skin: Negative for color change, pallor, rash and wound.   Neurological: Negative for dizziness, weakness and light-headedness.   All other systems reviewed and are negative.      Physical Exam       ED Triage Vitals   BP 10/26/16 1117 162/113   Pulse 10/26/16 1117 82   Resp 10/26/16 1117 15   Temp 10/26/16 1117 36.9 ?C (98.4 ?F)   Temp src 10/26/16 1117 Oral   Height 10/26/16 1117 1.676 m   Weight 10/26/16 1117 52 kg   SpO2 10/26/16 1117 97 %   BMI (Calculated) 10/26/16 1117 18.54             Physical Exam   Constitutional: She is oriented to person, place, and time. She appears well-developed and well-nourished. No distress.   HENT:   Head: Normocephalic and atraumatic.   Nose: Nose normal.   Mouth/Throat: Oropharynx is clear and moist.   Eyes: Conjunctivae are normal. Pupils are equal, round, and reactive to light.   Neck: Neck supple. No tracheal deviation present.   Cardiovascular: Normal rate, regular rhythm and normal heart sounds.    Pulses:       Radial pulses are 2+ on the right side, and 2+ on the left side.   Pulmonary/Chest: Effort normal and breath sounds normal. No stridor. No respiratory distress. She has no wheezes. She has no rales.   Abdominal: Soft. There is no tenderness. There is no rebound and no guarding.   Musculoskeletal: She exhibits no edema.        Right shoulder: Normal.        Left shoulder: Normal.        Right elbow: She exhibits normal range of motion, no swelling and no deformity. Tenderness found. Medial epicondyle and lateral epicondyle tenderness noted.

## 2017-05-01 IMAGING — CR DG ABDOMEN ACUTE W/ 1V CHEST
3 series · 3 of 3 positions shown · non-contrast
Comparison: 06/24/2015

CLINICAL DATA: Productive cough for 2 days. Night sweats. Emesis
today.

EXAM:
DG ABDOMEN ACUTE W/ 1V CHEST

[chest pa]
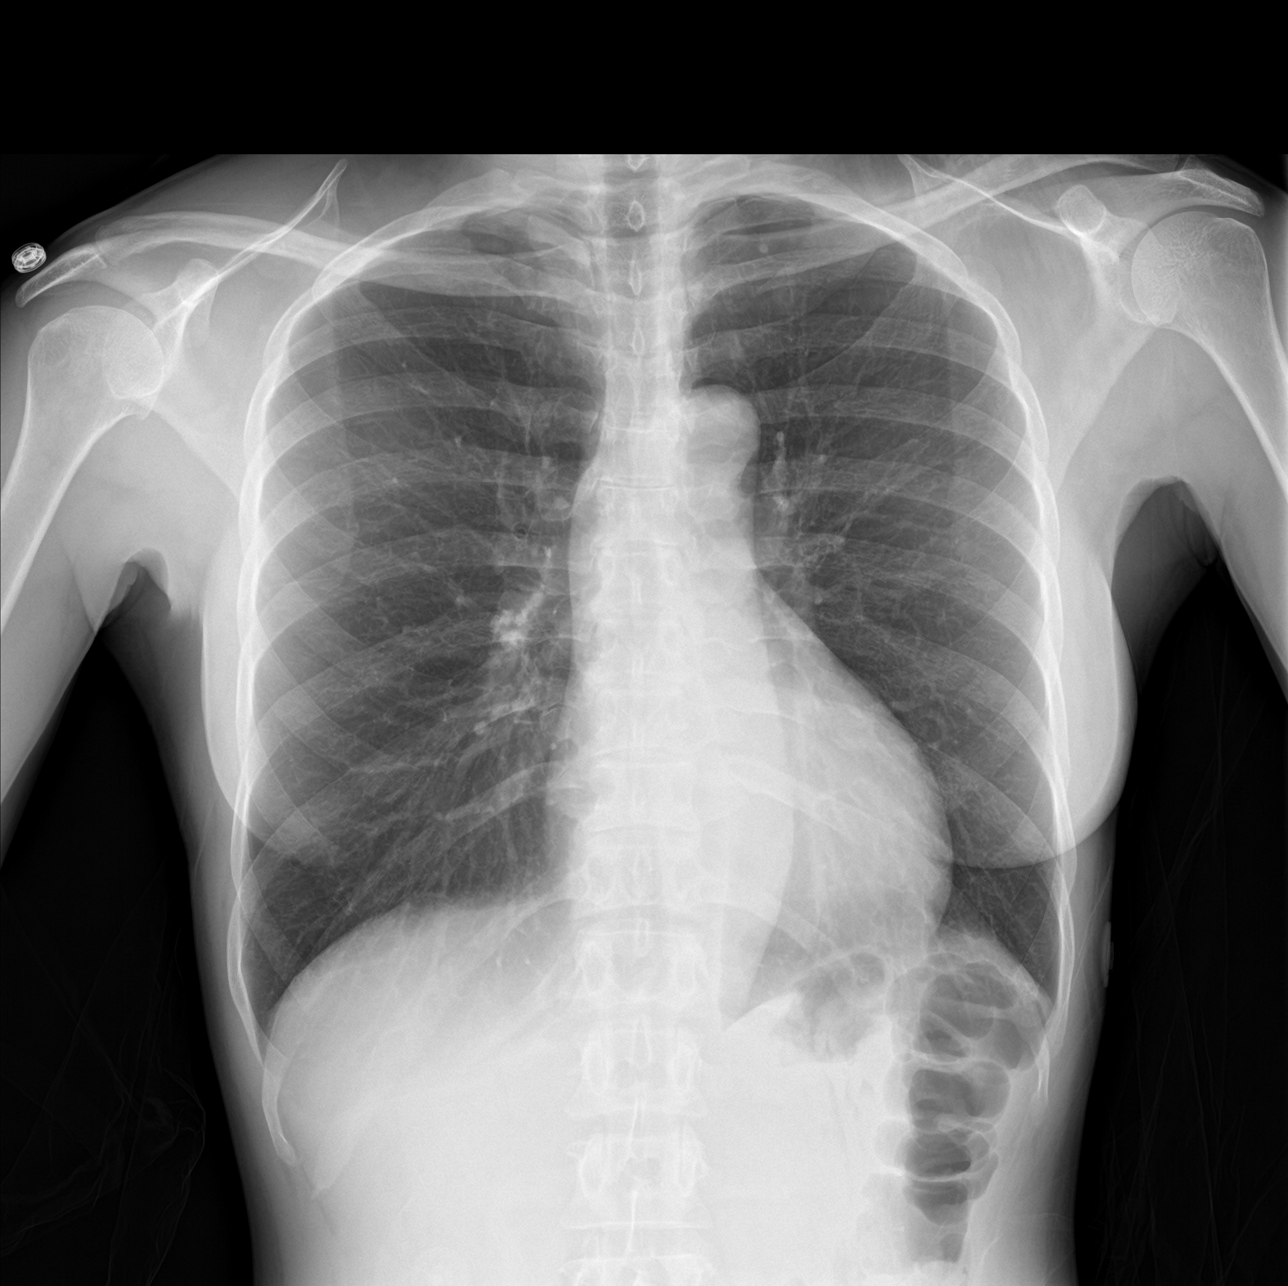

[abdomen erect]
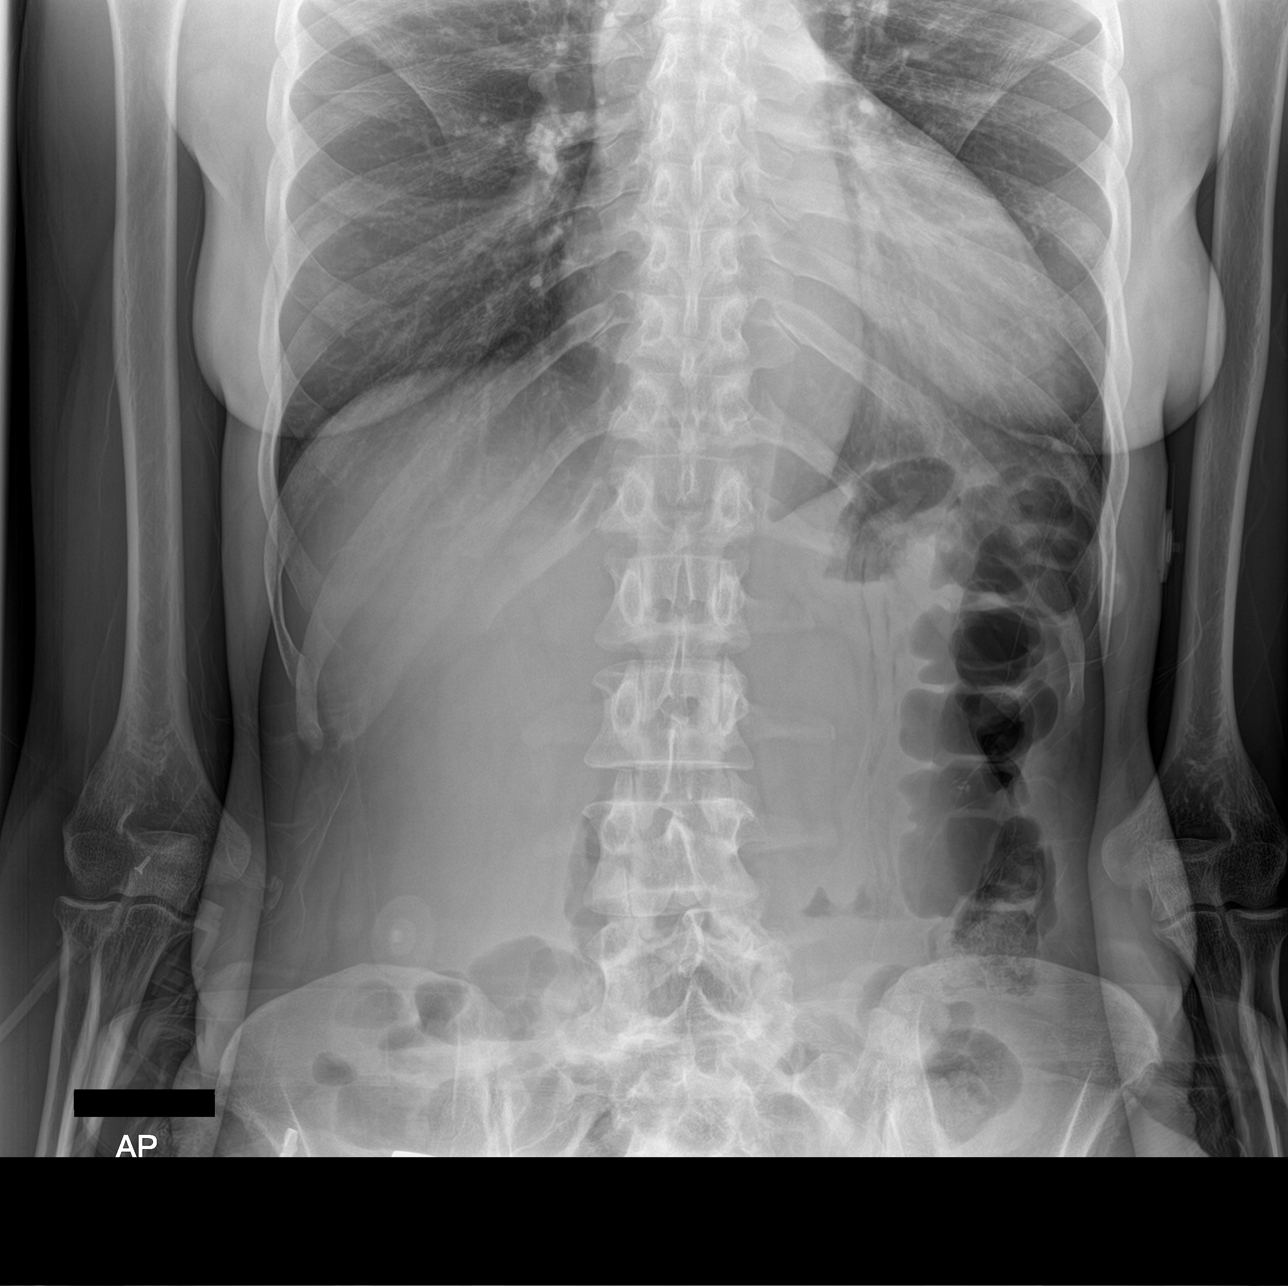

[abdomen supine]
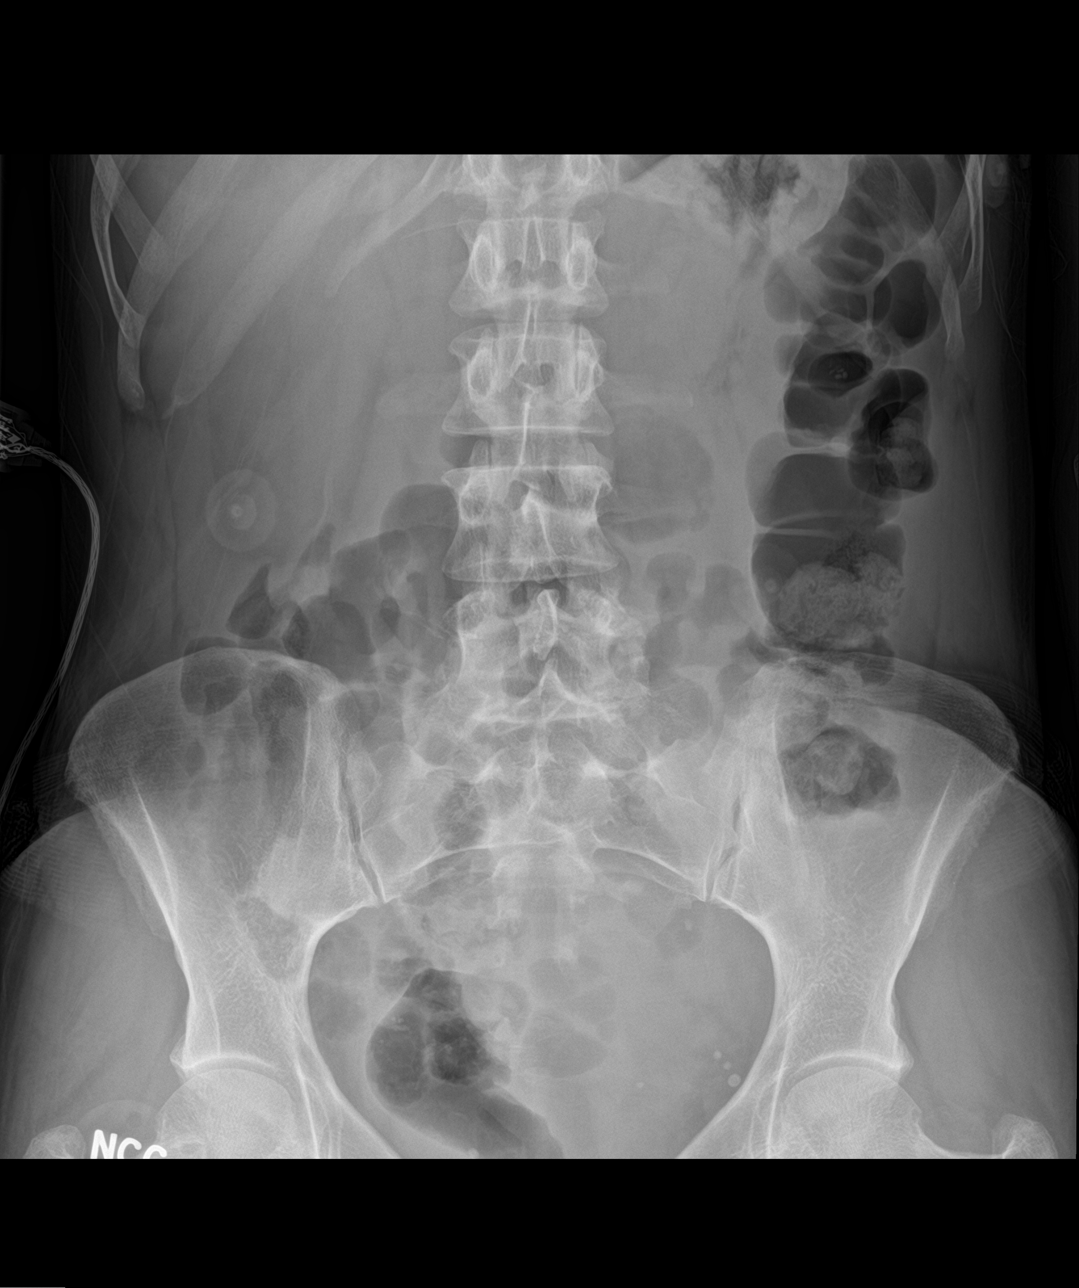

[3 of 3 positions shown; findings below may reference images not displayed]

FINDINGS: There is no evidence of dilated bowel loops or free intraperitoneal
air. No radiopaque calculi or other significant radiographic
abnormality is seen. Heart size and mediastinal contours are within
normal limits. Moderate unchanged hyperinflation. The lungs are
clear. The pulmonary vasculature is normal.
IMPRESSION: Negative abdominal radiographs.  No acute cardiopulmonary disease.

## 2021-10-09 ENCOUNTER — Emergency Department (HOSPITAL_COMMUNITY)
Admission: EM | Admit: 2021-10-09 | Discharge: 2021-10-09 | Disposition: A | Discharge status: Home / Self Care | Payer: Self-pay | Attending: Emergency Medicine | Admitting: Emergency Medicine

## 2021-10-09 ENCOUNTER — Encounter (HOSPITAL_COMMUNITY): Payer: Self-pay | Admitting: Emergency Medicine

## 2021-10-09 DIAGNOSIS — Z5321 Procedure and treatment not carried out due to patient leaving prior to being seen by health care provider: Secondary | ICD-10-CM | POA: Insufficient documentation

## 2021-10-09 DIAGNOSIS — L0201 Cutaneous abscess of face: Secondary | ICD-10-CM | POA: Insufficient documentation

## 2021-10-09 NOTE — ED Provider Triage Note (Signed)
Emergency Medicine Provider Triage Evaluation Note  Leslie Golden , a 56 y.o. female  was evaluated in triage.  Pt complains of left cheek abscess.  States that same first formed 2 weeks ago and drained spontaneously without issue.  States that this morning when she woke up she noticed that it had reaccumulated.  States that she has had abscesses in the past that required drainage.  She is not diabetic.  She denies any painful eye movements or fevers or chills.  Review of Systems  Positive: Left cheek abscess Negative: Fevers, chills  Physical Exam  BP (!) 176/128 (BP Location: Right Arm)    Pulse (!) 109    Temp 99 F (37.2 C) (Oral)    Resp 17    SpO2 99%  Gen:   Awake, no distress   Resp:  Normal effort  MSK:   Moves extremities without difficulty  Other:  Fluctuance noted to left cheek.  PERRLA, EOMs intact without pain  Medical Decision Making  Medically screening exam initiated at 3:17 PM.  Appropriate orders placed.  Leslie Golden was informed that the remainder of the evaluation will be completed by another provider, this initial triage assessment does not replace that evaluation, and the importance of remaining in the ED until their evaluation is complete.     Silva Bandy, PA-C 10/09/21 1520

## 2021-10-09 NOTE — ED Triage Notes (Signed)
Patient complains of abscess on left cheek that appeared two and a half weeks ago. Patient reports it drained on its own several days ago but reaccumulated. Patient alert, oriented, afebrile, and in no apparent distress at this time.

## 2021-10-09 NOTE — ED Notes (Signed)
PT decided to leave AMA °

## 2022-03-01 ENCOUNTER — Other Ambulatory Visit: Payer: Self-pay

## 2022-03-01 ENCOUNTER — Emergency Department (HOSPITAL_COMMUNITY): Payer: Self-pay

## 2022-03-01 ENCOUNTER — Encounter (HOSPITAL_COMMUNITY): Payer: Self-pay

## 2022-03-01 ENCOUNTER — Emergency Department (HOSPITAL_COMMUNITY)
Admission: EM | Admit: 2022-03-01 | Discharge: 2022-03-01 | Disposition: A | Discharge status: Home / Self Care | Payer: Self-pay | Attending: Emergency Medicine | Admitting: Emergency Medicine

## 2022-03-01 DIAGNOSIS — I10 Essential (primary) hypertension: Secondary | ICD-10-CM | POA: Insufficient documentation

## 2022-03-01 DIAGNOSIS — J069 Acute upper respiratory infection, unspecified: Secondary | ICD-10-CM | POA: Insufficient documentation

## 2022-03-01 DIAGNOSIS — Z20822 Contact with and (suspected) exposure to covid-19: Secondary | ICD-10-CM | POA: Insufficient documentation

## 2022-03-01 DIAGNOSIS — Z79899 Other long term (current) drug therapy: Secondary | ICD-10-CM | POA: Insufficient documentation

## 2022-03-01 DIAGNOSIS — G43809 Other migraine, not intractable, without status migrainosus: Secondary | ICD-10-CM | POA: Insufficient documentation

## 2022-03-01 LAB — CBC WITH DIFFERENTIAL/PLATELET
Abs Immature Granulocytes: 0.03 10*3/uL (ref 0.00–0.07)
Basophils Absolute: 0.1 10*3/uL (ref 0.0–0.1)
Basophils Relative: 1 %
Eosinophils Absolute: 0.1 10*3/uL (ref 0.0–0.5)
Eosinophils Relative: 1 %
HCT: 38.9 % (ref 36.0–46.0)
Hemoglobin: 12.2 g/dL (ref 12.0–15.0)
Immature Granulocytes: 0 %
Lymphocytes Relative: 36 %
Lymphs Abs: 3.6 10*3/uL (ref 0.7–4.0)
MCH: 26.3 pg (ref 26.0–34.0)
MCHC: 31.4 g/dL (ref 30.0–36.0)
MCV: 84 fL (ref 80.0–100.0)
Monocytes Absolute: 0.7 10*3/uL (ref 0.1–1.0)
Monocytes Relative: 7 %
Neutro Abs: 5.4 10*3/uL (ref 1.7–7.7)
Neutrophils Relative %: 55 %
Platelets: 238 10*3/uL (ref 150–400)
RBC: 4.63 MIL/uL (ref 3.87–5.11)
RDW: 16.1 % — ABNORMAL HIGH (ref 11.5–15.5)
WBC: 9.9 10*3/uL (ref 4.0–10.5)
nRBC: 0 % (ref 0.0–0.2)

## 2022-03-01 LAB — BASIC METABOLIC PANEL
Anion gap: 11 (ref 5–15)
BUN: 15 mg/dL (ref 6–20)
CO2: 29 mmol/L (ref 22–32)
Calcium: 9.2 mg/dL (ref 8.9–10.3)
Chloride: 101 mmol/L (ref 98–111)
Creatinine, Ser: 0.99 mg/dL (ref 0.44–1.00)
GFR, Estimated: >60 mL/min (ref >60)
Glucose, Bld: 92 mg/dL (ref 70–99)
Potassium: 4.5 mmol/L (ref 3.5–5.1)
Sodium: 141 mmol/L (ref 135–145)

## 2022-03-01 LAB — RESP PANEL BY RT-PCR (FLU A&B, COVID) ARPGX2
Influenza A by PCR: NEGATIVE
Influenza B by PCR: NEGATIVE
SARS Coronavirus 2 by RT PCR: NEGATIVE

## 2022-03-01 LAB — SARS CORONAVIRUS 2 BY RT PCR: SARS Coronavirus 2 by RT PCR: NEGATIVE

## 2022-03-01 LAB — MAGNESIUM: Magnesium: 2 mg/dL (ref 1.7–2.4)

## 2022-03-01 MED ORDER — DIPHENHYDRAMINE HCL 50 MG/ML IJ SOLN
12.5000 mg | Freq: Once | INTRAMUSCULAR | Status: AC
  Administered 2022-03-01: 12.5 mg via INTRAVENOUS
  Filled 2022-03-01: qty 1

## 2022-03-01 MED ORDER — METOCLOPRAMIDE HCL 10 MG PO TABS: 10.0000 mg | ORAL_TABLET | Freq: Four times a day (QID) | ORAL | 0 refills | Status: DC | PRN

## 2022-03-01 MED ORDER — ACETAMINOPHEN 500 MG PO TABS
1000.0000 mg | ORAL_TABLET | Freq: Once | ORAL | Status: AC
  Administered 2022-03-01: 1000 mg via ORAL
  Filled 2022-03-01: qty 2

## 2022-03-01 MED ORDER — SODIUM CHLORIDE 0.9 % IV SOLN: INTRAVENOUS | Status: DC

## 2022-03-01 MED ORDER — METOCLOPRAMIDE HCL 5 MG/ML IJ SOLN
10.0000 mg | Freq: Once | INTRAMUSCULAR | Status: AC
  Administered 2022-03-01: 10 mg via INTRAVENOUS
  Filled 2022-03-01: qty 2

## 2022-03-01 MED ORDER — SODIUM CHLORIDE 0.9 % IV BOLUS
1000.0000 mL | Freq: Once | INTRAVENOUS | Status: AC
  Administered 2022-03-01: 1000 mL via INTRAVENOUS

## 2022-03-01 NOTE — Discharge Instructions (Signed)
Take motrin, tylenol for headache. Take reglan for worse headaches   See your doctor for follow up   Return to ER if you have worse shortness of breath, cough, fever, headache

## 2022-03-01 NOTE — ED Provider Notes (Signed)
  Physical Exam  BP (!) 184/122 (BP Location: Right Arm)   Pulse 72   Temp 98.3 F (36.8 C) (Oral)   Resp 16   SpO2 100%   Physical Exam  Procedures  Procedures  ED Course / MDM    Medical Decision Making Care assumed at 6 pm. Patient here with headaches. 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 COVID negative. Sign out pending labs and migraine cocktail and reassessment   7:00 PM I reviewed labs and imaging studies. Labs and CT head and CXR clear. Felt better after migraine cockail. Wants to go home. Stable for discharge   Problems Addressed: Other migraine without status migrainosus, not intractable: acute illness or injury Viral URI: acute illness or injury  Amount and/or Complexity of Data Reviewed Labs: ordered. Decision-making details documented in ED Course. Radiology: ordered and independent interpretation performed. Decision-making details documented in ED Course.  Risk OTC drugs. Prescription drug management.          Charlynne Pander, MD 03/01/22 1901

## 2022-03-01 NOTE — ED Notes (Signed)
Patient verbalizes understanding of discharge instructions. Opportunity for questioning and answers were provided. Armband removed by staff, pt discharged from ED. Pt ambulatory to ED waiting room. 

## 2022-03-01 NOTE — ED Triage Notes (Signed)
Patient complains of fatigue, chills and intermittent dizziness since Friday. Denies pain, alert and oriented, no cough

## 2022-03-01 NOTE — ED Provider Notes (Signed)
[facility] EMERGENCY DEPARTMENT Provider Note   CSN: [id number] Arrival date & time: 03/01/22  1011     History  No chief complaint on file.   Leslie Golden is a 56 y.o. female.  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  The patient endorses a headache, described as bilateral, throbbing, associated nausea, light sensitivity and lightheadedness.  No vomiting.  She denies any cough.  She denies any fevers at home.  She endorses a sore throat and a hoarse voice.  She denies any neck stiffness or pain with range of motion of the neck.  She denies any unilateral swelling of the neck.  She denies any tongue elevation or difficulty breathing or swallowing.  Home Medications Prior to Admission medications   Medication Sig Start Date End Date Taking? Authorizing Provider  amLODipine (NORVASC) 5 MG tablet Take 1 tablet (5 mg total) by mouth daily. Patient not taking: Reported on 04/07/2016 11/30/15   Renae Fickle, MD  carvedilol (COREG) 25 MG tablet Take 1 tablet (25 mg total) by mouth 2 (two) times daily with a meal. Patient not taking: Reported on 04/07/2016 11/30/15   Renae Fickle, MD  ferrous sulfate 325 (65 FE) MG tablet Take 1 tablet (325 mg total) by mouth 2 (two) times daily with a meal. Patient not taking: Reported on 04/07/2016 12/04/15   Swords, Valetta Mole, MD  lisinopril (PRINIVIL,ZESTRIL) 10 MG tablet Take 1 tablet (10 mg total) by mouth daily. Patient not taking: Reported on 04/07/2016 11/30/15   Renae Fickle, MD  LORazepam (ATIVAN) 1 MG tablet Take 1 tablet (1 mg total) by mouth every 6 (six) hours as needed for anxiety. Patient not taking: Reported on 04/07/2016 11/30/15   Renae Fickle, MD  ondansetron (ZOFRAN ODT) 4 MG disintegrating tablet Take 1 tablet  (4 mg total) by mouth every 8 (eight) hours as needed for nausea or vomiting. Patient not taking: Reported on 04/07/2016 11/30/15   Renae Fickle, MD  pantoprazole (PROTONIX) 40 MG tablet Take 1 tablet (40 mg total) by mouth 2 (two) times daily before a meal. Patient not taking: Reported on 12/04/2015 11/30/15   Renae Fickle, MD  sucralfate (CARAFATE) 1 GM/10ML suspension Take 10 mLs (1 g total) by mouth 4 (four) times daily -  with meals and at bedtime. Patient not taking: Reported on 12/04/2015 11/30/15   Renae Fickle, MD  traMADol (ULTRAM) 50 MG tablet Take 1 tablet (50 mg total) by mouth every 6 (six) hours as needed for severe pain. Patient not taking: Reported on 04/07/2016 11/30/15   Renae Fickle, MD      Allergies    Patient has no known allergies.    Review of Systems   Review of Systems  Constitutional:  Positive for chills.  HENT:  Positive for sore throat.   Neurological:  Positive for light-headedness and headaches.  All other systems reviewed and are negative.   Physical Exam Updated Vital Signs BP (!) 184/122 (BP Location: Right Arm)   Pulse 72   Temp 98.3 F (36.8 C) (Oral)   Resp 16   SpO2 100%  Physical Exam Vitals and nursing note reviewed.  Constitutional:      Appearance: She is well-developed.  HENT:     Head: Normocephalic and atraumatic.     Comments: Bilateral oropharyngeal erythema, no  significant tonsillar exudate, no PTA, no evidence of RPA. Mild bilateral temporal arterial tenderness, palpable temporal arterial pulses. Eyes:     Conjunctiva/sclera: Conjunctivae normal.  Cardiovascular:     Rate and Rhythm: Normal rate and regular rhythm.  Pulmonary:     Effort: Pulmonary effort is normal. No respiratory distress.     Breath sounds: Normal breath sounds.  Abdominal:     Palpations: Abdomen is soft.     Tenderness: There is no abdominal tenderness.  Musculoskeletal:        General: No swelling.     Cervical back: Full passive range of  motion without pain, normal range of motion and neck supple.  Skin:    General: Skin is warm and dry.     Capillary Refill: Capillary refill takes less than 2 seconds.  Neurological:     Mental Status: She is alert.  Psychiatric:        Mood and Affect: Mood normal.     ED Results / Procedures / Treatments   Labs (all labs ordered are listed, but only abnormal results are displayed) Labs Reviewed  SARS CORONAVIRUS 2 BY RT PCR  RESP PANEL BY RT-PCR (FLU A&B, COVID) ARPGX2  BASIC METABOLIC PANEL  CBC WITH DIFFERENTIAL/PLATELET  MAGNESIUM    EKG None  Radiology DG Chest 2 View  Result Date: 03/01/2022 CLINICAL DATA:  cough EXAM: CHEST - 2 VIEW COMPARISON:  04/07/2016. FINDINGS: The heart size and mediastinal contours are within normal limits. Both lungs are clear. No visible pleural effusions or pneumothorax. No acute osseous abnormality. IMPRESSION: No active cardiopulmonary disease. Electronically Signed   By: Feliberto Harts M.D.   On: 03/01/2022 10:55    Procedures Procedures    Medications Ordered in ED Medications  sodium chloride 0.9 % bolus 1,000 mL (has no administration in time range)    And  0.9 %  sodium chloride infusion (has no administration in time range)  metoCLOPramide (REGLAN) injection 10 mg (has no administration in time range)  diphenhydrAMINE (BENADRYL) injection 12.5 mg (has no administration in time range)  acetaminophen (TYLENOL) tablet 1,000 mg (has no administration in time range)    ED Course/ Medical Decision Making/ A&P                           Medical Decision Making Amount and/or Complexity of Data Reviewed Labs: ordered. Radiology: ordered.  Risk OTC drugs. Prescription drug management.   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  The patient endorses a headache, described as bilateral, throbbing, associated nausea, light sensitivity and lightheadedness.  No vomiting.  She denies any cough.  She denies any fevers at home.  She endorses a sore throat and a hoarse voice.  She denies any neck stiffness or pain with range of motion of the neck.  She denies any unilateral swelling of the neck.  She denies any tongue elevation or difficulty breathing or swallowing.  On arrival, the patient was afebrile, vitally stable, hypertensive BP 182/120, subsequent proved 154/117 without intervention.   On my exam, the patient is well-appearing and well-hydrated.  The patient's lungs are clear to auscultation bilaterally. Additionally, the patient has a soft/non-tender abdomen, clear tympanic membranes, and no oropharyngeal exudates.  There are no signs of meningismus.  I see no signs  of an acute bacterial infection. She has oropharyngeal erythema without exudate consistent with likely viral URI.  The patient's presentation is most consistent with a viral upper respiratory infection and migraine headache.  I have a low suspicion for pneumonia as the patient's cough has been non-productive and the patient is neither tachypneic nor hypoxic on room air.  Additionally, the patient is CTAB. Her CXR was clear.  Regarding her headache, she is awake, alert, GCS 15, HDS, and afebrile. Her exam is most notable for fully intact extraocular motions with bilaterally reactive pupils, no focal neurologic deficits, no meningismus. She does have bilateral temporal arterial tenderness to palpation but has intact temporal pulses bilaterally.  She has no history of jaw claudication.  Low suspicion for temporal arteritis.  There is no rash. The headache was not sudden onset or the worst headache of the patient's life. There is no visual deficit.  I do not think the patient has an aneurysm, intracranial bleed, mass lesion, meningitis, temporal arteritis, stroke,  cluster headache, idiopathic intracranial hypertension, cavernous sinus thrombosis, carbon monoxide toxicity, herpes zoster, carotid or vertebral artery dissection, or acute angle close glaucoma.   Patient presenting with headache symptoms consistent with migraine headache in the setting of what sounds to be a viral URI.  Will obtain IV access, administer migraine cocktail, IV fluid bolus, IV Reglan and Benadryl, p.o. Tylenol.  COVID-19 influenza PCR testing was obtained in addition to CBC, BMP, magnesium.  Chest x-ray was performed revealed no acute cardiac or pulmonary abnormality.   Plan to treat the patient and reassess.  Signout given to Dr. Silverio Lay at 1800.   ED Medication Summary: Medications  sodium chloride 0.9 % bolus 1,000 mL (has no administration in time range)    And  0.9 %  sodium chloride infusion (has no administration in time range)  metoCLOPramide (REGLAN) injection 10 mg (has no administration in time range)  diphenhydrAMINE (BENADRYL) injection 12.5 mg (has no administration in time range)  acetaminophen (TYLENOL) tablet 1,000 mg (has no administration in time range)     Final Clinical Impression(s) / ED Diagnoses Final diagnoses:  Viral URI  Other migraine without status migrainosus, not intractable    Rx / DC Orders ED Discharge Orders     None         Ernie Avena, MD 03/01/22 1761

## 2022-03-01 NOTE — ED Notes (Signed)
MD aware of pt BP, Pt instructed to take home BP meds.

## 2022-03-20 ENCOUNTER — Emergency Department (HOSPITAL_COMMUNITY)
Admission: EM | Admit: 2022-03-20 | Discharge: 2022-03-20 | Disposition: A | Discharge status: Home / Self Care | Payer: Self-pay | Attending: Emergency Medicine | Admitting: Emergency Medicine

## 2022-03-20 ENCOUNTER — Emergency Department (HOSPITAL_COMMUNITY): Payer: Self-pay

## 2022-03-20 ENCOUNTER — Other Ambulatory Visit: Payer: Self-pay

## 2022-03-20 ENCOUNTER — Encounter (HOSPITAL_COMMUNITY): Payer: Self-pay | Admitting: Emergency Medicine

## 2022-03-20 DIAGNOSIS — R61 Generalized hyperhidrosis: Secondary | ICD-10-CM | POA: Insufficient documentation

## 2022-03-20 DIAGNOSIS — R42 Dizziness and giddiness: Secondary | ICD-10-CM | POA: Insufficient documentation

## 2022-03-20 DIAGNOSIS — R079 Chest pain, unspecified: Secondary | ICD-10-CM

## 2022-03-20 DIAGNOSIS — I10 Essential (primary) hypertension: Secondary | ICD-10-CM | POA: Insufficient documentation

## 2022-03-20 DIAGNOSIS — R0602 Shortness of breath: Secondary | ICD-10-CM | POA: Insufficient documentation

## 2022-03-20 DIAGNOSIS — Z79899 Other long term (current) drug therapy: Secondary | ICD-10-CM | POA: Insufficient documentation

## 2022-03-20 DIAGNOSIS — R0789 Other chest pain: Secondary | ICD-10-CM | POA: Insufficient documentation

## 2022-03-20 LAB — BASIC METABOLIC PANEL
Anion gap: 10 (ref 5–15)
BUN: 10 mg/dL (ref 6–20)
CO2: 23 mmol/L (ref 22–32)
Calcium: 9.4 mg/dL (ref 8.9–10.3)
Chloride: 106 mmol/L (ref 98–111)
Creatinine, Ser: 0.84 mg/dL (ref 0.44–1.00)
GFR, Estimated: >60 mL/min (ref >60)
Glucose, Bld: 77 mg/dL (ref 70–99)
Potassium: 3.6 mmol/L (ref 3.5–5.1)
Sodium: 139 mmol/L (ref 135–145)

## 2022-03-20 LAB — CBC
HCT: 36.2 % (ref 36.0–46.0)
Hemoglobin: 12.1 g/dL (ref 12.0–15.0)
MCH: 26.6 pg (ref 26.0–34.0)
MCHC: 33.4 g/dL (ref 30.0–36.0)
MCV: 79.6 fL — ABNORMAL LOW (ref 80.0–100.0)
Platelets: 246 K/uL (ref 150–400)
RBC: 4.55 MIL/uL (ref 3.87–5.11)
RDW: 15.9 % — ABNORMAL HIGH (ref 11.5–15.5)
WBC: 11 K/uL — ABNORMAL HIGH (ref 4.0–10.5)
nRBC: 0 % (ref 0.0–0.2)

## 2022-03-20 LAB — HEPATIC FUNCTION PANEL
ALT: 13 U/L (ref 0–44)
AST: 20 U/L (ref 15–41)
Albumin: 4 g/dL (ref 3.5–5.0)
Alkaline Phosphatase: 77 U/L (ref 38–126)
Bilirubin, Direct: <0.1 mg/dL (ref 0.0–0.2)
Total Bilirubin: 0.5 mg/dL (ref 0.3–1.2)
Total Protein: 7.8 g/dL (ref 6.5–8.1)

## 2022-03-20 LAB — D-DIMER, QUANTITATIVE: D-Dimer, Quant: 0.5 ug/mL-FEU (ref 0.00–0.50)

## 2022-03-20 LAB — I-STAT BETA HCG BLOOD, ED (MC, WL, AP ONLY): I-stat hCG, quantitative: 6.3 m[IU]/mL — ABNORMAL HIGH

## 2022-03-20 LAB — TROPONIN I (HIGH SENSITIVITY)
Troponin I (High Sensitivity): 5 ng/L
Troponin I (High Sensitivity): 5 ng/L (ref <18)

## 2022-03-20 LAB — LIPASE, BLOOD: Lipase: 24 U/L (ref 11–51)

## 2022-03-20 LAB — HCG, SERUM, QUALITATIVE: Preg, Serum: NEGATIVE

## 2022-03-20 MED ORDER — DICLOFENAC SODIUM 1 % EX GEL: 4.0000 g | Freq: Four times a day (QID) | CUTANEOUS | 0 refills | Status: DC

## 2022-03-20 MED ORDER — ACETAMINOPHEN 325 MG PO TABS
650.0000 mg | ORAL_TABLET | Freq: Once | ORAL | Status: AC
  Administered 2022-03-20: 650 mg via ORAL
  Filled 2022-03-20: qty 2

## 2022-03-20 MED ORDER — AMLODIPINE BESYLATE 5 MG PO TABS: 5.0000 mg | ORAL_TABLET | Freq: Every day | ORAL | 0 refills | Status: DC

## 2022-03-20 NOTE — ED Notes (Signed)
Patient angry due to long wait.  Fed patient and patient requesting referral to mental health to get back on meds to help her keep her temper under control.  Dr Adela Lank notified.

## 2022-03-20 NOTE — ED Provider Notes (Signed)
I received the patient in signout from Dr. Rush Landmark, briefly the patient is a 56 year old female with a chief complaints of chest pain.  She has been seen multiple times for this in the past.  Thought to be musculoskeletal in the past.  Plan for 2 troponins.  D-dimer is negative.  Troponins have resulted and are negative.  LFTs and lipase are unremarkable.  She is notably hypertensive here.  Has been hypertensive at every ED visit that I see on record review up until 2015.  Patient was encouraged to seek primary care.  We will represcribe amlodipine which she had been on previously.  Patient also feels that she would benefit from a prescription for Xanax.  Given outpatient resources for mental health.   Melene Plan, DO 03/20/22 1643

## 2022-03-20 NOTE — ED Provider Notes (Signed)
[facility] [place] [facility]-EMERGENCY DEPT Provider Note   CSN: [id number] Arrival date & time: 03/20/22  1207     History  Chief Complaint  Patient presents with   Chest Pain    Leslie Golden is a 56 y.o. female.  The history is provided by the patient and medical records.  Chest Pain Pain location:  R lateral chest and R chest Pain quality: sharp   Pain radiates to:  Does not radiate Pain severity:  Severe Onset quality:  Sudden Timing:  Intermittent Progression:  Improving Chronicity:  Recurrent Relieved by:  Nothing Worsened by:  Deep breathing Ineffective treatments:  None tried Associated symptoms: diaphoresis and shortness of breath   Associated symptoms: no abdominal pain, no anxiety, no back pain, no cough, no dizziness, no fever, no headache, no lower extremity edema, no nausea, no near-syncope, no palpitations, no vomiting and no weakness        Home Medications Prior to Admission medications   Medication Sig Start Date End Date Taking? Authorizing Provider  amLODipine (NORVASC) 5 MG tablet Take 1 tablet (5 mg total) by mouth daily. Patient not taking: Reported on 04/07/2016 11/30/15   Renae Fickle, MD  carvedilol (COREG) 25 MG tablet Take 1 tablet (25 mg total) by mouth 2 (two) times daily with a meal. Patient not taking: Reported on 04/07/2016 11/30/15   Renae Fickle, MD  ferrous sulfate 325 (65 FE) MG tablet Take 1 tablet (325 mg total) by mouth 2 (two) times daily with a meal. Patient not taking: Reported on 04/07/2016 12/04/15   Swords, Valetta Mole, MD  lisinopril (PRINIVIL,ZESTRIL) 10 MG tablet Take 1 tablet (10 mg total) by mouth daily. Patient not taking: Reported on 04/07/2016 11/30/15   Renae Fickle, MD  LORazepam (ATIVAN) 1 MG tablet Take 1 tablet (1 mg total) by mouth every 6 (six) hours as needed for anxiety. Patient not taking: Reported on 04/07/2016 11/30/15   Renae Fickle, MD  metoCLOPramide (REGLAN) 10 MG tablet Take 1 tablet (10 mg total)  by mouth every 6 (six) hours as needed for nausea (nausea/headache). 03/01/22   Charlynne Pander, MD  ondansetron (ZOFRAN ODT) 4 MG disintegrating tablet Take 1 tablet (4 mg total) by mouth every 8 (eight) hours as needed for nausea or vomiting. Patient not taking: Reported on 04/07/2016 11/30/15   Renae Fickle, MD  pantoprazole (PROTONIX) 40 MG tablet Take 1 tablet (40 mg total) by mouth 2 (two) times daily before a meal. Patient not taking: Reported on 12/04/2015 11/30/15   Renae Fickle, MD  sucralfate (CARAFATE) 1 GM/10ML suspension Take 10 mLs (1 g total) by mouth 4 (four) times daily -  with meals and at bedtime. Patient not taking: Reported on 12/04/2015 11/30/15   Renae Fickle, MD  traMADol (ULTRAM) 50 MG tablet Take 1 tablet (50 mg total) by mouth every 6 (six) hours as needed for severe pain. Patient not taking: Reported on 04/07/2016 11/30/15   Renae Fickle, MD      Allergies    Patient has no known allergies.    Review of Systems   Review of Systems  Constitutional:  Positive for diaphoresis. Negative for chills and fever.  HENT:  Negative for congestion, ear pain and sore throat.   Eyes:  Negative for pain and visual disturbance.  Respiratory:  Positive for shortness of breath. Negative for cough, chest tightness and wheezing.   Cardiovascular:  Positive for chest pain. Negative for palpitations and near-syncope.  Gastrointestinal:  Negative for abdominal pain,  constipation, diarrhea, nausea and vomiting.  Genitourinary:  Negative for dysuria, flank pain and hematuria.  Musculoskeletal:  Negative for arthralgias and back pain.  Skin:  Negative for color change and rash.  Neurological:  Negative for dizziness, seizures, syncope, weakness, light-headedness and headaches.  Psychiatric/Behavioral:  Negative for agitation.   All other systems reviewed and are negative.   Physical Exam Updated Vital Signs BP (!) 181/114 (BP Location: Left Arm)   Pulse 95   Temp 99.6 F  (37.6 C) (Oral)   Resp 18   SpO2 100%  Physical Exam Vitals and nursing note reviewed.  Constitutional:      General: She is not in acute distress.    Appearance: She is well-developed. She is not ill-appearing, toxic-appearing or diaphoretic.  HENT:     Head: Normocephalic and atraumatic.  Eyes:     Conjunctiva/sclera: Conjunctivae normal.     Pupils: Pupils are equal, round, and reactive to light.  Cardiovascular:     Rate and Rhythm: Normal rate and regular rhythm.     Heart sounds: No murmur heard. Pulmonary:     Effort: Pulmonary effort is normal. No respiratory distress.     Breath sounds: Normal breath sounds. No wheezing, rhonchi or rales.  Chest:     Chest wall: Tenderness present.  Abdominal:     Palpations: Abdomen is soft.     Tenderness: There is no abdominal tenderness.  Musculoskeletal:        General: No swelling.     Cervical back: Neck supple.     Right lower leg: No tenderness. No edema.     Left lower leg: No tenderness. No edema.  Skin:    General: Skin is warm and dry.     Capillary Refill: Capillary refill takes less than 2 seconds.  Neurological:     General: No focal deficit present.     Mental Status: She is alert.  Psychiatric:        Mood and Affect: Mood normal.     ED Results / Procedures / Treatments   Labs (all labs ordered are listed, but only abnormal results are displayed) Labs Reviewed  CBC - Abnormal; Notable for the following components:      Result Value   WBC 11.0 (*)    MCV 79.6 (*)    RDW 15.9 (*)    All other components within normal limits  I-STAT BETA HCG BLOOD, ED ([facility], [facility], [facility] ONLY) - Abnormal; Notable for the following components:   I-stat hCG, quantitative 6.3 (*)    All other components within normal limits  LIPASE, BLOOD  D-DIMER, QUANTITATIVE  HCG, SERUM, QUALITATIVE  BASIC METABOLIC PANEL  URINALYSIS, ROUTINE W REFLEX MICROSCOPIC  HEPATIC FUNCTION PANEL  TROPONIN I (HIGH SENSITIVITY)  TROPONIN I (HIGH  SENSITIVITY)    EKG EKG Interpretation  Date/Time:  Friday March 20 2022 12:16:01 EDT Ventricular Rate:  93 PR Interval:  160 QRS Duration: 84 QT Interval:  367 QTC Calculation: 457 R Axis:   15 Text Interpretation: Sinus rhythm Consider left ventricular hypertrophy Nonspecific T abnormalities, lateral leads ST elev, probable normal early repol pattern when compared to prior, similar appearance. No STEMI Confirmed by Theda Belfast ([id number]) on 03/20/2022 12:30:16 PM  Radiology DG Chest 2 View  Result Date: 03/20/2022 CLINICAL DATA:  Chest pain EXAM: CHEST - 2 VIEW COMPARISON:  03/01/2022 FINDINGS: The heart size and mediastinal contours are within normal limits. Both lungs are clear. The visualized skeletal structures are unremarkable. IMPRESSION: No  active cardiopulmonary disease. Electronically Signed   By: Duanne Guess D.O.   On: 03/20/2022 13:38    Procedures Procedures    Medications Ordered in ED Medications  acetaminophen (TYLENOL) tablet 650 mg (650 mg Oral Given 03/20/22 1249)    ED Course/ Medical Decision Making/ A&P                           Medical Decision Making Amount and/or Complexity of Data Reviewed Labs: ordered. Radiology: ordered.    TIHANNA GOODSON is a 56 y.o. female with a past medical history significant for hypertension and previous anemia who presents with lightheadedness, chest pain, shortness of breath.  Patient reports that she was at work today when she started having a stabbing right chest pain that was severe.  It was 10 out of 10 in severity.  She reports associated with shortness of breath and lightheadedness.  She reports it was not exertional and did not radiate.  She reports no nausea, vomiting and did not pass out.  She reports she was sweaty.  She reports no trauma.  Denies any leg pain or leg swelling and has no history of DVT or PE.  She denies constipation, diarrhea, or urinary symptoms.  She reports a history of musculoskeletal chest  wall pain that was more severe in the past.  Denies any other complaints.  On exam, lungs were clear.  Chest is tender to palpation on the right central chest.  No murmur.  Good pulses in extremities.  Legs nontender and nonedematous.  Abdomen and back nontender.  Flanks nontender.  Patient well-appearing.  EKG does not show STEMI.  Patient will have work-up to rule out concerning etiology of symptoms.  She did get nitro and aspirin that caused her to have worsened headache but otherwise also improved some of her discomfort.  We will get chest x-ray, labs, and look for other causes of symptoms.  If work-up is reassuring, I suspect may be more musculoskeletal given her history of the same and tenderness to palpation.  Anticipate reassessment or work-up.  Care transferred to oncoming team to await results of work-up.  Anticipate discharge for likely musculoskeletal chest wall pain if work-up is reassuring.         Final Clinical Impression(s) / ED Diagnoses Final diagnoses:  Atypical chest pain    Clinical Impression: 1. Atypical chest pain     Disposition: Care transferred to oncoming team to await results of work-up.  Anticipate discharge for likely musculoskeletal chest wall pain if work-up is reassuring.  This note was prepared with assistance of Conservation officer, historic buildings. Occasional wrong-word or sound-a-like substitutions may have occurred due to the inherent limitations of voice recognition software.       Jim Philemon, Canary Brim, MD 03/20/22 1515

## 2022-03-20 NOTE — Discharge Instructions (Addendum)
You need a family doctor.  They can help take care of all these problems for you.  They need to keep track of your blood pressure.  Every time its been measured in the emergency department its been very high.  If this goes unchecked for long periods of time can increase your risk of having strokes heart attacks kidney disease or nerve damage.  Looks like you have been on amlodipine before, I will represcribe this for you.  Your work-up here was unlikely to be a heart attack.  Unlikely to be a blood clot in the lung.  I have prescribed you a gel that you can rub right where it hurts.
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
## 2022-04-25 ENCOUNTER — Emergency Department (HOSPITAL_COMMUNITY): Payer: Self-pay

## 2022-04-25 ENCOUNTER — Observation Stay (HOSPITAL_COMMUNITY): Payer: Self-pay

## 2022-04-25 ENCOUNTER — Inpatient Hospital Stay (HOSPITAL_COMMUNITY)
Admission: EM | Admit: 2022-04-25 | Discharge: 2022-05-01 | DRG: 605 | Disposition: A | Discharge status: Home / Self Care | Payer: Self-pay | Attending: Family Medicine | Admitting: Family Medicine

## 2022-04-25 ENCOUNTER — Other Ambulatory Visit: Payer: Self-pay

## 2022-04-25 ENCOUNTER — Encounter (HOSPITAL_COMMUNITY): Payer: Self-pay | Admitting: Emergency Medicine

## 2022-04-25 DIAGNOSIS — R4182 Altered mental status, unspecified: Secondary | ICD-10-CM

## 2022-04-25 DIAGNOSIS — R519 Headache, unspecified: Secondary | ICD-10-CM | POA: Diagnosis not present

## 2022-04-25 DIAGNOSIS — M21242 Flexion deformity, left finger joints: Secondary | ICD-10-CM | POA: Diagnosis present

## 2022-04-25 DIAGNOSIS — Z79899 Other long term (current) drug therapy: Secondary | ICD-10-CM

## 2022-04-25 DIAGNOSIS — Z8249 Family history of ischemic heart disease and other diseases of the circulatory system: Secondary | ICD-10-CM

## 2022-04-25 DIAGNOSIS — Z20822 Contact with and (suspected) exposure to covid-19: Secondary | ICD-10-CM | POA: Diagnosis present

## 2022-04-25 DIAGNOSIS — S8991XA Unspecified injury of right lower leg, initial encounter: Secondary | ICD-10-CM | POA: Diagnosis present

## 2022-04-25 DIAGNOSIS — S61451A Open bite of right hand, initial encounter: Secondary | ICD-10-CM | POA: Diagnosis present

## 2022-04-25 DIAGNOSIS — I10 Essential (primary) hypertension: Secondary | ICD-10-CM

## 2022-04-25 DIAGNOSIS — S060X9A Concussion with loss of consciousness of unspecified duration, initial encounter: Secondary | ICD-10-CM

## 2022-04-25 DIAGNOSIS — Z23 Encounter for immunization: Secondary | ICD-10-CM

## 2022-04-25 DIAGNOSIS — S0181XA Laceration without foreign body of other part of head, initial encounter: Secondary | ICD-10-CM | POA: Diagnosis present

## 2022-04-25 DIAGNOSIS — S8001XA Contusion of right knee, initial encounter: Secondary | ICD-10-CM | POA: Diagnosis present

## 2022-04-25 DIAGNOSIS — F1090 Alcohol use, unspecified, uncomplicated: Secondary | ICD-10-CM | POA: Diagnosis present

## 2022-04-25 DIAGNOSIS — F419 Anxiety disorder, unspecified: Secondary | ICD-10-CM | POA: Diagnosis present

## 2022-04-25 DIAGNOSIS — F515 Nightmare disorder: Secondary | ICD-10-CM | POA: Diagnosis present

## 2022-04-25 DIAGNOSIS — I160 Hypertensive urgency: Secondary | ICD-10-CM | POA: Diagnosis present

## 2022-04-25 DIAGNOSIS — F17210 Nicotine dependence, cigarettes, uncomplicated: Secondary | ICD-10-CM | POA: Diagnosis present

## 2022-04-25 DIAGNOSIS — T1490XA Injury, unspecified, initial encounter: Secondary | ICD-10-CM | POA: Diagnosis present

## 2022-04-25 DIAGNOSIS — Y92009 Unspecified place in unspecified non-institutional (private) residence as the place of occurrence of the external cause: Secondary | ICD-10-CM

## 2022-04-25 DIAGNOSIS — T07XXXA Unspecified multiple injuries, initial encounter: Secondary | ICD-10-CM

## 2022-04-25 DIAGNOSIS — F329 Major depressive disorder, single episode, unspecified: Secondary | ICD-10-CM | POA: Diagnosis present

## 2022-04-25 DIAGNOSIS — E876 Hypokalemia: Secondary | ICD-10-CM | POA: Diagnosis present

## 2022-04-25 DIAGNOSIS — G479 Sleep disorder, unspecified: Secondary | ICD-10-CM | POA: Diagnosis present

## 2022-04-25 DIAGNOSIS — S61452A Open bite of left hand, initial encounter: Principal | ICD-10-CM | POA: Diagnosis present

## 2022-04-25 DIAGNOSIS — Z720 Tobacco use: Secondary | ICD-10-CM | POA: Diagnosis present

## 2022-04-25 DIAGNOSIS — Z9151 Personal history of suicidal behavior: Secondary | ICD-10-CM

## 2022-04-25 DIAGNOSIS — Y904 Blood alcohol level of 80-99 mg/100 ml: Secondary | ICD-10-CM | POA: Diagnosis present

## 2022-04-25 DIAGNOSIS — S3993XA Unspecified injury of pelvis, initial encounter: Secondary | ICD-10-CM | POA: Diagnosis present

## 2022-04-25 LAB — COMPREHENSIVE METABOLIC PANEL
ALT: 16 U/L (ref 0–44)
AST: 30 U/L (ref 15–41)
Albumin: 3.8 g/dL (ref 3.5–5.0)
Alkaline Phosphatase: 81 U/L (ref 38–126)
Anion gap: 14 (ref 5–15)
BUN: 6 mg/dL (ref 6–20)
CO2: 20 mmol/L — ABNORMAL LOW (ref 22–32)
Calcium: 9.6 mg/dL (ref 8.9–10.3)
Chloride: 109 mmol/L (ref 98–111)
Creatinine, Ser: 0.93 mg/dL (ref 0.44–1.00)
GFR, Estimated: >60 mL/min (ref >60)
Glucose, Bld: 90 mg/dL (ref 70–99)
Potassium: 3.3 mmol/L — ABNORMAL LOW (ref 3.5–5.1)
Sodium: 143 mmol/L (ref 135–145)
Total Bilirubin: 0.4 mg/dL (ref 0.3–1.2)
Total Protein: 7.4 g/dL (ref 6.5–8.1)

## 2022-04-25 LAB — I-STAT CHEM 8, ED
BUN: 7 mg/dL (ref 6–20)
Calcium, Ion: 1.14 mmol/L — ABNORMAL LOW (ref 1.15–1.40)
Chloride: 108 mmol/L (ref 98–111)
Creatinine, Ser: 0.9 mg/dL (ref 0.44–1.00)
Glucose, Bld: 87 mg/dL (ref 70–99)
HCT: 40 % (ref 36.0–46.0)
Hemoglobin: 13.6 g/dL (ref 12.0–15.0)
Potassium: 3.3 mmol/L — ABNORMAL LOW (ref 3.5–5.1)
Sodium: 145 mmol/L (ref 135–145)
TCO2: 20 mmol/L — ABNORMAL LOW (ref 22–32)

## 2022-04-25 LAB — CBC
HCT: 36.2 % (ref 36.0–46.0)
Hemoglobin: 12 g/dL (ref 12.0–15.0)
MCH: 26.9 pg (ref 26.0–34.0)
MCHC: 33.1 g/dL (ref 30.0–36.0)
MCV: 81.2 fL (ref 80.0–100.0)
Platelets: 293 10*3/uL (ref 150–400)
RBC: 4.46 MIL/uL (ref 3.87–5.11)
RDW: 16.2 % — ABNORMAL HIGH (ref 11.5–15.5)
WBC: 10.7 10*3/uL — ABNORMAL HIGH (ref 4.0–10.5)
nRBC: 0 % (ref 0.0–0.2)

## 2022-04-25 LAB — I-STAT BETA HCG BLOOD, ED (MC, WL, AP ONLY): I-stat hCG, quantitative: 7.6 m[IU]/mL — ABNORMAL HIGH (ref <5)

## 2022-04-25 LAB — SAMPLE TO BLOOD BANK

## 2022-04-25 LAB — RESP PANEL BY RT-PCR (FLU A&B, COVID) ARPGX2
Influenza A by PCR: NEGATIVE
Influenza B by PCR: NEGATIVE
SARS Coronavirus 2 by RT PCR: NEGATIVE

## 2022-04-25 LAB — URINALYSIS, ROUTINE W REFLEX MICROSCOPIC
Bacteria, UA: NONE SEEN
Bilirubin Urine: NEGATIVE
Glucose, UA: NEGATIVE mg/dL
Ketones, ur: NEGATIVE mg/dL
Leukocytes,Ua: NEGATIVE
Nitrite: NEGATIVE
Protein, ur: NEGATIVE mg/dL
Specific Gravity, Urine: 1.014 (ref 1.005–1.030)
pH: 8 (ref 5.0–8.0)

## 2022-04-25 LAB — PROTIME-INR
INR: 1 (ref 0.8–1.2)
Prothrombin Time: 13.2 seconds (ref 11.4–15.2)

## 2022-04-25 LAB — LACTIC ACID, PLASMA
Lactic Acid, Venous: 4.8 mmol/L (ref 0.5–1.9)
Lactic Acid, Venous: 4.2 mmol/L (ref 0.5–1.9)

## 2022-04-25 LAB — ETHANOL: Alcohol, Ethyl (B): 93 mg/dL — ABNORMAL HIGH (ref <10)

## 2022-04-25 MED ORDER — FENTANYL CITRATE (PF) 100 MCG/2ML IJ SOLN
INTRAMUSCULAR | Status: AC
  Administered 2022-04-25: 100 ug
  Filled 2022-04-25: qty 2

## 2022-04-25 MED ORDER — TETANUS-DIPHTH-ACELL PERTUSSIS 5-2.5-18.5 LF-MCG/0.5 IM SUSY
0.5000 mL | PREFILLED_SYRINGE | Freq: Once | INTRAMUSCULAR | Status: AC
  Administered 2022-04-25: 0.5 mL via INTRAMUSCULAR
  Filled 2022-04-25: qty 0.5

## 2022-04-25 MED ORDER — AMLODIPINE BESYLATE 5 MG PO TABS
5.0000 mg | ORAL_TABLET | Freq: Once | ORAL | Status: AC
Start: 2022-04-25 — End: 2022-04-25
  Administered 2022-04-25: 5 mg via ORAL
  Filled 2022-04-25: qty 1

## 2022-04-25 MED ORDER — LACTATED RINGERS IV BOLUS
1000.0000 mL | Freq: Once | INTRAVENOUS | Status: AC
  Administered 2022-04-25: 1000 mL via INTRAVENOUS

## 2022-04-25 MED ORDER — LACTATED RINGERS IV BOLUS
1000.0000 mL | Freq: Once | INTRAVENOUS | Status: AC
Start: 2022-04-25 — End: 2022-04-25
  Administered 2022-04-25: 1000 mL via INTRAVENOUS

## 2022-04-25 MED ORDER — IOHEXOL 350 MG/ML SOLN
100.0000 mL | Freq: Once | INTRAVENOUS | Status: AC | PRN
  Administered 2022-04-25: 100 mL via INTRAVENOUS

## 2022-04-25 MED ORDER — CARVEDILOL 12.5 MG PO TABS
25.0000 mg | ORAL_TABLET | Freq: Once | ORAL | Status: AC
  Administered 2022-04-25: 25 mg via ORAL
  Filled 2022-04-25: qty 2

## 2022-04-25 MED ORDER — NICOTINE 14 MG/24HR TD PT24
14.0000 mg | MEDICATED_PATCH | Freq: Every day | TRANSDERMAL | Status: DC
  Administered 2022-04-25 – 2022-04-30 (×6): 14 mg via TRANSDERMAL
  Filled 2022-04-25 (×9): qty 1

## 2022-04-25 MED ORDER — MORPHINE SULFATE (PF) 2 MG/ML IV SOLN
1.0000 mg | INTRAVENOUS | Status: DC | PRN
  Administered 2022-04-25 – 2022-04-29 (×15): 1 mg via INTRAVENOUS
  Filled 2022-04-25 (×16): qty 1

## 2022-04-25 MED ORDER — LACTATED RINGERS IV BOLUS: 1000.0000 mL | Freq: Once | INTRAVENOUS | 0 refills | Status: AC

## 2022-04-25 MED ORDER — AMLODIPINE BESYLATE 5 MG PO TABS: 5.0000 mg | ORAL_TABLET | Freq: Every day | ORAL | Status: DC

## 2022-04-25 MED ORDER — LIDOCAINE-EPINEPHRINE-TETRACAINE (LET) TOPICAL GEL
3.0000 mL | Freq: Once | TOPICAL | Status: AC
  Administered 2022-04-25: 3 mL via TOPICAL
  Filled 2022-04-25: qty 3

## 2022-04-25 MED ORDER — ACETAMINOPHEN 500 MG PO TABS
1000.0000 mg | ORAL_TABLET | Freq: Four times a day (QID) | ORAL | Status: DC
  Administered 2022-04-25 – 2022-05-01 (×20): 1000 mg via ORAL
  Filled 2022-04-25 (×23): qty 2

## 2022-04-25 MED ORDER — ENOXAPARIN SODIUM 40 MG/0.4ML IJ SOSY
40.0000 mg | PREFILLED_SYRINGE | INTRAMUSCULAR | Status: DC
  Administered 2022-04-26 – 2022-04-30 (×5): 40 mg via SUBCUTANEOUS
  Filled 2022-04-25 (×8): qty 0.4

## 2022-04-25 MED ORDER — LORAZEPAM 2 MG/ML IJ SOLN
1.0000 mg | Freq: Once | INTRAMUSCULAR | Status: AC
  Administered 2022-04-25: 1 mg via INTRAVENOUS
  Filled 2022-04-25: qty 1

## 2022-04-25 MED ORDER — POTASSIUM CHLORIDE CRYS ER 20 MEQ PO TBCR
40.0000 meq | EXTENDED_RELEASE_TABLET | Freq: Once | ORAL | Status: AC
Start: 2022-04-25 — End: 2022-04-25
  Administered 2022-04-25: 40 meq via ORAL
  Filled 2022-04-25: qty 2
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
## 2022-04-25 NOTE — Progress Notes (Signed)
FMTS Brief Progress Note Went to see patient briefly prior to transfer to night team. Stable condition, hypertensive at bedside. Handoff to night team for further admission and treatment. Temporary admission order placed.    O: BP (!) 180/118   Pulse 87   Temp 98.1 F (36.7 C) (Temporal)   Resp 18   Ht 5\' 6"  (1.676 m)   Wt 56.7 kg   SpO2 98%   BMI 20.18 kg/m     , MD 04/25/2022, 6:53 PM PGY-2, [facility] Night Resident  Please page [phone] with questions.

## 2022-04-25 NOTE — Assessment & Plan Note (Addendum)
BP at goal of <140/90 - Cont Amlodipine to 10mg  daily - Monitor BP - cont 25 mg Losartan
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
## 2022-04-25 NOTE — ED Notes (Signed)
Family updated as to patient's status.Patients son Ortencia Kick is trying to find a ride to get here.
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
## 2022-04-25 NOTE — Assessment & Plan Note (Signed)
K 3.3 on admission. -Replete with PO potassium -am CMP
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
## 2022-04-25 NOTE — ED Notes (Signed)
Patients bed and gown changed. Patient resting comfortably.
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
## 2022-04-25 NOTE — Assessment & Plan Note (Signed)
-  Nicotine patch 14mg  daily

## 2022-04-25 NOTE — ED Notes (Signed)
Urine 
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
## 2022-04-26 ENCOUNTER — Ambulatory Visit (HOSPITAL_COMMUNITY)
Admission: EM | Admit: 2022-04-26 | Discharge: 2022-04-26 | Disposition: A | Discharge status: Home / Self Care | Payer: No Typology Code available for payment source | Source: Ambulatory Visit | Attending: Emergency Medicine | Admitting: Emergency Medicine

## 2022-04-26 LAB — CBC
HCT: 36.2 % (ref 36.0–46.0)
Hemoglobin: 12.3 g/dL (ref 12.0–15.0)
MCH: 26.7 pg (ref 26.0–34.0)
MCHC: 34 g/dL (ref 30.0–36.0)
MCV: 78.7 fL — ABNORMAL LOW (ref 80.0–100.0)
Platelets: 286 10*3/uL (ref 150–400)
RBC: 4.6 MIL/uL (ref 3.87–5.11)
RDW: 16.1 % — ABNORMAL HIGH (ref 11.5–15.5)
WBC: 12.4 10*3/uL — ABNORMAL HIGH (ref 4.0–10.5)
nRBC: 0 % (ref 0.0–0.2)

## 2022-04-26 LAB — COMPREHENSIVE METABOLIC PANEL
ALT: 16 U/L (ref 0–44)
AST: 30 U/L (ref 15–41)
Albumin: 3.8 g/dL (ref 3.5–5.0)
Alkaline Phosphatase: 83 U/L (ref 38–126)
Anion gap: 11 (ref 5–15)
BUN: 9 mg/dL (ref 6–20)
CO2: 24 mmol/L (ref 22–32)
Calcium: 9.3 mg/dL (ref 8.9–10.3)
Chloride: 102 mmol/L (ref 98–111)
Creatinine, Ser: 0.85 mg/dL (ref 0.44–1.00)
GFR, Estimated: >60 mL/min (ref >60)
Glucose, Bld: 107 mg/dL — ABNORMAL HIGH (ref 70–99)
Potassium: 4 mmol/L (ref 3.5–5.1)
Sodium: 137 mmol/L (ref 135–145)
Total Bilirubin: 0.5 mg/dL (ref 0.3–1.2)
Total Protein: 7.3 g/dL (ref 6.5–8.1)

## 2022-04-26 LAB — LIPID PANEL
Cholesterol: 196 mg/dL (ref 0–200)
HDL: 55 mg/dL (ref >40)
LDL Cholesterol: 112 mg/dL — ABNORMAL HIGH (ref 0–99)
Total CHOL/HDL Ratio: 3.6 RATIO
Triglycerides: 143 mg/dL (ref <150)
VLDL: 29 mg/dL (ref 0–40)

## 2022-04-26 LAB — HEMOGLOBIN A1C
Hgb A1c MFr Bld: 5.7 % — ABNORMAL HIGH (ref 4.8–5.6)
Mean Plasma Glucose: 116.89 mg/dL

## 2022-04-26 LAB — HIV ANTIBODY (ROUTINE TESTING W REFLEX): HIV Screen 4th Generation wRfx: NONREACTIVE

## 2022-04-26 MED ORDER — AMOXICILLIN-POT CLAVULANATE 875-125 MG PO TABS
1.0000 | ORAL_TABLET | Freq: Two times a day (BID) | ORAL | Status: AC
  Administered 2022-04-26 – 2022-04-30 (×10): 1 via ORAL
  Filled 2022-04-26 (×10): qty 1

## 2022-04-26 MED ORDER — LORAZEPAM 1 MG PO TABS
1.0000 mg | ORAL_TABLET | Freq: Once | ORAL | Status: AC
Start: 2022-04-26 — End: 2022-04-26
  Administered 2022-04-26: 1 mg via ORAL
  Filled 2022-04-26: qty 1

## 2022-04-26 MED ORDER — AMLODIPINE BESYLATE 10 MG PO TABS
10.0000 mg | ORAL_TABLET | Freq: Every day | ORAL | Status: DC
  Administered 2022-04-26 – 2022-04-30 (×5): 10 mg via ORAL
  Filled 2022-04-26 (×8): qty 1
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
## 2022-04-26 NOTE — Progress Notes (Signed)
OT Cancellation Note  Patient Details Name: RYIAH BELLISSIMO MRN: [id number] DOB: 26-May-1966   Cancelled Treatment:    Reason Eval/Treat Not Completed: Other (comment) MD at bedside with pt who is tearful and upset that her earrings where unable to be found in ED. Per MD, not a good time to initiate OT eval. Will follow up as schedule permits today vs tomorrow.   Lorre Munroe 04/26/2022, 1:19 PM
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
## 2022-04-27 DIAGNOSIS — S060X9A Concussion with loss of consciousness of unspecified duration, initial encounter: Secondary | ICD-10-CM

## 2022-04-27 DIAGNOSIS — T1490XA Injury, unspecified, initial encounter: Secondary | ICD-10-CM

## 2022-04-27 LAB — BASIC METABOLIC PANEL
Anion gap: 9 (ref 5–15)
BUN: 12 mg/dL (ref 6–20)
CO2: 24 mmol/L (ref 22–32)
Calcium: 9.3 mg/dL (ref 8.9–10.3)
Chloride: 105 mmol/L (ref 98–111)
Creatinine, Ser: 0.84 mg/dL (ref 0.44–1.00)
GFR, Estimated: >60 mL/min (ref >60)
Glucose, Bld: 93 mg/dL (ref 70–99)
Potassium: 4 mmol/L (ref 3.5–5.1)
Sodium: 138 mmol/L (ref 135–145)

## 2022-04-27 LAB — CBC
HCT: 37.1 % (ref 36.0–46.0)
Hemoglobin: 12.4 g/dL (ref 12.0–15.0)
MCH: 26.5 pg (ref 26.0–34.0)
MCHC: 33.4 g/dL (ref 30.0–36.0)
MCV: 79.3 fL — ABNORMAL LOW (ref 80.0–100.0)
Platelets: 275 10*3/uL (ref 150–400)
RBC: 4.68 MIL/uL (ref 3.87–5.11)
RDW: 16 % — ABNORMAL HIGH (ref 11.5–15.5)
WBC: 9.1 10*3/uL (ref 4.0–10.5)
nRBC: 0 % (ref 0.0–0.2)

## 2022-04-27 MED ORDER — IBUPROFEN 400 MG PO TABS: 400.0000 mg | ORAL_TABLET | Freq: Once | ORAL | Status: DC | PRN

## 2022-04-27 MED ORDER — PRAZOSIN HCL 1 MG PO CAPS
1.0000 mg | ORAL_CAPSULE | Freq: Every day | ORAL | Status: DC
  Administered 2022-04-27 – 2022-04-30 (×4): 1 mg via ORAL
  Filled 2022-04-27 (×5): qty 1

## 2022-04-27 MED ORDER — ONDANSETRON HCL 4 MG/2ML IJ SOLN
4.0000 mg | Freq: Once | INTRAMUSCULAR | Status: AC
Start: 2022-04-27 — End: 2022-04-27
  Administered 2022-04-27: 4 mg via INTRAVENOUS
  Filled 2022-04-27: qty 2

## 2022-04-27 MED ORDER — QUETIAPINE FUMARATE 50 MG PO TABS
25.0000 mg | ORAL_TABLET | Freq: Two times a day (BID) | ORAL | Status: DC
  Administered 2022-04-27 – 2022-04-30 (×7): 25 mg via ORAL
  Filled 2022-04-27 (×9): qty 1

## 2022-04-27 MED ORDER — MIRTAZAPINE 15 MG PO TABS
7.5000 mg | ORAL_TABLET | Freq: Every day | ORAL | Status: DC
  Administered 2022-04-27 – 2022-04-30 (×4): 7.5 mg via ORAL
  Filled 2022-04-27 (×4): qty 1

## 2022-04-27 MED ORDER — LORAZEPAM 2 MG/ML IJ SOLN
0.5000 mg | Freq: Once | INTRAMUSCULAR | Status: AC
  Administered 2022-04-27: 0.5 mg via INTRAVENOUS
  Filled 2022-04-27: qty 1

## 2022-04-27 MED ORDER — LORAZEPAM 0.5 MG PO TABS
0.5000 mg | ORAL_TABLET | Freq: Once | ORAL | Status: AC
Start: 2022-04-27 — End: 2022-04-27
  Administered 2022-04-27: 0.5 mg via ORAL
  Filled 2022-04-27: qty 1
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
## 2022-04-27 NOTE — Progress Notes (Signed)
Security officer Yaakov Guthrie came up and spoke with pt about her concerns. Once she has been discharged from the hospital, she can contact [employer]. Pt is private.

## 2022-04-27 NOTE — Progress Notes (Signed)
PT Cancellation Note  Patient Details Name: Leslie Golden MRN: [id number] DOB: 1966-02-08   Cancelled Treatment:    Reason Eval/Treat Not Completed: Patient declined, no reason specified. Pt reporting she is hurting and is "not myself today". She reports she has had a very bad day due to difficulty sleeping with bad nightmares. Pt requesting not to do PT at this time, but agreeable with PT checking back later if time permits.   Raymond Gurney, PT, DPT Acute Rehabilitation Services  Office: [phone]    [name] 04/27/2022, 1:42 PM

## 2022-04-27 NOTE — Social Work (Signed)
Pt is overwhelmed and would like for CSW to follow up tomorrow. CSW has already reached out to [facility] of the [facility] shelter, they are closed today. CSW will follow up with them tomorrow as  well.

## 2022-04-27 NOTE — Progress Notes (Signed)
Attempted to see patient about d/c plans, HH and DME. She is very tearful and asked that we discuss this tomorrow. She says she is not in a frame of mind to talk today. CM will revisit tomorrow.
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
## 2022-04-28 DIAGNOSIS — T07XXXA Unspecified multiple injuries, initial encounter: Secondary | ICD-10-CM

## 2022-04-28 DIAGNOSIS — R4182 Altered mental status, unspecified: Secondary | ICD-10-CM

## 2022-04-28 DIAGNOSIS — I10 Essential (primary) hypertension: Secondary | ICD-10-CM

## 2022-04-28 DIAGNOSIS — S060X9D Concussion with loss of consciousness of unspecified duration, subsequent encounter: Secondary | ICD-10-CM

## 2022-04-28 MED ORDER — LOSARTAN POTASSIUM 25 MG PO TABS
25.0000 mg | ORAL_TABLET | Freq: Every day | ORAL | Status: DC
Start: 2022-04-28 — End: 2022-05-01
  Administered 2022-04-28 – 2022-04-30 (×3): 25 mg via ORAL
  Filled 2022-04-28 (×5): qty 1
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
## 2022-04-28 NOTE — Progress Notes (Signed)
Mobility Specialist - Progress Note   04/28/22 1600  Mobility  Activity Refused mobility      Pt in bed and hesitant to go for a walk. Stated she wanted to wait for her next dose of pain medication before ambulating. Also stated she feels fatigued. Asked to participate tomorrow morning instead. Will follow up as time permits.   Lewie Loron Mobility Specialist
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
## 2022-04-28 NOTE — Progress Notes (Signed)
   04/28/22 1640  Clinical Encounter Type  Visited With Patient not available  Visit Type Initial;Spiritual support  Referral From Nurse  Consult/Referral To Chaplain   Chaplain attempted to respond to a spiritual consult for prayer. The patient was receiving care at the time of my visit. I will attempt at another time.   Valerie Roys [facility]  [phone]

## 2022-04-28 NOTE — Progress Notes (Signed)
PT Cancellation Note  Patient Details Name: Leslie Golden MRN: [id number] DOB: 15-Jan-1966   Cancelled Treatment:    Reason Eval/Treat Not Completed: Pain limiting ability to participate;Fatigue/lethargy limiting ability to participate; patient reports waiting on pain meds and long day working on options for safe placement at d/c.  Attempted to interview a little for vestibular eval, but she was evasive though did endorse both light headedness and spinning.  PT to return to continue working on vestibular assessment and treatment.    Elray Mcgregor 04/28/2022, 4:46 PM Sheran Lawless, PT Acute Rehabilitation Services Office:[phone] 04/28/2022
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
## 2022-04-29 MED ORDER — OXYCODONE HCL 5 MG PO TABS
5.0000 mg | ORAL_TABLET | ORAL | Status: DC | PRN
  Administered 2022-04-29 – 2022-04-30 (×3): 5 mg via ORAL
  Filled 2022-04-29 (×4): qty 1
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
## 2022-04-29 NOTE — Progress Notes (Signed)
Mobility Specialist - Progress Note   04/29/22 1500  Mobility  Activity Refused mobility  $Mobility charge 1 Mobility    Pt in bed. Explained that she is "not in the mood" and that she needs time to get her thoughts together. Politely asked me to come back tomorrow. Will follow up as time allows. Left in bed w/ call bell in reach and all needs met.   Paulla Dolly Mobility Specialist
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
## 2022-04-30 LAB — BASIC METABOLIC PANEL
Anion gap: 8 (ref 5–15)
BUN: 18 mg/dL (ref 6–20)
CO2: 26 mmol/L (ref 22–32)
Calcium: 9.8 mg/dL (ref 8.9–10.3)
Chloride: 106 mmol/L (ref 98–111)
Creatinine, Ser: 0.93 mg/dL (ref 0.44–1.00)
GFR, Estimated: >60 mL/min (ref >60)
Glucose, Bld: 92 mg/dL (ref 70–99)
Potassium: 4.1 mmol/L (ref 3.5–5.1)
Sodium: 140 mmol/L (ref 135–145)

## 2022-04-30 MED ORDER — LORAZEPAM 1 MG PO TABS
1.0000 mg | ORAL_TABLET | Freq: Once | ORAL | Status: AC | PRN
  Administered 2022-04-30: 1 mg via ORAL
  Filled 2022-04-30: qty 1

## 2022-04-30 MED ORDER — OXYCODONE HCL 5 MG PO TABS
5.0000 mg | ORAL_TABLET | ORAL | Status: DC
  Administered 2022-04-30: 5 mg via ORAL
  Filled 2022-04-30 (×2): qty 1

## 2022-04-30 MED ORDER — OXYCODONE HCL 5 MG PO TABS
5.0000 mg | ORAL_TABLET | ORAL | Status: DC
  Administered 2022-04-30 – 2022-05-01 (×3): 5 mg via ORAL
  Filled 2022-04-30 (×5): qty 1

## 2022-04-30 MED ORDER — GABAPENTIN 100 MG PO CAPS
100.0000 mg | ORAL_CAPSULE | Freq: Every day | ORAL | Status: DC
  Administered 2022-04-30: 100 mg via ORAL
  Filled 2022-04-30: qty 1

## 2022-04-30 NOTE — Progress Notes (Signed)
PT Cancellation Note  Patient Details Name: Leslie Golden MRN: [id number] DOB: May 06, 1966   Cancelled Treatment:    Reason Eval/Treat Not Completed: Patient declined, no reason specified. Pt with visitor currently and requesting PT re-attempt later if time permits.   Raymond Gurney, PT, DPT Acute Rehabilitation Services  Office: [phone]    [name] 04/30/2022, 10:47 AM
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
## 2022-04-30 NOTE — Social Work (Signed)
CSW went to speak with pt about DC plan but pt was visibly upset and stated a  lot of things were triggering her. CSW listened to pt's concerns and provided mental health resources.
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
## 2022-04-30 NOTE — Progress Notes (Signed)
Mobility Specialist - Progress Note   04/30/22 0959  Mobility  Activity Refused mobility      Pt in bed speaking to a guest in room. Said she is "busy right now" and asked me to leave. Will follow up as time allows.   Lewie Loron Mobility Specialist
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
## 2022-04-30 NOTE — Plan of Care (Signed)
Refused pain med and vitals this morning.  Problem: Pain Managment: Goal: General experience of comfort will improve Outcome: Progressing

## 2022-04-30 NOTE — Progress Notes (Signed)
OT Cancellation Note  Patient Details Name: Leslie Golden MRN: [id number] DOB: Jan 22, 1966   Cancelled Treatment:    Reason Eval/Treat Not Completed: Other (comment) Pt on phone discussing grievance reports, did not acknowledge therapist entry. Will follow up again as able.   Lorre Munroe 04/30/2022, 8:57 AM

## 2022-05-01 ENCOUNTER — Other Ambulatory Visit (HOSPITAL_COMMUNITY): Payer: Self-pay

## 2022-05-01 MED ORDER — LOSARTAN POTASSIUM 25 MG PO TABS
25.0000 mg | ORAL_TABLET | Freq: Every day | ORAL | 0 refills | Status: AC
  Filled 2022-05-01: qty 30, 30d supply, fill #0

## 2022-05-01 MED ORDER — IBUPROFEN 400 MG PO TABS
400.0000 mg | ORAL_TABLET | Freq: Once | ORAL | 0 refills | Status: AC | PRN
  Filled 2022-05-01: qty 30, 30d supply, fill #0

## 2022-05-01 MED ORDER — MIRTAZAPINE 7.5 MG PO TABS
7.5000 mg | ORAL_TABLET | Freq: Every day | ORAL | 0 refills | Status: AC
  Filled 2022-05-01: qty 12, 12d supply, fill #0

## 2022-05-01 MED ORDER — HYDROXYZINE HCL 25 MG PO TABS
25.0000 mg | ORAL_TABLET | Freq: Three times a day (TID) | ORAL | 0 refills | Status: AC | PRN
  Filled 2022-05-01: qty 30, 10d supply, fill #0

## 2022-05-01 MED ORDER — ACETAMINOPHEN 500 MG PO TABS
1000.0000 mg | ORAL_TABLET | Freq: Four times a day (QID) | ORAL | 0 refills | Status: AC
  Filled 2022-05-01: qty 60, 8d supply, fill #0

## 2022-05-01 MED ORDER — OXYCODONE HCL 5 MG PO TABS
5.0000 mg | ORAL_TABLET | ORAL | 0 refills | Status: AC | PRN
  Filled 2022-05-01: qty 30, 5d supply, fill #0

## 2022-05-01 MED ORDER — QUETIAPINE FUMARATE 25 MG PO TABS
25.0000 mg | ORAL_TABLET | Freq: Two times a day (BID) | ORAL | 0 refills | Status: AC
  Filled 2022-05-01: qty 60, 30d supply, fill #0

## 2022-05-01 MED ORDER — AMLODIPINE BESYLATE 10 MG PO TABS
10.0000 mg | ORAL_TABLET | Freq: Every day | ORAL | 0 refills | Status: AC
  Filled 2022-05-01: qty 30, 30d supply, fill #0

## 2022-05-01 MED ORDER — GABAPENTIN 100 MG PO CAPS
100.0000 mg | ORAL_CAPSULE | Freq: Every day | ORAL | 0 refills | Status: AC
  Filled 2022-05-01: qty 30, 30d supply, fill #0

## 2022-05-01 MED ORDER — PRAZOSIN HCL 1 MG PO CAPS
1.0000 mg | ORAL_CAPSULE | Freq: Every day | ORAL | 0 refills | Status: AC
  Filled 2022-05-01: qty 30, 30d supply, fill #0
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
## 2022-05-01 NOTE — TOC Transition Note (Addendum)
Transition of Care [facility]) - CM/SW Discharge Note   Patient Details  Name: Leslie Golden MRN: [id number] Date of Birth: 05-Apr-1966  [facility] CM/SW Contact:  Lynett Grimes Phone Number: 05/01/2022, 10:15 AM   Clinical Narrative:    CSW spoke with Coralie Common from [facility] to discuss transportation. Coralie Common will have a female officer escort pt to [facility] around 11am. They will assist pt with any further needs and shelter.   Pt will get TOC meds before DC.  CSW signing off.         Patient Goals and CMS Choice        Discharge Placement                       Discharge Plan and Services                                     Social Determinants of Health (SDOH) Interventions     Readmission Risk Interventions     No data to display

## 2022-05-01 NOTE — Progress Notes (Signed)
This chaplain attempted to respond to the consult for a spiritual care visit. The chaplain understands upon arrival on the unit the Pt. has discharged.   Chaplain Stephanie Acre [phone]

## 2022-05-01 NOTE — Progress Notes (Signed)
PT Cancellation Note  Patient Details Name: Leslie Golden MRN: [id number] DOB: August 20, 1966   Cancelled Treatment:    Reason Eval/Treat Not Completed: Patient declined, no reason specified. Pt reporting no questions or need for PT at this time as pt is about to d/c the hospital. If pt ends up staying longer, will plan to follow-up another day.   Raymond Gurney, PT, DPT Acute Rehabilitation Services  Office: [phone]    [name] 05/01/2022, 11:16 AM

## 2022-05-01 NOTE — TOC CM/SW Note (Signed)
Called Lacresia with [employer] for rolling walker. Dan Humphreys will be delivered to patient's room this morning.

## 2022-05-01 NOTE — Progress Notes (Addendum)
11:15- Patient left unit without receiving medications from [facility] pharmacy. States she will come back for them.   11:30 attempted to call patient at phone number listed x2 with no answer.

## 2022-05-05 ENCOUNTER — Other Ambulatory Visit (HOSPITAL_COMMUNITY): Payer: Self-pay

## 2022-05-06 ENCOUNTER — Other Ambulatory Visit (HOSPITAL_COMMUNITY): Payer: Self-pay

## 2022-08-20 ENCOUNTER — Emergency Department (HOSPITAL_COMMUNITY)
Admission: EM | Admit: 2022-08-20 | Discharge: 2022-08-21 | Disposition: A | Discharge status: Home / Self Care | Payer: Self-pay | Attending: Emergency Medicine | Admitting: Emergency Medicine

## 2022-08-20 ENCOUNTER — Encounter (HOSPITAL_COMMUNITY): Payer: Self-pay

## 2022-08-20 ENCOUNTER — Other Ambulatory Visit: Payer: Self-pay

## 2022-08-20 ENCOUNTER — Emergency Department (HOSPITAL_COMMUNITY): Payer: Self-pay

## 2022-08-20 DIAGNOSIS — R5383 Other fatigue: Secondary | ICD-10-CM | POA: Insufficient documentation

## 2022-08-20 DIAGNOSIS — R059 Cough, unspecified: Secondary | ICD-10-CM | POA: Insufficient documentation

## 2022-08-20 DIAGNOSIS — Z87891 Personal history of nicotine dependence: Secondary | ICD-10-CM | POA: Insufficient documentation

## 2022-08-20 DIAGNOSIS — Z79899 Other long term (current) drug therapy: Secondary | ICD-10-CM | POA: Insufficient documentation

## 2022-08-20 DIAGNOSIS — I10 Essential (primary) hypertension: Secondary | ICD-10-CM | POA: Insufficient documentation

## 2022-08-20 DIAGNOSIS — Z1152 Encounter for screening for COVID-19: Secondary | ICD-10-CM | POA: Insufficient documentation

## 2022-08-20 LAB — RESP PANEL BY RT-PCR (RSV, FLU A&B, COVID)  RVPGX2
Influenza A by PCR: NEGATIVE
Influenza B by PCR: NEGATIVE
Resp Syncytial Virus by PCR: NEGATIVE
SARS Coronavirus 2 by RT PCR: NEGATIVE

## 2022-08-20 LAB — BASIC METABOLIC PANEL
Anion gap: 8 (ref 5–15)
BUN: 17 mg/dL (ref 6–20)
CO2: 25 mmol/L (ref 22–32)
Calcium: 9.8 mg/dL (ref 8.9–10.3)
Chloride: 105 mmol/L (ref 98–111)
Creatinine, Ser: 1.04 mg/dL — ABNORMAL HIGH (ref 0.44–1.00)
GFR, Estimated: >60 mL/min (ref >60)
Glucose, Bld: 90 mg/dL (ref 70–99)
Potassium: 4.3 mmol/L (ref 3.5–5.1)
Sodium: 138 mmol/L (ref 135–145)

## 2022-08-20 LAB — CBC WITH DIFFERENTIAL/PLATELET
Abs Immature Granulocytes: 0.02 10*3/uL (ref 0.00–0.07)
Basophils Absolute: 0.1 10*3/uL (ref 0.0–0.1)
Basophils Relative: 1 %
Eosinophils Absolute: 0.2 10*3/uL (ref 0.0–0.5)
Eosinophils Relative: 2 %
HCT: 43.8 % (ref 36.0–46.0)
Hemoglobin: 14.1 g/dL (ref 12.0–15.0)
Immature Granulocytes: 0 %
Lymphocytes Relative: 40 %
Lymphs Abs: 3.8 10*3/uL (ref 0.7–4.0)
MCH: 26.4 pg (ref 26.0–34.0)
MCHC: 32.2 g/dL (ref 30.0–36.0)
MCV: 82 fL (ref 80.0–100.0)
Monocytes Absolute: 0.6 10*3/uL (ref 0.1–1.0)
Monocytes Relative: 6 %
Neutro Abs: 4.9 10*3/uL (ref 1.7–7.7)
Neutrophils Relative %: 51 %
Platelets: 282 10*3/uL (ref 150–400)
RBC: 5.34 MIL/uL — ABNORMAL HIGH (ref 3.87–5.11)
RDW: 15 % (ref 11.5–15.5)
WBC: 9.6 10*3/uL (ref 4.0–10.5)
nRBC: 0 % (ref 0.0–0.2)

## 2022-08-20 MED ORDER — LOSARTAN POTASSIUM 25 MG PO TABS: 25.0000 mg | ORAL_TABLET | Freq: Every day | ORAL | 1 refills | Status: DC

## 2022-08-20 MED ORDER — LOSARTAN POTASSIUM 25 MG PO TABS
25.0000 mg | ORAL_TABLET | Freq: Once | ORAL | Status: AC
  Administered 2022-08-20: 25 mg via ORAL
  Filled 2022-08-20: qty 1

## 2022-08-20 MED ORDER — LACTATED RINGERS IV BOLUS
1000.0000 mL | Freq: Once | INTRAVENOUS | Status: AC
  Administered 2022-08-20: 1000 mL via INTRAVENOUS

## 2022-08-20 MED ORDER — LOSARTAN POTASSIUM 25 MG PO TABS: 25.0000 mg | ORAL_TABLET | Freq: Every day | ORAL | 1 refills | Status: AC

## 2022-08-20 MED ORDER — DEXAMETHASONE SODIUM PHOSPHATE 10 MG/ML IJ SOLN
10.0000 mg | Freq: Once | INTRAMUSCULAR | Status: AC
  Administered 2022-08-20: 10 mg via INTRAVENOUS
  Filled 2022-08-20: qty 1

## 2022-08-20 MED ORDER — AMLODIPINE BESYLATE 5 MG PO TABS: 10.0000 mg | ORAL_TABLET | Freq: Every day | ORAL | 1 refills | Status: AC

## 2022-08-20 MED ORDER — AMLODIPINE BESYLATE 5 MG PO TABS
10.0000 mg | ORAL_TABLET | Freq: Once | ORAL | Status: AC
  Administered 2022-08-20: 10 mg via ORAL
  Filled 2022-08-20: qty 2

## 2022-08-20 MED ORDER — AMLODIPINE BESYLATE 5 MG PO TABS: 10.0000 mg | ORAL_TABLET | Freq: Every day | ORAL | 1 refills | Status: DC

## 2022-08-20 MED ORDER — KETOROLAC TROMETHAMINE 30 MG/ML IJ SOLN
30.0000 mg | Freq: Once | INTRAMUSCULAR | Status: AC
  Administered 2022-08-20: 30 mg via INTRAVENOUS
  Filled 2022-08-20: qty 1

## 2022-08-20 NOTE — Discharge Instructions (Addendum)
You were seen in the emergency department today for elevated blood pressure and concern for TB.  Your exam/workup today does not appear to show any abnormalities that would require admission. It is very important that you take all of your medications as prescribed, every day.  I have sent prescriptions for your home amlodipine and losartan.  Follow-up with your MyChart result for the QuantiFERON gold test for TB.  Your x-ray showed no pneumonia or infection.  Please follow up with your doctor in the next 2-3 days to discuss your elevated blood pressures and your current medication regimen.  Return to the emergency department if you develop any sudden/severe headache, confusion, slurred speech, worsening vision, weakness or numbness of any arm or leg, or for any chest pain, pressure, or trouble breathing.

## 2022-08-20 NOTE — ED Triage Notes (Signed)
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  Patient states she did not always have a mask on when caring for him  Patient reports that she has been having a headache since yesterday, sweats, but not necessarily at night, dry cough, fatigue, and diarrhea.

## 2022-08-20 NOTE — ED Provider Notes (Signed)
[facility]-EMERGENCY DEPT Provider Note   CSN: [id number] Arrival date & time: 08/20/22  1456     History  No chief complaint on file.   Leslie Golden is a 56 y.o. female.  With PMH of HTN, IDA, tobacco use who presents with dry cough, headache, diarrhea and fatigue in the setting of TB exposure.  Patient notes intermittent symptoms over the past 2 weeks mainly of night sweats, dry cough, congestion, rhinorrhea and generalized pressure-like headache.  She feels generally weak and has had decreased p.o. intake but no focal weakness, no paresthesias, no loss of sensation.  She has had no vomiting but endorses nonbloody diarrhea.  She has checked for fevers but is had no fevers.  She does smoke cigarettes but denies any wheezing or shortness of breath.  She has not taken any medications for her symptoms.  She recently ran out of her blood pressure medications and her last dose was about 3 days ago maybe longer.  She has no chest pain or shortness of breath.  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  HPI     Home Medications Prior to Admission medications   Medication Sig Start Date End Date Taking? Authorizing Provider  amLODipine (NORVASC) 5 MG tablet Take 2 tablets (10 mg total) by mouth daily. 08/20/22 10/19/22 Yes Mardene Sayer, MD  losartan (COZAAR) 25 MG tablet Take 1 tablet (25 mg total) by mouth daily. 08/20/22 10/19/22 Yes Mardene Sayer, MD  amLODipine (NORVASC) 10 MG tablet Take 1 tablet (10 mg total) by mouth daily. 05/01/22   Alfredo Martinez, MD  gabapentin (NEURONTIN) 100 MG capsule Take 1 capsule (100 mg total) by mouth at bedtime. 05/01/22 05/31/22  Alfredo Martinez, MD  hydrOXYzine (ATARAX) 25 MG tablet Take 1 tablet (25 mg total) by mouth 3 (three) times daily as needed. 05/01/22   Alfredo Martinez, MD  ibuprofen (ADVIL) 400 MG tablet Take 1 tablet (400 mg total) by mouth  once as needed for headache, mild pain or moderate pain. 05/01/22   Alfredo Martinez, MD  losartan (COZAAR) 25 MG tablet Take 1 tablet (25 mg total) by mouth daily. 05/01/22 05/31/22  Alfredo Martinez, MD  mirtazapine (REMERON) 7.5 MG tablet Take 1 tablet (7.5 mg total) by mouth at bedtime. 05/01/22 05/31/22  Alfredo Martinez, MD  prazosin (MINIPRESS) 1 MG capsule Take 1 capsule (1 mg total) by mouth at bedtime. 05/01/22 05/31/22  Alfredo Martinez, MD  QUEtiapine (SEROQUEL) 25 MG tablet Take 1 tablet (25 mg total) by mouth 2 (two) times daily. 05/01/22 05/31/22  Alfredo Martinez, MD      Allergies    Patient has no known allergies.    Review of Systems   Review of Systems  Physical Exam Updated Vital Signs BP (!) 174/120 (BP Location: Left Arm)   Pulse 92   Temp 98.3 F (36.8 C) (Oral)   Resp 17   Ht 5\' 6"  (1.676 m)   Wt 54.4 kg   LMP 02/21/2016   SpO2 100%   BMI 19.37 kg/m  Physical Exam Constitutional: Alert and oriented.  Mildly fatigued but no acute distress and nontoxic  eyes: Conjunctivae are normal. ENT      Head: Normocephalic and atraumatic.      Nose: + congestion.      Mouth/Throat: Mucous membranes are moist.      Neck: No stridor.  No  meningismus Cardiovascular: S1, S2,  Normal and symmetric distal pulses are present in all extremities.Warm and well perfused. Respiratory: Normal respiratory effort. Breath sounds are normal.  O2 sat 100% on room air Gastrointestinal: Soft and nontender.  Musculoskeletal: Normal range of motion in all extremities.      Right lower leg: No tenderness or edema.      Left lower leg: No tenderness or edema. Neurologic: Normal speech and language.  CN II through XII grossly intact.  Equal strength 5 out of 5 bilateral upper and lower extremities.  Sensation grossly intact.  Steady ambulatory gait.  No gross focal neurologic deficits are appreciated. Skin: Skin is warm, dry and intact. No rash noted. Psychiatric: Mood and affect are normal. Speech and behavior  are normal.  ED Results / Procedures / Treatments   Labs (all labs ordered are listed, but only abnormal results are displayed) Labs Reviewed  CBC WITH DIFFERENTIAL/PLATELET - Abnormal; Notable for the following components:      Result Value   RBC 5.34 (*)    All other components within normal limits  BASIC METABOLIC PANEL - Abnormal; Notable for the following components:   Creatinine, Ser 1.04 (*)    All other components within normal limits  RESP PANEL BY RT-PCR (RSV, FLU A&B, COVID)  RVPGX2  QUANTIFERON-TB GOLD PLUS    EKG EKG Interpretation  Date/Time:  Thursday August 20 2022 20:14:06 EST Ventricular Rate:  68 PR Interval:  203 QRS Duration: 91 QT Interval:  435 QTC Calculation: 463 R Axis:   76 Text Interpretation: Sinus rhythm Borderline prolonged PR interval Probable LVH with secondary repol abnrm ST elevation, consider inferior injury <1 mm very minimal ST elevation lead III No reciprocal ST depression Nonspecific T wave inversion avL Confirmed by Vivien Rossetti ([id number]) on 08/20/2022 8:19:37 PM  Radiology DG Chest 2 View  Result Date: 08/20/2022 CLINICAL DATA:  Cough, night sweats EXAM: CHEST - 2 VIEW COMPARISON:  04/25/2022 FINDINGS: Cardiac size is within normal limits. Low position of diaphragms suggests possible COPD. Lung fields are clear of any infiltrates or pulmonary edema. There is no pleural effusion or pneumothorax. There is no abnormal prominence of hilar regions and mediastinum. IMPRESSION: No active cardiopulmonary disease. Electronically Signed   By: Ernie Avena M.D.   On: 08/20/2022 16:51    Procedures Procedures    Medications Ordered in ED Medications  lactated ringers bolus 1,000 mL (0 mLs Intravenous Stopped 08/20/22 2014)  amLODipine (NORVASC) tablet 10 mg (10 mg Oral Given 08/20/22 1851)  ketorolac (TORADOL) 30 MG/ML injection 30 mg (30 mg Intravenous Given 08/20/22 1850)  dexamethasone (DECADRON) injection 10 mg (10 mg  Intravenous Given 08/20/22 1850)  losartan (COZAAR) tablet 25 mg (25 mg Oral Given 08/20/22 2023)    ED Course/ Medical Decision Making/ A&P Clinical Course as of 08/20/22 2034  Thu Aug 20, 2022  2028 Reassessed patient, she notes that her headache has improved with the medications.  She is still having a mild posterior headache but she has absolutely no focal deficits.  Additionally she does have uncontrolled hypertension but denying any chest pain, strokelike symptoms, shortness of breath or other concerning symptoms.Marland Kitchen  Her workup is not consistent with hypertensive emergency.  She has been noncompliant and off her losartan and amlodipine which I have ordered for today.  I discussed the importance of her taking blood pressure medications and strict follow-up with PCP and strict return precautions as I did explain the risk of increased stroke risk, heart attack  if continues to remain noncompliant. [VB]    Clinical Course User Index [VB] Mardene Sayer, MD                           Medical Decision Making Leslie Golden is a 56 y.o. female.  With PMH of HTN, IDA, tobacco use who presents with dry cough, headache, diarrhea and fatigue in the setting of TB exposure.    Overall, this is a well appearing patient presenting with mild viral-like symptoms, perhaps secondary to COVID, influenza, or any other non-specific virus specially given high prevalence of disease in surrounding community. Will swab for COVID/influenza. Also, consider possible TB due to exposure to TB or bacterial pneumonia.  Patient afebrile.  Chest x-ray obtained today which I personally reviewed no consolidation concern for pneumonia, no pulmonary edema, no pleural effusion.   Regarding the patient's headache, I suspect it is most consistent with primary headache in nature such as tension or migraine headache. I do not think CVA as the patient has no focal neurologic deficits. Additionally, I do not think SAH as the patient  did not have severe onset of pain at beginning of headache, they do not have focal neurologic deficits, and are overall non-toxic in appearance. I do not suspect meningitis with no meningismus, no fever, no rash, nontoxic appearance, and no neurologic deficits on exam.  Patient found to have persistently elevated blood pressures throughout their ED treatment period with history of hypertension and noncompliance with antihypertensives.  No evidence of progressive target organ dysfunction to suggest hypertensive emergency and acute lowering of blood pressure at this point.  Specifically,   - No evidence of acute myocardial ischemia based on EKG, absent chest pain, absent cardiopulmonary symptoms - No evidence of acute renal failure based on  creatinine 1.04 with GFR greater than 60 - No evidence of pulmonary edema based on pulmonary exam  and chest XR - No evidence of encephalopathy based on normal and stable mental status - No evidence for [facility], or other CVA based on normal neurologic exam - No evidence for aortic dissection based absent chest pain, normal  CXR, symmetric radial pulses, nontoxic appearance, no neurologic deficits  Will manage as asymptomatic hypertensive urgency.  Sent for prescriptions for home amlodipine and losartan and referral to PCP.  Discussed critical importance of follow up with PCP within 1 week and increased risk of devastating stroke, heart attack, respiratory distress, and other life threatening complications if blood pressure is not reduced appropriately.  Patient will also follow-up on QuantiFERON gold test on her MyChart.      Risk Prescription drug management.    Final Clinical Impression(s) / ED Diagnoses Final diagnoses:  Hypertension, unspecified type  Cough, unspecified type  Other fatigue    Rx / DC Orders ED Discharge Orders          Ordered    amLODipine (NORVASC) 5 MG tablet  Daily        08/20/22 2030    losartan (COZAAR) 25 MG tablet   Daily        08/20/22 2030              Mardene Sayer, MD 08/20/22 2034

## 2022-08-20 NOTE — ED Provider Triage Note (Signed)
Emergency Medicine Provider Triage Evaluation Note  Leslie Golden , a 56 y.o. female  was evaluated in triage.  Pt complains of dry cough, night sweats, headache.  Headache has been constant for the last day.  She has been having a dry cough and night sweats since she had a tuberculosis exposure with a resident at work.  She denies any nausea, vomiting, but does endorse diarrhea.   Review of Systems  Positive:  Negative: See above   Physical Exam  BP (!) 146/107 (BP Location: Left Arm)   Pulse 92   Temp 98.5 F (36.9 C) (Oral)   Resp 16   Ht 5\' 6"  (1.676 m)   Wt 54.4 kg   LMP 02/21/2016   SpO2 100%   BMI 19.37 kg/m  Gen:   Awake, no distress  Resp:  Normal effort  MSK:   Moves extremities without difficulty  Other:    Medical Decision Making  Medically screening exam initiated at 4:19 PM.  Appropriate orders placed.  TAYLIE HELDER was informed that the remainder of the evaluation will be completed by another provider, this initial triage assessment does not replace that evaluation, and the importance of remaining in the ED until their evaluation is complete.     [name] [place], [place] 08/20/22 [phone]

## 2022-08-21 NOTE — ED Notes (Signed)
Patient verbalized understanding of discharge instructions and reasons to return to the Ed.  She verbalized understanding of importance of taking her bp medications and following up with her primary care MD.

## 2022-08-22 LAB — QUANTIFERON-TB GOLD PLUS: QuantiFERON-TB Gold Plus: NEGATIVE

## 2022-08-22 LAB — QUANTIFERON-TB GOLD PLUS (RQFGPL)
QuantiFERON Mitogen Value: >10 IU/mL
QuantiFERON Nil Value: 0.01 IU/mL
QuantiFERON TB1 Ag Value: 0.02 IU/mL
QuantiFERON TB2 Ag Value: 0 IU/mL

## 2024-03-24 ENCOUNTER — Other Ambulatory Visit: Payer: Self-pay
